# Patient Record
Sex: Female | Born: 1945 | Race: Black or African American | Hispanic: No | Marital: Married | Smoking: Never smoker
Health system: Southern US, Community
[De-identification: ages and names within clinical notes are randomized; demographics above are authoritative.]

## PROBLEM LIST (undated history)

## (undated) ENCOUNTER — Emergency Department (HOSPITAL_COMMUNITY): Discharge status: Absent without leave | Payer: Medicare Other

## (undated) DIAGNOSIS — R413 Other amnesia: Secondary | ICD-10-CM

## (undated) DIAGNOSIS — F0280 Dementia in other diseases classified elsewhere without behavioral disturbance: Secondary | ICD-10-CM

## (undated) DIAGNOSIS — N189 Chronic kidney disease, unspecified: Secondary | ICD-10-CM

## (undated) DIAGNOSIS — I82411 Acute embolism and thrombosis of right femoral vein: Secondary | ICD-10-CM

## (undated) DIAGNOSIS — R51 Headache: Secondary | ICD-10-CM

## (undated) DIAGNOSIS — F0390 Unspecified dementia without behavioral disturbance: Secondary | ICD-10-CM

## (undated) DIAGNOSIS — I2699 Other pulmonary embolism without acute cor pulmonale: Secondary | ICD-10-CM

## (undated) DIAGNOSIS — G309 Alzheimer's disease, unspecified: Secondary | ICD-10-CM

## (undated) HISTORY — DX: Other pulmonary embolism without acute cor pulmonale: I26.99

## (undated) HISTORY — DX: Alzheimer's disease, unspecified: G30.9

## (undated) HISTORY — DX: Headache: R51

## (undated) HISTORY — DX: Dementia in other diseases classified elsewhere without behavioral disturbance: F02.80

## (undated) HISTORY — DX: Unspecified dementia, unspecified severity, without behavioral disturbance, psychotic disturbance, mood disturbance, and anxiety: F03.90

## (undated) HISTORY — DX: Other amnesia: R41.3

## (undated) HISTORY — DX: Acute embolism and thrombosis of right femoral vein: I82.411

---

## 1997-05-19 ENCOUNTER — Other Ambulatory Visit: Admission: RE | Admit: 1997-05-19 | Discharge: 1997-05-19 | Payer: Self-pay | Admitting: *Deleted

## 1997-05-23 ENCOUNTER — Encounter: Admission: RE | Admit: 1997-05-23 | Discharge: 1997-08-21 | Payer: Self-pay | Admitting: *Deleted

## 1998-01-12 ENCOUNTER — Encounter: Payer: Self-pay | Admitting: Emergency Medicine

## 1998-01-12 ENCOUNTER — Emergency Department (HOSPITAL_COMMUNITY): Admission: EM | Admit: 1998-01-12 | Discharge: 1998-01-12 | Payer: Self-pay | Admitting: Emergency Medicine

## 1999-04-02 ENCOUNTER — Emergency Department (HOSPITAL_COMMUNITY): Admission: EM | Admit: 1999-04-02 | Discharge: 1999-04-02 | Payer: Self-pay

## 1999-07-18 ENCOUNTER — Other Ambulatory Visit: Admission: RE | Admit: 1999-07-18 | Discharge: 1999-07-18 | Payer: Self-pay | Admitting: *Deleted

## 2000-03-25 ENCOUNTER — Emergency Department (HOSPITAL_COMMUNITY): Admission: EM | Admit: 2000-03-25 | Discharge: 2000-03-25 | Payer: Self-pay | Admitting: Emergency Medicine

## 2000-03-25 ENCOUNTER — Encounter: Payer: Self-pay | Admitting: Emergency Medicine

## 2010-02-03 ENCOUNTER — Encounter: Payer: Self-pay | Admitting: Internal Medicine

## 2013-05-03 ENCOUNTER — Ambulatory Visit (INDEPENDENT_AMBULATORY_CARE_PROVIDER_SITE_OTHER): Payer: 59 | Admitting: Family Medicine

## 2013-05-03 VITALS — BP 124/82 | HR 66 | Temp 98.0°F | Resp 18 | Ht 68.0 in | Wt 189.0 lb

## 2013-05-03 DIAGNOSIS — H543 Unqualified visual loss, both eyes: Secondary | ICD-10-CM

## 2013-05-03 NOTE — Patient Instructions (Signed)
There are 2 optometry shops in [place]- there is also an Haematologist at the [facility] on [address].  I hope this helps.

## 2013-05-03 NOTE — Progress Notes (Signed)
[facility] [address] [place] [address] [phone]  Date:  05/03/2013   Name:  Tammy [name]   DOB:  08-22-1946   MRN:  [id number]  PCP:  No primary provider on file.    Chief Complaint: Advice Only   History of Present Illness:  Tammy [name] is a 68 y.o. very pleasant female patient who presents with the following:  Tammy [name] is here today just because she lost her glasses and does not know where to get new ones. She has no other complaint today and does not have any pain.  Her husband drove her here today  There are no active problems to display for this patient.   History reviewed. No pertinent past medical history.  History reviewed. No pertinent past surgical history.  History  Substance Use Topics  . Smoking status: Never Smoker   . Smokeless tobacco: Not on file  . Alcohol Use: No    History reviewed. No pertinent family history.  No Known Allergies  Medication list has been reviewed and updated.  No current outpatient prescriptions on file prior to visit.   No current facility-administered medications on file prior to visit.    Review of Systems:  As per HPI- otherwise negative.   Physical Examination: Filed Vitals:   05/03/13 1559  BP: 124/82  Pulse: 66  Temp: 98 F (36.7 C)  Resp: 18   Filed Vitals:   05/03/13 1559  Height: 5\' 8"  (1.727 m)  Weight: 189 lb (85.73 kg)   Body mass index is 28.74 kg/(m^2). Ideal Body Weight: Weight in (lb) to have BMI = 25: 164.1   GEN: WDWN, NAD, Non-toxic, Alert & Oriented x 3 HEENT: Atraumatic, Normocephalic.  Ears and Nose: No external deformity. EXTR: No clubbing/cyanosis/edema NEURO: Normal gait.  PSYCH: Normally interactive. Conversant. Not depressed or anxious appearing.  Calm demeanor.    Assessment and Plan: Low vision, both eyes  Gave some information about optic shops close to her home.  No charge for this visit today  Signed Lamar Blinks, MD

## 2013-05-08 ENCOUNTER — Ambulatory Visit (INDEPENDENT_AMBULATORY_CARE_PROVIDER_SITE_OTHER): Payer: 59 | Admitting: Physician Assistant

## 2013-05-08 VITALS — BP 138/88 | HR 66 | Temp 98.0°F | Resp 16 | Ht 66.0 in | Wt 186.0 lb

## 2013-05-08 DIAGNOSIS — R42 Dizziness and giddiness: Secondary | ICD-10-CM

## 2013-05-08 NOTE — Progress Notes (Signed)
   Subjective:    Patient ID: Tammy [name], female    DOB: 08/14/1946, 68 y.o.   MRN: [id number]  HPI   68y.o female with no medical or surgical hx presents feeling off balance for 1 week.  Pt describes walking yesterday and she veered to the right without intention.  This has been happening on and off for past week.  She denies feeling dizzy or lightheaded when this happens.  Denies blurry or double vision, changes in acuity, tinnitus, changes in hearing, vertigo, congestion, sinus pressure.  Has had headaches on and off but this has improved since getting new glasses.  Denies feeling weakness, numbness, tingling in any extremities this past week.  This has never happened to her before.  She works on the [employer] and has been stressed by staff working under her.  Denies fever, N/V/D, blood in stool or urine, dysuria, unexpected weight change.  Denies hx of arrythmia or any cardiac hx  Review of Systems  Constitutional: Negative for fever, chills, fatigue and unexpected weight change.  HENT: Negative for congestion, ear pain, hearing loss, postnasal drip, rhinorrhea, sinus pressure and tinnitus.   Eyes: Negative for visual disturbance.  Respiratory: Negative for cough, shortness of breath and wheezing.   Cardiovascular: Negative for chest pain.  Gastrointestinal: Negative for nausea, vomiting, abdominal pain, diarrhea and blood in stool.  Endocrine: Negative.   Genitourinary: Negative.   Musculoskeletal: Positive for gait problem.  Skin: Negative for rash.  Allergic/Immunologic: Negative.   Neurological: Negative for dizziness, speech difficulty, weakness, light-headedness and numbness.  Hematological: Negative.        Objective:   Physical Exam  Constitutional: She is oriented to person, place, and time. She appears well-developed and well-nourished. No distress.  BP 138/88  Pulse 66  Temp(Src) 98 F (36.7 C) (Oral)  Resp 16  Ht 5\' 6"  (1.676 m)  Wt 186 lb  (84.369 kg)  BMI 30.04 kg/m2  SpO2 98%   HENT:  Head: Normocephalic.  Right Ear: External ear normal.  Left Ear: External ear normal.  Eyes: Conjunctivae and EOM are normal. Pupils are equal, round, and reactive to light.  Neck: Normal range of motion. No JVD present.  Cardiovascular: Normal rate, regular rhythm, normal heart sounds and intact distal pulses.  Exam reveals no gallop and no friction rub.   No murmur heard. Pulmonary/Chest: Effort normal and breath sounds normal. No respiratory distress. She has no wheezes.  Lymphadenopathy:    She has no cervical adenopathy.  Neurological: She is alert and oriented to person, place, and time. She has normal strength and normal reflexes. She displays no tremor and normal reflexes. No cranial nerve deficit or sensory deficit. She displays a negative Romberg sign. Coordination and gait normal.  Skin: Skin is warm and dry. No rash noted.  Psychiatric: She has a normal mood and affect. Her behavior is normal.          Assessment & Plan:   1. Disequilibrium Pt will call or seek emergent medical attention if symptoms worsen, or new neurological symptoms develop prior to follow up with Neurologist. - Ambulatory referral to Neurology

## 2013-05-08 NOTE — Patient Instructions (Signed)
We placed a referral for you to see a neurologist.  If you do not hear from [facility] within the next 3 days, give [place] a call.  If symptoms get worse or if you develop new symptoms like dizziness or changes in vision, call us immediately or go to the Emergency room.

## 2013-05-08 NOTE — Progress Notes (Signed)
I have examined this patient along with the student and agree. Discussed case with Dr. Tamala Julian.

## 2013-05-16 ENCOUNTER — Telehealth: Payer: Self-pay | Admitting: *Deleted

## 2013-05-16 NOTE — Telephone Encounter (Signed)
Left message on patient's vm to r/s appointment with Dr. Janann Colonel.

## 2013-05-17 ENCOUNTER — Ambulatory Visit: Payer: 59 | Admitting: Neurology

## 2013-05-23 ENCOUNTER — Ambulatory Visit: Payer: 59 | Admitting: Neurology

## 2013-06-01 ENCOUNTER — Encounter: Payer: Self-pay | Admitting: Neurology

## 2013-06-01 ENCOUNTER — Ambulatory Visit (INDEPENDENT_AMBULATORY_CARE_PROVIDER_SITE_OTHER): Payer: Commercial Managed Care - PPO | Admitting: Neurology

## 2013-06-01 VITALS — BP 121/76 | HR 74 | Ht 66.0 in | Wt 188.0 lb

## 2013-06-01 DIAGNOSIS — R269 Unspecified abnormalities of gait and mobility: Secondary | ICD-10-CM

## 2013-06-01 DIAGNOSIS — R2681 Unsteadiness on feet: Secondary | ICD-10-CM

## 2013-06-01 DIAGNOSIS — G609 Hereditary and idiopathic neuropathy, unspecified: Secondary | ICD-10-CM

## 2013-06-01 NOTE — Progress Notes (Signed)
[facility]    Provider:  Dr Janann Colonel Referring Provider: No ref. provider found Primary Care Physician:  No primary provider on file.  CC:  Difficulty walking  HPI:  Tammy [name] is a 68 y.o. female here as a referral from Dr Jacqulynn Cadet for difficulty walking. Notes symptoms started around 4-6 months ago. Symptoms came on suddenly, notes that when she walks she will veer to the right. She notes feeling off balance and unsteady. Husband notes she has had some falls. She notes episodes of vertigo described as the wall moving. Notes the episodes come and go. Is not triggered by head movements a certain way. No nausea or emesis. No focal motor or sensory changes. Notes some difficulty with word finding. Denies any dysarthria. No difficulty with eating or swallowing food.   Denies any HTN, DM, HLD. No prior TIA or stroke. Notes a family history of strokes.   Review of Systems: Out of a complete 14 system review, the patient complains of only the following symptoms, and all other reviewed systems are negative. Denies any + ROS  History   Social History  . Marital Status: Married    Spouse Name: N/A    Number of Children: N/A  . Years of Education: N/A   Occupational History  . Not on file.   Social History Main Topics  . Smoking status: Never Smoker   . Smokeless tobacco: Never Used  . Alcohol Use: No  . Drug Use: No  . Sexual Activity: Not on file   Other Topics Concern  . Not on file   Social History Narrative   Married with 3 children   Right handed   8th grade   1 cup daily    No family history on file.  No past medical history on file.  No past surgical history on file.  No current outpatient prescriptions on file.   No current facility-administered medications for this visit.    Allergies as of 06/01/2013 - Review Complete 06/01/2013  Allergen Reaction Noted  . Sulfa antibiotics Palpitations 05/08/2013    Vitals: BP 121/76  Pulse 74   Ht 5\' 6"  (1.676 m)  Wt 188 lb (85.276 kg)  BMI 30.36 kg/m2 Last Weight:  Wt Readings from Last 1 Encounters:  06/01/13 188 lb (85.276 kg)   Last Height:   Ht Readings from Last 1 Encounters:  06/01/13 5\' 6"  (1.676 m)     Physical exam: Exam: Gen: NAD, conversant Eyes: anicteric sclerae, moist conjunctivae HENT: Atraumatic, oropharynx clear Neck: Trachea midline; supple,  Lungs: CTA, no wheezing, rales, rhonic                          CV: RRR, no MRG Abdomen: Soft, non-tender;  Extremities: No peripheral edema  Skin: Normal temperature, no rash,  Psych: Appropriate affect, pleasant  Neuro: MS: AA&Ox3, appropriately interactive, normal affect   Speech: fluent w/o paraphasic error  Memory: good recent and remote recall  CN: PERRL, EOMI no nystagmus, no ptosis, sensation intact to LT V1-V3 bilat, face symmetric, no weakness, hearing grossly intact, palate elevates symmetrically, shoulder shrug 5/5 bilat,  tongue protrudes midline, no fasiculations noted.  Motor: normal bulk and tone Strength: 5/5  In all extremities  Coord: rapid alternating and point-to-point (FNF, HTS) movements intact.  Reflexes: symmetrical, bilat downgoing toes  Sens: LT intact in all extremities, decreased vibration bilateral LE  Gait: slightly wide based, unsteady, unable to tandem, wobbles but does  not fall with eyes closed   Assessment:  After physical and neurologic examination, review of laboratory studies, imaging, neurophysiology testing and pre-existing records, assessment will be reviewed on the problem list.  Plan:  Treatment plan and additional workup will be reviewed under Problem List.  1)Gait instability 2)peripheral neuropathy  68y/o woman presenting for initial evaluation of 3-4 months of gait instability. Unclear etiology, physical exam does show some signs of a peripheral neuropathy. With acute onset would have concern this may represent a CVA. Will check brain MRI/A, lab  workup for signs of peripheral neuropathy. Follow up once workup completed.   Jim Like, DO  [facility] [address] [place], [place] [address]  Phone [phone] Fax [phone]

## 2013-06-01 NOTE — Patient Instructions (Signed)
Overall you are doing fairly well but I do want to suggest a few things today:   Remember to drink plenty of fluid, eat healthy meals and do not skip any meals. Try to eat protein with a every meal and eat a healthy snack such as fruit or nuts in between meals. Try to keep a regular sleep-wake schedule and try to exercise daily, particularly in the form of walking, 20-30 minutes a day, if you can.   As far as diagnostic testing:  1)Please have some blood work done after checking out 2)I would like you to have a MRI of the brain, you will be called to schedule this  Follow up once the MRI is completed. Please call us with any interim questions, concerns, problems, updates or refill requests.   My clinical assistant and will answer any of your questions and relay your messages to me and also relay most of my messages to you.   Our phone number is [phone]. We also have an after hours call service for urgent matters and there is a physician on-call for urgent questions. For any emergencies you know to call 911 or go to the nearest emergency room

## 2013-06-02 ENCOUNTER — Other Ambulatory Visit (INDEPENDENT_AMBULATORY_CARE_PROVIDER_SITE_OTHER): Payer: Self-pay

## 2013-06-02 ENCOUNTER — Ambulatory Visit (INDEPENDENT_AMBULATORY_CARE_PROVIDER_SITE_OTHER): Payer: Commercial Managed Care - PPO

## 2013-06-02 DIAGNOSIS — Z0289 Encounter for other administrative examinations: Secondary | ICD-10-CM

## 2013-06-02 DIAGNOSIS — R269 Unspecified abnormalities of gait and mobility: Secondary | ICD-10-CM

## 2013-06-02 DIAGNOSIS — R2681 Unsteadiness on feet: Secondary | ICD-10-CM

## 2013-06-02 DIAGNOSIS — G609 Hereditary and idiopathic neuropathy, unspecified: Secondary | ICD-10-CM

## 2013-06-03 ENCOUNTER — Other Ambulatory Visit: Payer: Self-pay | Admitting: Neurology

## 2013-06-03 MED ORDER — ASPIRIN EC 81 MG PO TBEC: 81.0000 mg | DELAYED_RELEASE_TABLET | Freq: Every day | ORAL | Status: DC

## 2013-06-03 MED ORDER — ROSUVASTATIN CALCIUM 20 MG PO TABS: 20.0000 mg | ORAL_TABLET | Freq: Every day | ORAL | Status: DC

## 2013-06-03 NOTE — Progress Notes (Signed)
Quick Note:  Spoke to husband and relayed cholesterol elevated and Dr. Janann Colonel wants her to take Crestor 20mg  nightly and also take a daily aspirin 81mg , per Dr. Janann Colonel. ______

## 2013-06-05 LAB — LIPID PANEL WITH LDL/HDL RATIO
Cholesterol, Total: 186 mg/dL (ref 100–199)
HDL: 69 mg/dL (ref >39)
LDL Calculated: 108 mg/dL — ABNORMAL HIGH (ref 0–99)
LDl/HDL Ratio: 1.6 ratio units (ref 0.0–3.2)
Triglycerides: 43 mg/dL (ref 0–149)
VLDL Cholesterol Cal: 9 mg/dL (ref 5–40)

## 2013-06-05 LAB — VITAMIN B12: Vitamin B-12: 371 pg/mL (ref 211–946)

## 2013-06-05 LAB — HGB A1C W/O EAG: Hgb A1c MFr Bld: 6 % — ABNORMAL HIGH (ref 4.8–5.6)

## 2013-06-05 LAB — METHYLMALONIC ACID, SERUM: Methylmalonic Acid: 129 nmol/L (ref 0–378)

## 2013-06-07 ENCOUNTER — Other Ambulatory Visit: Payer: Self-pay | Admitting: Neurology

## 2013-06-07 DIAGNOSIS — R42 Dizziness and giddiness: Secondary | ICD-10-CM

## 2013-06-08 NOTE — Progress Notes (Signed)
Quick Note:  Spoke with patient informed her of normal MRI, and to follow up with vestibular rehab to work on her balance, patient expressed understanding and states she will call them to schedule an appointment. ______

## 2013-06-09 ENCOUNTER — Ambulatory Visit: Payer: Commercial Managed Care - PPO | Attending: Neurology | Admitting: Physical Therapy

## 2013-06-09 ENCOUNTER — Ambulatory Visit: Payer: Commercial Managed Care - PPO | Admitting: Physical Therapy

## 2013-06-09 DIAGNOSIS — IMO0001 Reserved for inherently not codable concepts without codable children: Secondary | ICD-10-CM | POA: Insufficient documentation

## 2013-06-09 DIAGNOSIS — M6281 Muscle weakness (generalized): Secondary | ICD-10-CM | POA: Diagnosis not present

## 2013-06-09 DIAGNOSIS — R42 Dizziness and giddiness: Secondary | ICD-10-CM | POA: Diagnosis not present

## 2013-06-09 DIAGNOSIS — R269 Unspecified abnormalities of gait and mobility: Secondary | ICD-10-CM | POA: Insufficient documentation

## 2014-12-19 ENCOUNTER — Ambulatory Visit (INDEPENDENT_AMBULATORY_CARE_PROVIDER_SITE_OTHER): Payer: Medicare Other | Admitting: Family Medicine

## 2014-12-19 VITALS — BP 112/82 | HR 72 | Temp 98.1°F | Resp 16 | Ht 66.0 in | Wt 178.2 lb

## 2014-12-19 DIAGNOSIS — E538 Deficiency of other specified B group vitamins: Secondary | ICD-10-CM

## 2014-12-19 DIAGNOSIS — G44219 Episodic tension-type headache, not intractable: Secondary | ICD-10-CM

## 2014-12-19 DIAGNOSIS — Z23 Encounter for immunization: Secondary | ICD-10-CM

## 2014-12-19 DIAGNOSIS — R7303 Prediabetes: Secondary | ICD-10-CM | POA: Diagnosis not present

## 2014-12-19 DIAGNOSIS — R413 Other amnesia: Secondary | ICD-10-CM

## 2014-12-19 DIAGNOSIS — E559 Vitamin D deficiency, unspecified: Secondary | ICD-10-CM

## 2014-12-19 LAB — COMPREHENSIVE METABOLIC PANEL
ALT: 14 U/L (ref 6–29)
AST: 16 U/L (ref 10–35)
Albumin: 4.1 g/dL (ref 3.6–5.1)
Alkaline Phosphatase: 54 U/L (ref 33–130)
BUN: 18 mg/dL (ref 7–25)
CO2: 27 mmol/L (ref 20–31)
Calcium: 9.6 mg/dL (ref 8.6–10.4)
Chloride: 108 mmol/L (ref 98–110)
Creat: 0.63 mg/dL (ref 0.50–0.99)
GLUCOSE: 79 mg/dL (ref 65–99)
Potassium: 3.8 mmol/L (ref 3.5–5.3)
Sodium: 145 mmol/L (ref 135–146)
Total Bilirubin: 0.4 mg/dL (ref 0.2–1.2)
Total Protein: 7.3 g/dL (ref 6.1–8.1)

## 2014-12-19 LAB — POCT GLYCOSYLATED HEMOGLOBIN (HGB A1C): Hemoglobin A1C: 5.7

## 2014-12-19 LAB — HEMOGLOBIN A1C: Hgb A1c MFr Bld: 5.7 % (ref 4.0–6.0)

## 2014-12-19 LAB — CBC
HCT: 39 % (ref 36.0–46.0)
Hemoglobin: 12.7 g/dL (ref 12.0–15.0)
MCH: 28.2 pg (ref 26.0–34.0)
MCHC: 32.6 g/dL (ref 30.0–36.0)
MCV: 86.7 fL (ref 78.0–100.0)
MPV: 9.6 fL (ref 8.6–12.4)
PLATELETS: 312 10*3/uL (ref 150–400)
RBC: 4.5 MIL/uL (ref 3.87–5.11)
RDW: 14.1 % (ref 11.5–15.5)
WBC: 6.3 10*3/uL (ref 4.0–10.5)

## 2014-12-19 MED ORDER — CYANOCOBALAMIN 1000 MCG/ML IJ SOLN
1000.0000 ug | Freq: Once | INTRAMUSCULAR | Status: AC
  Administered 2014-12-19: 1000 ug via INTRAMUSCULAR

## 2014-12-19 NOTE — Patient Instructions (Signed)
Management of Memory Problems  There are some general things you can do to help manage your memory problems.  Your memory may not in fact recover, but by using techniques and strategies you will be able to manage your memory difficulties better.  1)  Establish a routine.  Try to establish and then stick to a regular routine.  By doing this, you will get used to what to expect and you will reduce the need to rely on your memory.  Also, try to do things at the same time of day, such as taking your medication or checking your calendar first thing in the morning.  Think about think that you can do as a part of a regular routine and make a list.  Then enter them into a daily planner to remind you.  This will help you establish a routine.  2)  Organize your environment.  Organize your environment so that it is uncluttered.  Decrease visual stimulation.  Place everyday items such as keys or cell phone in the same place every day (ie.  Basket next to front door)  Use post it notes with a brief message to yourself (ie. Turn off light, lock the door)  Use labels to indicate where things go (ie. Which cupboards are for food, dishes, etc.)  Keep a notepad and pen by the telephone to take messages  3)  Memory Aids  A diary or journal/notebook/daily planner  Making a list (shopping list, chore list, to do list that needs to be done)  Using an alarm as a reminder (kitchen timer or cell phone alarm)  Using cell phone to store information (Notes, Calendar, Reminders)  Calendar/White board placed in a prominent position  Post-it notes  In order for memory aids to be useful, you need to have good habits.  It's no good remembering to make a note in your journal if you don't remember to look in it.  Try setting aside a certain time of day to look in journal.  4)  Improving mood and managing fatigue.  There may be other factors that contribute to memory difficulties.  Factors, such as anxiety,  depression and tiredness can affect memory.  Regular gentle exercise can help improve your mood and give you more energy.  Simple relaxation techniques may help relieve symptoms of anxiety  Try to get back to completing activities or hobbies you enjoyed doing in the past.  Learn to pace yourself through activities to decrease fatigue.  Find out about some local support groups where you can share experiences with others.  Try and achieve 7-8 hours of sleep at night.  Memory Compensation Strategies  2. Use "WARM" strategy.  W= write it down  A= associate it  R= repeat it  M= make a mental note  2.   You can keep a Memory Notebook.  Use a 3-ring notebook with sections for the following: calendar, important names and phone numbers,  medications, doctors' names/phone numbers, lists/reminders, and a section to journal what you did  each day.   3.    Use a calendar to write appointments down.  4.    Write yourself a schedule for the day.  This can be placed on the calendar or in a separate section of the Memory Notebook.  Keeping a  regular schedule can help memory.  5.    Use medication organizer with sections for each day or morning/evening pills.  You may need help loading it  6.      Keep a basket, or pegboard by the door.  Place items that you need to take out with you in the basket or on the pegboard.  You may also want to  include a message board for reminders.  7.    Use sticky notes.  Place sticky notes with reminders in a place where the task is performed.  For example: " turn off the  stove" placed by the stove, "lock the door" placed on the door at eye level, " take your medications" on  the bathroom mirror or by the place where you normally take your medications.  8.    Use alarms/timers.  Use while cooking to remind yourself to check on food or as a reminder to take your medicine, or as a  reminder to make a call, or as a reminder to perform another task, etc.  

## 2014-12-19 NOTE — Progress Notes (Signed)
Subjective:    Patient ID: Tammy [name], female    DOB: 08/14/1946, 69 y.o.   MRN: [id number] By signing my name below, I, Tammy [name], attest that this documentation has been prepared under the direction and in the presence of Delman Cheadle, MD.  Electronically Signed: Zola [name], Medical Scribe. 12/19/2014. 12:20 PM.  Chief Complaint  Patient presents with  . Headache  . Flu Vaccine    HPI HPI Comments: Linnell AAFIYA [name] is a 69 y.o. female who [facility] complaining of intermittent headaches to her bilateral temples. Patient attributes the headaches to working too much and states the headaches tend to occur towards the end of the day, after work. (However, she no longer works as she retired in August). She has been taking ibuprofen or Tylenol about 2-3 times a week which I I does provide relief. She reports having associated lightheadedness.   Spouse notes that she has had noticeable memory changes, often losing or misplacing things. He sometimes finds things in odd places. When trying to obtain a hx about HA from pt - pt keeps answering as if she were working still which she ha not for 4 mos - when asked what she does when she has a HA she states that she will tell her boss and take a rest from her job.  When asked how she currently spends her time, pt reports how hard her bosses make her work every day - that she has no breaks/downtime.  She states that her HAs usually ocur at the end of a work day (but no work x 4 mos though pt still having HAs currently.) Pt really unable to provide any current details about her life now. Is tangential and many answers she gives aren't related to the question I asked.  Patient does have an eye doctor at [facility]. She wears glasses and has not had any problems with her vision. She is supposesd to wear them everyday but she forgot them at home today.  Patient denies balance problems, bowel problems, nocturia, other urinary problems,  and seasonal allergies. She denies getting lost, recent falls and mood problems. She still drives a car. Patient states she drinks plenty of water, and she eats and sleeps relatively well. She had an MRI of her brain last year due to balance issues. She did not need to follow-up with neurology at that time. Her husband would like her to see a neurologist. She is amenable to seeing Dr. Janann Colonel, who she saw last year. Patient has allergies to sulfa.  Patient recently retired in August.  History reviewed. No pertinent past medical history. History reviewed. No pertinent past surgical history. Current Outpatient Prescriptions on File Prior to Visit  Medication Sig Dispense Refill  . aspirin EC 81 MG tablet Take 1 tablet (81 mg total) by mouth daily.     No current facility-administered medications on file prior to visit.   Allergies  Allergen Reactions  . Sulfa Antibiotics Palpitations   Family History  Problem Relation Age of Onset  . Heart disease Brother    Social History   Social History  . Marital Status: Married    Spouse Name: N/A  . Number of Children: N/A  . Years of Education: N/A   Social History Main Topics  . Smoking status: Never Smoker   . Smokeless tobacco: Never Used  . Alcohol Use: No  . Drug Use: No  . Sexual Activity: Not Asked  Other Topics Concern  . None   Social History Narrative   Married with 3 children   Right handed   8th grade   1 cup daily     Review of Systems  Eyes: Negative for visual disturbance.  Gastrointestinal: Negative.   Genitourinary: Negative.  Negative for urgency, frequency and enuresis.  Musculoskeletal: Negative for gait problem.  Allergic/Immunologic: Negative for environmental allergies.  Neurological: Positive for light-headedness and headaches.  Psychiatric/Behavioral: Negative for sleep disturbance and dysphoric mood. The patient is not nervous/anxious.        Objective:  BP 112/82 mmHg  Pulse 72  Temp(Src)  98.1 F (36.7 C) (Oral)  Resp 16  Ht 5\' 6"  (1.676 m)  Wt 178 lb 3.2 oz (80.831 kg)  BMI 28.78 kg/m2  SpO2 98%  Physical Exam  Constitutional: She appears well-developed and well-nourished. No distress.  HENT:  Head: Normocephalic and atraumatic.  Mouth/Throat: Oropharynx is clear and moist. No oropharyngeal exudate.  Eyes: Pupils are equal, round, and reactive to light.  Neck: Neck supple. No thyromegaly present.  Thyroid normal.  Cardiovascular: Normal rate, regular rhythm, S1 normal, S2 normal and normal heart sounds.   No murmur heard. Pulmonary/Chest: Effort normal and breath sounds normal. No respiratory distress. She has no wheezes. She has no rales.  Clear to auscultation bilaterally.   Musculoskeletal: She exhibits no edema.  Neurological: She is alert. She is not disoriented. She displays no tremor. No cranial nerve deficit. Gait normal.  Skin: Skin is warm and dry. No rash noted.  Psychiatric: She has a normal mood and affect. Her speech is normal and behavior is normal. Judgment and thought content normal. Cognition and memory are impaired. She exhibits abnormal recent memory. She exhibits normal remote memory.  Nursing note and vitals reviewed.     Results for orders placed or performed in visit on 12/19/14  CBC  Result Value Ref Range   WBC 6.3 4.0 - 10.5 K/uL   RBC 4.50 3.87 - 5.11 MIL/uL   Hemoglobin 12.7 12.0 - 15.0 g/dL   HCT 39.0 36.0 - 46.0 %   MCV 86.7 78.0 - 100.0 fL   MCH 28.2 26.0 - 34.0 pg   MCHC 32.6 30.0 - 36.0 g/dL   RDW 14.1 11.5 - 15.5 %   Platelets 312 150 - 400 K/uL   MPV 9.6 8.6 - 12.4 fL  TSH  Result Value Ref Range   TSH 0.746 0.350 - 4.500 uIU/mL  Vitamin B12  Result Value Ref Range   Vitamin B-12 356 211 - 911 pg/mL  Comprehensive metabolic panel  Result Value Ref Range   Sodium 145 135 - 146 mmol/L   Potassium 3.8 3.5 - 5.3 mmol/L   Chloride 108 98 - 110 mmol/L   CO2 27 20 - 31 mmol/L   Glucose, Bld 79 65 - 99 mg/dL   BUN 18 7 -  25 mg/dL   Creat 0.63 0.50 - 0.99 mg/dL   Total Bilirubin 0.4 0.2 - 1.2 mg/dL   Alkaline Phosphatase 54 33 - 130 U/L   AST 16 10 - 35 U/L   ALT 14 6 - 29 U/L   Total Protein 7.3 6.1 - 8.1 g/dL   Albumin 4.1 3.6 - 5.1 g/dL   Calcium 9.6 8.6 - 10.4 mg/dL  VITAMIN D 25 Hydroxy (Vit-D Deficiency, Fractures)  Result Value Ref Range   Vit D, 25-Hydroxy  30 - 100 ng/mL  Ferritin  Result Value Ref Range   Ferritin 128  10 - 291 ng/mL  RPR  Result Value Ref Range   RPR Ser Ql  NON REAC  POCT glycosylated hemoglobin (Hb A1C)  Result Value Ref Range   Hemoglobin A1C 5.7     Assessment & Plan:   1. Memory difficulties - definitely notable on attempting to obtain hx from pt even far before husband reported he was concerned about her memory and that req for neurology referral was actual reason for visit - I agree that pt's sxs seems to be fairly rapidly progressive at a relatively young age so would benefit from neuro eval and cons brain MRI.  2. Vitamin B12 deficiency  - borderline low prior at 300s with nml mma - therapeutic trial of IM inj x 1 today - if gets sig improvement, would rec starting sublingual supp in 1 mos.  3. Prediabetes - a1c 6.0 -> 5.7 today  4. Episodic tension-type headache, not intractable  - sounds MSK - suspect pt is not wearing her glasses enough (poss due to memory problems)  5. Need for prophylactic vaccination and inoculation against influenza     Orders Placed This Encounter  Procedures  . Flu Vaccine QUAD 36+ mos IM  . CBC  . TSH  . Vitamin B12  . Comprehensive metabolic panel  . VITAMIN D 25 Hydroxy (Vit-D Deficiency, Fractures)  . Ferritin  . RPR  . Ambulatory referral to Neurology    Referral Priority:  Routine    Referral Type:  Consultation    Referral Reason:  Specialty Services Required    Requested Specialty:  Neurology    Number of Visits Requested:  1  . POCT glycosylated hemoglobin (Hb A1C)    Meds ordered this encounter  Medications  .  cyanocobalamin ((VITAMIN B-12)) injection 1,000 mcg    Sig:     I personally performed the services described in this documentation, which was scribed in my presence. The recorded information has been reviewed and considered, and addended by me as needed.  Delman Cheadle, MD MPH

## 2014-12-20 LAB — VITAMIN B12: VITAMIN B 12: 356 pg/mL (ref 211–911)

## 2014-12-20 LAB — VITAMIN D 25 HYDROXY (VIT D DEFICIENCY, FRACTURES): Vit D, 25-Hydroxy: 10 ng/mL — ABNORMAL LOW (ref 30–100)

## 2014-12-20 LAB — FERRITIN: FERRITIN: 128 ng/mL (ref 10–291)

## 2014-12-20 LAB — TSH: TSH: 0.746 u[IU]/mL (ref 0.350–4.500)

## 2014-12-20 LAB — RPR

## 2014-12-28 ENCOUNTER — Ambulatory Visit (INDEPENDENT_AMBULATORY_CARE_PROVIDER_SITE_OTHER): Payer: Medicare Other | Admitting: Neurology

## 2014-12-28 ENCOUNTER — Encounter: Payer: Self-pay | Admitting: Family Medicine

## 2014-12-28 ENCOUNTER — Encounter: Payer: Self-pay | Admitting: Neurology

## 2014-12-28 VITALS — BP 149/93 | HR 69 | Ht 67.5 in | Wt 181.5 lb

## 2014-12-28 DIAGNOSIS — G309 Alzheimer's disease, unspecified: Secondary | ICD-10-CM

## 2014-12-28 DIAGNOSIS — R51 Headache: Secondary | ICD-10-CM | POA: Diagnosis not present

## 2014-12-28 DIAGNOSIS — R413 Other amnesia: Secondary | ICD-10-CM | POA: Insufficient documentation

## 2014-12-28 DIAGNOSIS — R519 Headache, unspecified: Secondary | ICD-10-CM

## 2014-12-28 DIAGNOSIS — G300 Alzheimer's disease with early onset: Secondary | ICD-10-CM | POA: Diagnosis not present

## 2014-12-28 DIAGNOSIS — F0280 Dementia in other diseases classified elsewhere without behavioral disturbance: Secondary | ICD-10-CM | POA: Insufficient documentation

## 2014-12-28 HISTORY — DX: Other amnesia: R41.3

## 2014-12-28 HISTORY — DX: Dementia in other diseases classified elsewhere, unspecified severity, without behavioral disturbance, psychotic disturbance, mood disturbance, and anxiety: F02.80

## 2014-12-28 HISTORY — DX: Headache, unspecified: R51.9

## 2014-12-28 NOTE — Progress Notes (Signed)
Reason for visit: Memory disturbance  Referring physician: Dr. Dominga Ferry Tammy [name] is a 69 y.o. female  History of present illness:  Tammy [name] is a 69 year old right-handed black female with a history of a progressive memory disturbance that her husband indicates began about 2 years ago, and has been gradually progressive. The patient worked until about August 2016. He indicates that she did not stop work because of the memory issues. She was having problems misplacing things about the house, but she also has some short-term memory problems, and difficulty with names. She has been having issues with driving, she will get turned around with directions. She is still cooking some, and doing well with this. The husband had to take over doing the finances two years ago as she was making errors in this regard. The patient was seen through this office about 18 months ago for problems with balance, but the patient indicates that she does not have issues with balance at this time. She denies any visual field disturbances, difficulty with control of the bowels or the bladder. She denies any fatigue, or any issues with depression. She is sleeping well at night. She has been in good health throughout her life. About one year ago, she began having headaches that are bitemporal in nature, these are occurring about 3 times a week. The patient will take some aspirin for the headache with good improvement. She has some blurring of vision with the headache, she denies any photophobia or phonophobia, nausea or vomiting with the headache. The patient denies any problems with confusion during the headache. She indicates that growing up she had severe headaches. The patient has had blood work done recently that included and RPR and B12 level that were unremarkable. The patient indicates that her mother also had memory issues before she died. The patient comes to this office for an evaluation.  Past Medical History    Diagnosis Date  . Memory disturbance 12/28/2014  . Headache disorder 12/28/2014  . Alzheimer's disease 12/28/2014    History reviewed. No pertinent past surgical history.  Family History  Problem Relation Age of Onset  . Heart disease Brother   . Heart attack Mother   . Dementia Mother     Social history:  reports that she has never smoked. She has never used smokeless tobacco. She reports that she does not drink alcohol or use illicit drugs.  Medications:  Prior to Admission medications   Not on File      Allergies  Allergen Reactions  . Sulfa Antibiotics Palpitations    ROS:  Out of a complete 14 system review of symptoms, the patient complains only of the following symptoms, and all other reviewed systems are negative.  Memory disturbance Headache  Blood pressure 149/93, pulse 69, height 5' 7.5" (1.715 m), weight 181 lb 8 oz (82.328 kg).  Physical Exam  General: The patient is alert and cooperative at the time of the examination.  Eyes: Pupils are equal, round, and reactive to light. Discs are flat bilaterally.  Neck: The neck is supple, no carotid bruits are noted.  Respiratory: The respiratory examination is clear.  Cardiovascular: The cardiovascular examination reveals a regular rate and rhythm, no obvious murmurs or rubs are noted.  Neuromuscular: Range of movement of the cervical spine was full.  Skin: Extremities are without significant edema.  Neurologic Exam  Mental status: The patient is alert and oriented x 2 at the time of the examination (not oriented to date).  The Mini-Mental Status Examination done today shows a total score of 14/30. The patient is able to name 5 animals in 30 seconds.  Cranial nerves: Facial symmetry is present. There is good sensation of the face to pinprick and soft touch bilaterally. The strength of the facial muscles and the muscles to head turning and shoulder shrug are normal bilaterally. Speech is well enunciated, no  aphasia or dysarthria is noted. Extraocular movements are full. Visual fields are full. The tongue is midline, and the patient has symmetric elevation of the soft palate. No obvious hearing deficits are noted.  Motor: The motor testing reveals 5 over 5 strength of all 4 extremities. Good symmetric motor tone is noted throughout.  Sensory: Sensory testing is intact to pinprick, soft touch, vibration sensation, and position sense on all 4 extremities. No evidence of extinction is noted.  Coordination: Cerebellar testing reveals good finger-nose-finger and heel-to-shin bilaterally.  Gait and station: Gait is normal. Tandem gait is normal. Romberg is negative. No drift is seen.  Reflexes: Deep tendon reflexes are symmetric and normal bilaterally. Toes are downgoing bilaterally.   Assessment/Plan:  1. Progressive memory disturbance  2. Headache  The patient likely had migraine headache when she was growing up, the headaches have come back on within the last one year. The patient will be sent for a sedimentation rate and a C-reactive protein. The patient has developed a moderate level of dementia, it appears that the memory issues have been going on at least 2 years, the patient likely has Alzheimer's disease. We will check MRI of the brain, if the workup is unremarkable we will consider adding medication such as Aricept. The patient will follow-up in 3 months.  Jill Alexanders MD 12/28/2014 7:57 PM  [facility] [address] [place], [place] [address]  Phone [phone] Fax [phone]

## 2014-12-28 NOTE — Patient Instructions (Addendum)
   We will check MRI of the brain and get some blood work today.  General Headache Without Cause A headache is pain or discomfort felt around the head or neck area. The specific cause of a headache may not be found. There are many causes and types of headaches. A few common ones are:  Tension headaches.  Migraine headaches.  Cluster headaches.  Chronic daily headaches. HOME CARE INSTRUCTIONS  Watch your condition for any changes. Take these steps to help with your condition: Managing Pain  Take over-the-counter and prescription medicines only as told by your health care provider.  Lie down in a dark, quiet room when you have a headache.  If directed, apply ice to the head and neck area:  Put ice in a plastic bag.  Place a towel between your skin and the bag.  Leave the ice on for 20 minutes, 2-3 times per day.  Use a heating pad or hot shower to apply heat to the head and neck area as told by your health care provider.  Keep lights dim if bright lights bother you or make your headaches worse. Eating and Drinking  Eat meals on a regular schedule.  Limit alcohol use.  Decrease the amount of caffeine you drink, or stop drinking caffeine. General Instructions  Keep all follow-up visits as told by your health care provider. This is important.  Keep a headache journal to help find out what may trigger your headaches. For example, write down:  What you eat and drink.  How much sleep you get.  Any change to your diet or medicines.  Try massage or other relaxation techniques.  Limit stress.  Sit up straight, and do not tense your muscles.  Do not use tobacco products, including cigarettes, chewing tobacco, or e-cigarettes. If you need help quitting, ask your health care provider.  Exercise regularly as told by your health care provider.  Sleep on a regular schedule. Get 7-9 hours of sleep, or the amount recommended by your health care provider. SEEK MEDICAL CARE  IF:   Your symptoms are not helped by medicine.  You have a headache that is different from the usual headache.  You have nausea or you vomit.  You have a fever. SEEK IMMEDIATE MEDICAL CARE IF:   Your headache becomes severe.  You have repeated vomiting.  You have a stiff neck.  You have a loss of vision.  You have problems with speech.  You have pain in the eye or ear.  You have muscular weakness or loss of muscle control.  You lose your balance or have trouble walking.  You feel faint or pass out.  You have confusion.   This information is not intended to replace advice given to you by your health care provider. Make sure you discuss any questions you have with your health care provider.   Document Released: 12/30/2004 Document Revised: 09/20/2014 Document Reviewed: 04/24/2014 Elsevier Interactive Patient Education [employer].

## 2014-12-29 LAB — SEDIMENTATION RATE: SED RATE: 3 mm/h (ref 0–40)

## 2014-12-29 LAB — C-REACTIVE PROTEIN: CRP: 1.2 mg/L (ref 0.0–4.9)

## 2015-01-01 ENCOUNTER — Telehealth: Payer: Self-pay

## 2015-01-01 NOTE — Telephone Encounter (Signed)
-----   Message from Kathrynn Ducking, MD sent at 12/29/2014  7:29 AM EST -----  The blood work results are unremarkable. Please call the patient.   ----- Message -----    From: [facility] Lab Results In Interface    Sent: 12/29/2014   5:41 AM      To: Kathrynn Ducking, MD

## 2015-01-01 NOTE — Telephone Encounter (Signed)
Called patient. Gave lab results. Patient verbalized understanding.  

## 2015-01-02 ENCOUNTER — Encounter: Payer: Self-pay | Admitting: Family Medicine

## 2015-01-02 DIAGNOSIS — E559 Vitamin D deficiency, unspecified: Secondary | ICD-10-CM | POA: Insufficient documentation

## 2015-01-02 DIAGNOSIS — R7303 Prediabetes: Secondary | ICD-10-CM | POA: Insufficient documentation

## 2015-01-02 MED ORDER — ERGOCALCIFEROL 1.25 MG (50000 UT) PO CAPS: 50000.0000 [IU] | ORAL_CAPSULE | ORAL | Status: DC

## 2015-01-02 NOTE — Addendum Note (Signed)
Addended by: Delman Cheadle on: 01/02/2015 12:45 PM   Modules accepted: Orders

## 2015-03-22 ENCOUNTER — Encounter: Payer: Self-pay | Admitting: Adult Health

## 2015-03-22 ENCOUNTER — Ambulatory Visit (INDEPENDENT_AMBULATORY_CARE_PROVIDER_SITE_OTHER): Payer: Medicare Other | Admitting: Adult Health

## 2015-03-22 VITALS — BP 135/88 | HR 69 | Ht 67.5 in | Wt 180.8 lb

## 2015-03-22 DIAGNOSIS — F0280 Dementia in other diseases classified elsewhere without behavioral disturbance: Secondary | ICD-10-CM | POA: Diagnosis not present

## 2015-03-22 DIAGNOSIS — G300 Alzheimer's disease with early onset: Secondary | ICD-10-CM

## 2015-03-22 MED ORDER — DONEPEZIL HCL 5 MG PO TABS: 5.0000 mg | ORAL_TABLET | Freq: Every day | ORAL | Status: DC

## 2015-03-22 NOTE — Patient Instructions (Signed)
Begin Aricept 5 mg at bedtime. If tolerating we can increase to 10 mg (2 tablets) in 1 month  We will continue to monitor memory If your symptoms worsen or you develop new symptoms please let us know.    Donepezil tablets What is this medicine? DONEPEZIL (doe NEP e zil) is used to treat mild to moderate dementia caused by Alzheimer's disease. This medicine may be used for other purposes; ask your health care provider or pharmacist if you have questions. What should I tell my health care provider before I take this medicine? They need to know if you have any of these conditions: -asthma or other lung disease -difficulty passing urine -head injury -heart disease -history of irregular heartbeat -liver disease -seizures (convulsions) -stomach or intestinal disease, ulcers or stomach bleeding -an unusual or allergic reaction to donepezil, other medicines, foods, dyes, or preservatives -pregnant or trying to get pregnant -breast-feeding How should I use this medicine? Take this medicine by mouth with a glass of water. Follow the directions on the prescription label. You may take this medicine with or without food. Take this medicine at regular intervals. This medicine is usually taken before bedtime. Do not take it more often than directed. Continue to take your medicine even if you feel better. Do not stop taking except on your doctor's advice. If you are taking the 23 mg donepezil tablet, swallow it whole; do not cut, crush, or chew it. Talk to your pediatrician regarding the use of this medicine in children. Special care may be needed. Overdosage: If you think you have taken too much of this medicine contact a poison control center or emergency room at once. NOTE: This medicine is only for you. Do not share this medicine with others. What if I miss a dose? If you miss a dose, take it as soon as you can. If it is almost time for your next dose, take only that dose, do not take double or  extra doses. What may interact with this medicine? Do not take this medicine with any of the following medications: -certain medicines for fungal infections like itraconazole, fluconazole, posaconazole, and voriconazole -cisapride -dextromethorphan; quinidine -dofetilide -dronedarone -pimozide -quinidine -thioridazine -ziprasidone This medicine may also interact with the following medications: -antihistamines for allergy, cough and cold -atropine -bethanechol -carbamazepine -certain medicines for bladder problems like oxybutynin, tolterodine -certain medicines for Parkinson's disease like benztropine, trihexyphenidyl -certain medicines for stomach problems like dicyclomine, hyoscyamine -certain medicines for travel sickness like scopolamine -dexamethasone -ipratropium -NSAIDs, medicines for pain and inflammation, like ibuprofen or naproxen -other medicines for Alzheimer's disease -other medicines that prolong the QT interval (cause an abnormal heart rhythm) -phenobarbital -phenytoin -rifampin, rifabutin or rifapentine This list may not describe all possible interactions. Give your health care provider a list of all the medicines, herbs, non-prescription drugs, or dietary supplements you use. Also tell them if you smoke, drink alcohol, or use illegal drugs. Some items may interact with your medicine. What should I watch for while using this medicine? Visit your doctor or health care professional for regular checks on your progress. Check with your doctor or health care professional if your symptoms do not get better or if they get worse. You may get drowsy or dizzy. Do not drive, use machinery, or do anything that needs mental alertness until you know how this drug affects you. What side effects may I notice from receiving this medicine? Side effects that you should report to your doctor or health care professional as soon as  possible: -allergic reactions like skin rash, itching or  hives, swelling of the face, lips, or tongue -changes in vision -feeling faint or lightheaded, falls -problems with balance -redness, blistering, peeling or loosening of the skin, including inside the mouth -slow heartbeat, or palpitations -stomach pain -unusual bleeding or bruising, red or purple spots on the skin -vomiting -weight loss Side effects that usually do not require medical attention (report to your doctor or health care professional if they continue or are bothersome): -diarrhea, especially when starting treatment -headache -indigestion or heartburn -loss of appetite -muscle cramps -nausea This list may not describe all possible side effects. Call your doctor for medical advice about side effects. You may report side effects to FDA at 1-800-FDA-1088. Where should I keep my medicine? Keep out of reach of children. Store at room temperature between 15 and 30 degrees C (59 and 86 degrees F). Throw away any unused medicine after the expiration date. NOTE: This sheet is a summary. It may not cover all possible information. If you have questions about this medicine, talk to your doctor, pharmacist, or health care provider.    2016, Elsevier/Gold Standard. (2013-08-11 07:51:52)

## 2015-03-22 NOTE — Progress Notes (Signed)
I have read the note, and I agree with the clinical assessment and plan.  Jaileigh Weimer KEITH   
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
## 2015-06-25 ENCOUNTER — Ambulatory Visit (INDEPENDENT_AMBULATORY_CARE_PROVIDER_SITE_OTHER): Payer: Medicare Other | Admitting: Adult Health

## 2015-06-25 ENCOUNTER — Encounter: Payer: Self-pay | Admitting: Adult Health

## 2015-06-25 VITALS — BP 112/69 | HR 70 | Ht 67.5 in | Wt 178.5 lb

## 2015-06-25 DIAGNOSIS — F0280 Dementia in other diseases classified elsewhere without behavioral disturbance: Secondary | ICD-10-CM

## 2015-06-25 DIAGNOSIS — G308 Other Alzheimer's disease: Secondary | ICD-10-CM

## 2015-06-25 MED ORDER — DONEPEZIL HCL 10 MG PO TABS: 10.0000 mg | ORAL_TABLET | Freq: Every day | ORAL | Status: DC

## 2015-06-25 NOTE — Patient Instructions (Signed)
Memory is stable Increase Aricept to 10 mg daily If your symptoms worsen or you develop new symptoms please let us know.
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
## 2015-12-19 ENCOUNTER — Telehealth: Payer: Self-pay | Admitting: Adult Health

## 2015-12-19 NOTE — Telephone Encounter (Signed)
I called the patient, talk with husband. The patient has had some episodes of wandering off, some agitation that is worse in the evenings. The patient will be seen next week.    the patient could be placed on Remeron in the evening hours to help her rest and reduce agitation. The husband will need to put dead bolts on the doors, consider getting a GPS locator  To place on the patient with a bracelet or a necklace.    we will discuss  Medical therapy next week.

## 2015-12-19 NOTE — Telephone Encounter (Signed)
The patient's husband called and states that she wondered off this morning. States that last night she left the house around 11 and he thought this was unusual so he went out and got her and brought her back in. He states this morning he got up to go the bathroom around 6 and she was in the bed. When they got up later she went out to get the paper which she normally does. However she did not come back. He states that recently she has been doing "strange things. He has issued a silver alert but was calling to make our office aware in case she shows up here.

## 2015-12-26 ENCOUNTER — Ambulatory Visit (INDEPENDENT_AMBULATORY_CARE_PROVIDER_SITE_OTHER): Payer: Medicare Other | Admitting: Neurology

## 2015-12-26 ENCOUNTER — Encounter: Payer: Self-pay | Admitting: Neurology

## 2015-12-26 VITALS — BP 157/95 | HR 62 | Ht 67.5 in | Wt 178.0 lb

## 2015-12-26 DIAGNOSIS — F0280 Dementia in other diseases classified elsewhere without behavioral disturbance: Secondary | ICD-10-CM | POA: Diagnosis not present

## 2015-12-26 DIAGNOSIS — G300 Alzheimer's disease with early onset: Secondary | ICD-10-CM

## 2015-12-26 NOTE — Progress Notes (Signed)
    Reason for visit: Alzheimer's disease  Tammy [name] is an 70 y.o. female  History of present illness:  Tammy [name] is a 70 year old right-handed black female with a history of a progressive memory disorder consistent with Alzheimer's disease. The patient last week had episodes where she would wander off, at one point she had gone for 2 hours and the husband did not know where she was. The patient at times has difficulty taking her pills at night, she will oftentimes refuse them. The patient is not having agitation or hallucinations. The patient is tolerating the Aricept, she is not having diarrhea or weight loss. The patient is brought in for an urgent evaluation.  Past Medical History:  Diagnosis Date  . Alzheimer's disease 12/28/2014  . Headache disorder 12/28/2014  . Memory disturbance 12/28/2014    History reviewed. No pertinent surgical history.  Family History  Problem Relation Age of Onset  . Heart disease Brother   . Heart attack Mother   . Dementia Mother     Social history:  reports that she has never smoked. She has never used smokeless tobacco. She reports that she does not drink alcohol or use drugs.    Allergies  Allergen Reactions  . Sulfa Antibiotics Palpitations    Medications:  Prior to Admission medications   Medication Sig Start Date End Date Taking? Authorizing Provider  donepezil (ARICEPT) 10 MG tablet Take 1 tablet (10 mg total) by mouth at bedtime. 06/25/15   Ward Givens, NP  naproxen (NAPROSYN) 500 MG tablet Take by mouth. As needed 12/24/12   Historical Provider, MD    ROS:  Out of a complete 14 system review of symptoms, the patient complains only of the following symptoms, and all other reviewed systems are negative.  Memory disturbance  Blood pressure (!) 157/95, pulse 62, height 5' 7.5" (1.715 m), weight 178 lb (80.7 kg).  Physical Exam  General: The patient is alert and cooperative at the time of the examination.  Skin: No  significant peripheral edema is noted.   Neurologic Exam  Mental status: The patient is alert and oriented x 1 at the time of the examination (not oriented to place or date). The Mini-Mental Status Examination done today shows a total score of 14/30.   Cranial nerves: Facial symmetry is present. Speech is normal, no aphasia or dysarthria is noted. Extraocular movements are full. Visual fields are full.  Motor: The patient has good strength in all 4 extremities.  Sensory examination: Soft touch sensation is symmetric on the face, arms, and legs.  Coordination: The patient has good finger-nose-finger and heel-to-shin bilaterally.  Gait and station: The patient has a normal gait. Tandem gait is normal. Romberg is negative. No drift is seen.  Reflexes: Deep tendon reflexes are symmetric.   Assessment/Plan:  1. Alzheimer's disease  The patient is having some wandering behavior. The husband is to get deadbolt locks on the doors, he may also have a GPS locator placed on the patient. The patient will continue the Aricept, there is no agitation at this time, and no indication for other medications. The husband will contact me if this issue changes. The patient will follow-up in about 6 months.  Jill Alexanders MD 12/26/2015 10:02 AM  [facility] [address] [place], [place] [address]  Phone [phone] Fax [phone]

## 2016-02-22 ENCOUNTER — Ambulatory Visit (INDEPENDENT_AMBULATORY_CARE_PROVIDER_SITE_OTHER): Payer: Medicare Other | Admitting: Family Medicine

## 2016-02-22 ENCOUNTER — Telehealth: Payer: Self-pay | Admitting: *Deleted

## 2016-02-22 VITALS — BP 120/72 | HR 100 | Temp 97.6°F | Ht 67.5 in | Wt 159.2 lb

## 2016-02-22 DIAGNOSIS — R634 Abnormal weight loss: Secondary | ICD-10-CM | POA: Diagnosis not present

## 2016-02-22 DIAGNOSIS — R630 Anorexia: Secondary | ICD-10-CM | POA: Diagnosis not present

## 2016-02-22 DIAGNOSIS — R627 Adult failure to thrive: Secondary | ICD-10-CM

## 2016-02-22 DIAGNOSIS — R4189 Other symptoms and signs involving cognitive functions and awareness: Secondary | ICD-10-CM

## 2016-02-22 DIAGNOSIS — G300 Alzheimer's disease with early onset: Secondary | ICD-10-CM

## 2016-02-22 DIAGNOSIS — F0280 Dementia in other diseases classified elsewhere without behavioral disturbance: Secondary | ICD-10-CM | POA: Diagnosis not present

## 2016-02-22 DIAGNOSIS — Z23 Encounter for immunization: Secondary | ICD-10-CM

## 2016-02-22 LAB — POCT CBC
Granulocyte percent: 68.3 %G (ref 37–80)
HCT, POC: 41.1 % (ref 37.7–47.9)
HEMOGLOBIN: 14.4 g/dL (ref 12.2–16.2)
LYMPH, POC: 1.8 (ref 0.6–3.4)
MCH, POC: 29.2 pg (ref 27–31.2)
MCHC: 35.1 g/dL (ref 31.8–35.4)
MCV: 83.3 fL (ref 80–97)
MID (cbc): 0.5 (ref 0–0.9)
MPV: 9.1 fL (ref 0–99.8)
POC Granulocyte: 4.8 (ref 2–6.9)
POC LYMPH %: 25 % (ref 10–50)
POC MID %: 6.7 % (ref 0–12)
Platelet Count, POC: 133 10*3/uL — AB (ref 142–424)
RBC: 4.94 M/uL (ref 4.04–5.48)
RDW, POC: 13.4 %
WBC: 7 10*3/uL (ref 4.6–10.2)

## 2016-02-22 LAB — POCT URINALYSIS DIP (MANUAL ENTRY)
Glucose, UA: NEGATIVE
NITRITE UA: NEGATIVE
PH UA: 5.5
Protein Ur, POC: 100 — AB
RBC UA: NEGATIVE
Spec Grav, UA: >=1.03
UROBILINOGEN UA: 1

## 2016-02-22 LAB — GLUCOSE, POCT (MANUAL RESULT ENTRY): POC GLUCOSE: 111 mg/dL — AB (ref 70–99)

## 2016-02-22 NOTE — Telephone Encounter (Signed)
Spoke to Richmond Campbell to get clarification on message. Husband thought patient had appointment today. Her symptoms have gotten worse. They do not have a follow-up until June. She had f/u 12/2015.

## 2016-02-22 NOTE — Progress Notes (Signed)
Chief Complaint  Patient presents with  . Blood Work    Pt's husband states-  pt's health is declining    HPI   Within the last couple of weeks she has been less argumentative and has stopped eating She will eat 2 or 3 spoonfuls Wt Readings from Last 3 Encounters:  02/22/16 159 lb 3.2 oz (72.2 kg)  12/26/15 178 lb (80.7 kg)  06/25/15 178 lb 8 oz (81 kg)   She has lost a lot of weight  She sleeps off and on but she is up at night  She does not take her aricept because she would wonder away from the house. She was diagnosed with Alzheimers a year ago She does not display behaviors concerning for harm to herself and others She drinks some of the ensure and only a little water After going to Neurology she stopped at the [employer] and only a little orange juice  Past Medical History:  Diagnosis Date  . Alzheimer's disease 12/28/2014  . Headache disorder 12/28/2014  . Memory disturbance 12/28/2014    Current Outpatient Prescriptions  Medication Sig Dispense Refill  . donepezil (ARICEPT) 10 MG tablet Take 1 tablet (10 mg total) by mouth at bedtime. (Patient not taking: Reported on 02/22/2016) 30 tablet 11  . naproxen (NAPROSYN) 500 MG tablet Take by mouth. As needed     No current facility-administered medications for this visit.     Allergies:  Allergies  Allergen Reactions  . Sulfa Antibiotics Palpitations    History reviewed. No pertinent surgical history.  Social History   Social History  . Marital status: Married    Spouse name: [name]  . Number of children: 3  . Years of education: 10   Occupational History  . retired    Social History Main Topics  . Smoking status: Never Smoker  . Smokeless tobacco: Never Used  . Alcohol use No  . Drug use: No  . Sexual activity: Not Asked   Other Topics Concern  . None   Social History Narrative   Married with 3 children   Right handed   8th grade   1 cup coffee daily    ROS  Objective: Vitals:   02/22/16 1120  BP: 120/72  Pulse: 100  Temp: 97.6 F (36.4 C)  TempSrc: Oral  SpO2: 97%  Weight: 159 lb 3.2 oz (72.2 kg)  Height: 5' 7.5" (1.715 m)    Physical Exam  Constitutional: She appears well-developed and well-nourished.  HENT:  Head: Normocephalic and atraumatic.  Right Ear: External ear normal.  Left Ear: External ear normal.  Nose: Nose normal.  Mouth/Throat: Oropharynx is clear and moist. No oropharyngeal exudate.  Eyes: Conjunctivae and EOM are normal.  Neck: Normal range of motion. No thyromegaly present.  Cardiovascular: Normal rate, regular rhythm, normal heart sounds and intact distal pulses.   No murmur heard. Pulmonary/Chest: Effort normal and breath sounds normal. No respiratory distress. She has no wheezes. She has no rales.  Abdominal: Soft. Bowel sounds are normal. She exhibits no distension. There is no tenderness.  Neurological: She displays normal reflexes. She exhibits normal muscle tone.  Pt knows her commonly known name of Clarise Cruz but does not know her given name. Does not know where she is. Is not aware of the date. Does not follow instructions.   Skin: Capillary refill takes less than 2 seconds. No rash noted. No erythema.    Assessment and Plan Masiyah was seen today for blood work.  Diagnoses and  all orders for this visit:  Need for prophylactic vaccination and inoculation against influenza -     Flu Vaccine QUAD 36+ mos IM  Cognitive decline- likely advancing dementia Discussed the need for follow up with Neurology but will check for underlying causes Currently stable and taking PO therefore will not send to the ER since there are no overt signs of dehydration -     POCT CBC -     POCT glucose (manual entry) -     POCT urinalysis dipstick -     Comprehensive metabolic panel -     Vitamin B12 -     Urine culture -     TSH -     EKG 12-Lead  Loss of weight- will also check other causes like thyroid and nutrition status -     POCT CBC -      POCT glucose (manual entry) -     POCT urinalysis dipstick -     Comprehensive metabolic panel -     Vitamin B12 -     Urine culture -     TSH  Anorexia- advised pt to send in calorie rich foods to imporve her interest in eating Pleasure feeds (give her what she likes) and to add ensure and things like condense -     POCT CBC -     POCT glucose (manual entry) -     POCT urinalysis dipstick -     Comprehensive metabolic panel -     Vitamin B12 -     Urine culture -     TSH  Early onset Alzheimer's dementia without behavioral disturbance- discussed that this functional decline could be due to underlying derangement or advancement of her disease Checked labs and at this point she is off aricep  Failure to thrive in adult- EKG without ischemic changes -     EKG 12-Lead   Pt evaluated today for an acute causes of rapid decline as well as a repeat check of her thyroid, b12 and her liver enzymes as well as electrolytes Discussed with her husband to give her calorie rich foods   A total of 45 minutes were spent face-to-face with the patient during this encounter and over half of that time was spent on counseling and coordination of care.  [employer]

## 2016-02-22 NOTE — Telephone Encounter (Signed)
Called husband back. He stated she quit responding, quit eating. If she goes out to eat, she will eat two or three spoonfuls. She used to be a "hell raiser" per husband. She is withdrawn now. This started about a week or so ago.   He stated they went to [facility] for sister birthday. When they got there, she started complaining that Friday night with chest pain, side hurting. They went home because of this. By the time they got back to [facility], she was feeling better.  He contacted PCP and made f/u with them on. That appt made for Monday, 02/25/16 at 11:15am. She has not complained of these symptoms since then.   However, she is mumbling her words. He feels she is more confused.  It takes her awhile to answer questions. She drinks water each day but not very much. Asked husband about symptoms of UTI, but he is not sure if she is displaying these symptoms. Advised I will speak to [facility] since Dr Jannifer Franklin out of office until Monday. They may need to bring her to urgent care/ED prior to PCP appt on Monday. Advised I will call back to advise. He verbalized understanding.

## 2016-02-22 NOTE — Telephone Encounter (Signed)
Called husband back. Advised that they should bring her to urgent care/ED to be evaluated asap for UTI, have labs done, etc asap given recent changes. He tried calling PCP to see if they could fit her in today. He was unable to get through. He will try one more time, if not he will bring her to urgent care to be evaluated.

## 2016-02-22 NOTE — Telephone Encounter (Signed)
Agree with plan. --VRP 

## 2016-02-22 NOTE — Patient Instructions (Addendum)
IF you received an x-ray today, you will receive an invoice from [facility]. Please contact [facility] at [phone] with questions or concerns regarding your invoice.   IF you received labwork today, you will receive an invoice from [facility]. Please contact [facility] at [phone] with questions or concerns regarding your invoice.   Our billing staff will not be able to assist you with questions regarding bills from these companies.  You will be contacted with the lab results as soon as they are available. The fastest way to get your results is to activate your My Chart account. Instructions are located on the last page of this paperwork. If you have not heard from [place] regarding the results in 2 weeks, please contact this office.     Failure to Thrive, Adult Introduction Failure to thrive is a group of symptoms that affect elderly adults. These symptoms include loss of appetite and weight loss. People who have this condition may do fewer and fewer activities over time. They may lose interest in being with friends or they may not want to eat or drink. This condition is not a normal part of aging. What are the causes? This condition may be caused by:  A disease, such dementia, diabetes, cancer, or lung disease.  A health problem, such as a vitamin deficiency or a heart problem.  A disorder, such as depression.  A disability.  Medicines.  Mistreatment or neglect. In some cases, the cause may not be known. What are the signs or symptoms? Symptoms of this condition include:  Loss of more than 5% of your body weight.  Being more tired than normal after an activity.  Having trouble getting up after sitting.  Loss of appetite.  Not getting out of bed.  Not wanting to do usual activities.  Depression.  Getting infections often.  Bedsores.  Taking a long time to recover after an injury or a surgery.  Weakness. How is this diagnosed? This  condition may be diagnosed with a physical exam. Your health care provider will ask questions about your health, behavior, and mood, such as:  Has your activity changed?  Do you seem sad?  Are your eating habits different? Tests may also be done. They may include:  Blood tests.  Urine tests.  Imaging tests, such as X-rays, a CT scan, or MRI.  Hearing tests.  Vision tests.  Tests to check thinking ability (cognitive tests).  Activity tests to see if you can do tasks such as bathing and dressing and to see if you can move around safely. You may be referred to a specialist. How is this treated? Treatment for this condition depends on the cause. It may involve:  Treating the cause.  Talk therapy or medicine to treat depression.  Improving diet, such as by eating more often or taking nutritional supplements.  Changing or stopping a medicine.  Physical therapy. It often takes a team of health care providers to find the right treatment. Follow these instructions at home:  Take over-the-counter and prescription medicines only as told by your health care provider.  Eat a healthy, well-balanced diet. Make sure to get enough calories in each meal.  Be physically active. Include strength training as part of your exercise routine. A physical therapist can help to set up an exercise program that fits you.  Make sure that you are safe at home.  Make sure that you have a plan for what to do if you become unable to make  decisions for yourself. Contact a health care provider if:  You are not able to eat well.  You are not able to move around.  You feel very sad or hopeless. Get help right away if:  You have thoughts of ending your life.  You cannot eat or drink.  You do not get out of bed.  Staying at home is no longer safe.  You have a fever. This information is not intended to replace advice given to you by your health care provider. Make sure you discuss any  questions you have with your health care provider. Document Released: 03/24/2011 Document Revised: 06/07/2015 Document Reviewed: 03/27/2014  2017 Elsevier

## 2016-02-23 ENCOUNTER — Encounter (HOSPITAL_COMMUNITY): Payer: Self-pay | Admitting: Emergency Medicine

## 2016-02-23 ENCOUNTER — Emergency Department (HOSPITAL_COMMUNITY): Payer: Medicare Other

## 2016-02-23 ENCOUNTER — Inpatient Hospital Stay (HOSPITAL_COMMUNITY)
Admission: EM | Admit: 2016-02-23 | Discharge: 2016-02-25 | DRG: 176 | Disposition: A | Discharge status: Home / Self Care | Payer: Medicare Other | Attending: Family Medicine | Admitting: Family Medicine

## 2016-02-23 DIAGNOSIS — R079 Chest pain, unspecified: Secondary | ICD-10-CM | POA: Diagnosis not present

## 2016-02-23 DIAGNOSIS — I2699 Other pulmonary embolism without acute cor pulmonale: Secondary | ICD-10-CM

## 2016-02-23 DIAGNOSIS — N390 Urinary tract infection, site not specified: Secondary | ICD-10-CM | POA: Diagnosis not present

## 2016-02-23 DIAGNOSIS — F0280 Dementia in other diseases classified elsewhere without behavioral disturbance: Secondary | ICD-10-CM | POA: Diagnosis present

## 2016-02-23 DIAGNOSIS — G300 Alzheimer's disease with early onset: Secondary | ICD-10-CM | POA: Diagnosis not present

## 2016-02-23 DIAGNOSIS — N838 Other noninflammatory disorders of ovary, fallopian tube and broad ligament: Secondary | ICD-10-CM | POA: Diagnosis present

## 2016-02-23 DIAGNOSIS — G309 Alzheimer's disease, unspecified: Secondary | ICD-10-CM | POA: Diagnosis present

## 2016-02-23 DIAGNOSIS — N3000 Acute cystitis without hematuria: Secondary | ICD-10-CM | POA: Diagnosis present

## 2016-02-23 DIAGNOSIS — Z79899 Other long term (current) drug therapy: Secondary | ICD-10-CM

## 2016-02-23 DIAGNOSIS — I82411 Acute embolism and thrombosis of right femoral vein: Secondary | ICD-10-CM | POA: Diagnosis present

## 2016-02-23 DIAGNOSIS — R109 Unspecified abdominal pain: Secondary | ICD-10-CM

## 2016-02-23 DIAGNOSIS — N839 Noninflammatory disorder of ovary, fallopian tube and broad ligament, unspecified: Secondary | ICD-10-CM | POA: Diagnosis present

## 2016-02-23 DIAGNOSIS — R634 Abnormal weight loss: Secondary | ICD-10-CM | POA: Diagnosis present

## 2016-02-23 DIAGNOSIS — I82431 Acute embolism and thrombosis of right popliteal vein: Secondary | ICD-10-CM | POA: Diagnosis present

## 2016-02-23 DIAGNOSIS — E876 Hypokalemia: Secondary | ICD-10-CM | POA: Diagnosis present

## 2016-02-23 DIAGNOSIS — I2782 Chronic pulmonary embolism: Secondary | ICD-10-CM | POA: Diagnosis not present

## 2016-02-23 HISTORY — DX: Other pulmonary embolism without acute cor pulmonale: I26.99

## 2016-02-23 LAB — CBC
HCT: 41 % (ref 36.0–46.0)
HEMOGLOBIN: 13.9 g/dL (ref 12.0–15.0)
MCH: 28.7 pg (ref 26.0–34.0)
MCHC: 33.9 g/dL (ref 30.0–36.0)
MCV: 84.7 fL (ref 78.0–100.0)
PLATELETS: 130 10*3/uL — AB (ref 150–400)
RBC: 4.84 MIL/uL (ref 3.87–5.11)
RDW: 13.6 % (ref 11.5–15.5)
WBC: 7.3 10*3/uL (ref 4.0–10.5)

## 2016-02-23 LAB — COMPREHENSIVE METABOLIC PANEL
ALK PHOS: 56 U/L (ref 38–126)
ALT: 6 IU/L (ref 0–32)
ALT: 11 U/L — AB (ref 14–54)
AST: 14 IU/L (ref 0–40)
AST: 18 U/L (ref 15–41)
Albumin/Globulin Ratio: 1.3 (ref 1.2–2.2)
Albumin: 4.4 g/dL (ref 3.6–4.8)
Albumin: 4.2 g/dL (ref 3.5–5.0)
Alkaline Phosphatase: 63 IU/L (ref 39–117)
Anion gap: 11 (ref 5–15)
BUN/Creatinine Ratio: 25 (ref 12–28)
BUN: 20 mg/dL (ref 8–27)
BUN: 20 mg/dL (ref 6–20)
Bilirubin Total: 0.5 mg/dL (ref 0.0–1.2)
CALCIUM: 9.9 mg/dL (ref 8.7–10.3)
CALCIUM: 9.5 mg/dL (ref 8.9–10.3)
CHLORIDE: 98 mmol/L (ref 96–106)
CO2: 25 mmol/L (ref 18–29)
CO2: 28 mmol/L (ref 22–32)
CREATININE: 0.86 mg/dL (ref 0.44–1.00)
Chloride: 103 mmol/L (ref 101–111)
Creatinine, Ser: 0.79 mg/dL (ref 0.57–1.00)
GFR, EST AFRICAN AMERICAN: 89 mL/min/{1.73_m2} (ref >59)
GFR, EST NON AFRICAN AMERICAN: 77 mL/min/{1.73_m2} (ref >59)
GLUCOSE: 107 mg/dL — AB (ref 65–99)
Globulin, Total: 3.5 g/dL (ref 1.5–4.5)
Glucose, Bld: 131 mg/dL — ABNORMAL HIGH (ref 65–99)
POTASSIUM: 3.4 mmol/L — AB (ref 3.5–5.2)
Potassium: 2.9 mmol/L — ABNORMAL LOW (ref 3.5–5.1)
SODIUM: 142 mmol/L (ref 135–145)
Sodium: 143 mmol/L (ref 134–144)
TOTAL PROTEIN: 7.9 g/dL (ref 6.0–8.5)
Total Bilirubin: 0.6 mg/dL (ref 0.3–1.2)
Total Protein: 8.1 g/dL (ref 6.5–8.1)

## 2016-02-23 LAB — URINALYSIS, ROUTINE W REFLEX MICROSCOPIC
Bilirubin Urine: NEGATIVE
GLUCOSE, UA: NEGATIVE mg/dL
Ketones, ur: 20 mg/dL — AB
NITRITE: NEGATIVE
PH: 5 (ref 5.0–8.0)
Protein, ur: 30 mg/dL — AB
SPECIFIC GRAVITY, URINE: 1.03 (ref 1.005–1.030)

## 2016-02-23 LAB — I-STAT CG4 LACTIC ACID, ED: Lactic Acid, Venous: 1.65 mmol/L (ref 0.5–1.9)

## 2016-02-23 LAB — URINE CULTURE

## 2016-02-23 LAB — I-STAT TROPONIN, ED
TROPONIN I, POC: 0 ng/mL (ref 0.00–0.08)
TROPONIN I, POC: 0 ng/mL (ref 0.00–0.08)
TROPONIN I, POC: 0.01 ng/mL (ref 0.00–0.08)

## 2016-02-23 LAB — HEPARIN LEVEL (UNFRACTIONATED): Heparin Unfractionated: 0.99 IU/mL — ABNORMAL HIGH (ref 0.30–0.70)

## 2016-02-23 LAB — D-DIMER, QUANTITATIVE: D-Dimer, Quant: >20 ug/mL-FEU — ABNORMAL HIGH (ref 0.00–0.50)

## 2016-02-23 LAB — LIPASE, BLOOD: Lipase: 41 U/L (ref 11–51)

## 2016-02-23 LAB — VITAMIN B12: Vitamin B-12: 626 pg/mL (ref 232–1245)

## 2016-02-23 LAB — TSH: TSH: 1.18 u[IU]/mL (ref 0.450–4.500)

## 2016-02-23 MED ORDER — ACETAMINOPHEN 650 MG RE SUPP: 650.0000 mg | Freq: Four times a day (QID) | RECTAL | Status: DC | PRN

## 2016-02-23 MED ORDER — HEPARIN BOLUS VIA INFUSION
5000.0000 [IU] | Freq: Once | INTRAVENOUS | Status: AC
Start: 2016-02-23 — End: 2016-02-23
  Administered 2016-02-23: 5000 [IU] via INTRAVENOUS
  Filled 2016-02-23: qty 5000

## 2016-02-23 MED ORDER — SODIUM CHLORIDE 0.9 % IV BOLUS (SEPSIS)
1000.0000 mL | Freq: Once | INTRAVENOUS | Status: AC
  Administered 2016-02-23: 1000 mL via INTRAVENOUS

## 2016-02-23 MED ORDER — CEPHALEXIN 500 MG PO CAPS
500.0000 mg | ORAL_CAPSULE | Freq: Once | ORAL | Status: AC
  Administered 2016-02-23: 500 mg via ORAL
  Filled 2016-02-23: qty 1

## 2016-02-23 MED ORDER — IOPAMIDOL (ISOVUE-370) INJECTION 76%
100.0000 mL | Freq: Once | INTRAVENOUS | Status: AC | PRN
  Administered 2016-02-23: 80 mL via INTRAVENOUS

## 2016-02-23 MED ORDER — IOPAMIDOL (ISOVUE-370) INJECTION 76%
INTRAVENOUS | Status: AC
  Filled 2016-02-23: qty 100

## 2016-02-23 MED ORDER — IOPAMIDOL (ISOVUE-300) INJECTION 61%
INTRAVENOUS | Status: AC
  Filled 2016-02-23: qty 100

## 2016-02-23 MED ORDER — HEPARIN (PORCINE) IN NACL 100-0.45 UNIT/ML-% IJ SOLN
1050.0000 [IU]/h | INTRAMUSCULAR | Status: DC
  Administered 2016-02-23: 1200 [IU]/h via INTRAVENOUS
  Administered 2016-02-24: 1050 [IU]/h via INTRAVENOUS
  Filled 2016-02-23 (×2): qty 250

## 2016-02-23 MED ORDER — POTASSIUM CHLORIDE CRYS ER 20 MEQ PO TBCR
40.0000 meq | EXTENDED_RELEASE_TABLET | Freq: Once | ORAL | Status: AC
  Administered 2016-02-23: 40 meq via ORAL
  Filled 2016-02-23: qty 2

## 2016-02-23 MED ORDER — IOPAMIDOL (ISOVUE-300) INJECTION 61%
100.0000 mL | Freq: Once | INTRAVENOUS | Status: AC | PRN
  Administered 2016-02-23: 100 mL via INTRAVENOUS

## 2016-02-23 MED ORDER — SODIUM CHLORIDE 0.9 % IV SOLN
Freq: Once | INTRAVENOUS | Status: AC
  Administered 2016-02-23: 20:00:00 via INTRAVENOUS
  Filled 2016-02-23: qty 1000

## 2016-02-23 MED ORDER — ACETAMINOPHEN 325 MG PO TABS: 650.0000 mg | ORAL_TABLET | Freq: Four times a day (QID) | ORAL | Status: DC | PRN

## 2016-02-23 MED ORDER — CEPHALEXIN 500 MG PO CAPS
500.0000 mg | ORAL_CAPSULE | Freq: Two times a day (BID) | ORAL | Status: DC
  Administered 2016-02-23: 500 mg via ORAL
  Filled 2016-02-23: qty 1

## 2016-02-23 NOTE — ED Notes (Signed)
Pt being assisted with Danielle NT to restroom via wheelchair and stand by assist. Pt verbalizes dizziness with standing. Provider aware.

## 2016-02-23 NOTE — ED Triage Notes (Signed)
Pt BIB husband for possible abdominal pain; pt has Alzheimer's and has been rubbing and pointing to her abdomen; when asked how she feels, pt holds abdomen and states "I feel funny"; husband denies N/V/D; husband states pt had abdominal pain 1 week ago that resolved; pt seen at Urgent Care yesterday

## 2016-02-23 NOTE — ED Notes (Signed)
Thornton at bedside attempting US guided IV.

## 2016-02-23 NOTE — ED Provider Notes (Signed)
[facility] DEPT Provider Note   CSN: [id number] Arrival date & time: 02/23/16  [id number]     History   Chief Complaint Chief Complaint  Patient presents with  . Abdominal Pain    HPI Tammy [name] is a 71 y.o. female with a past medical history significant for Alzheimer's dementia who presents with abdominal pain, decreased oral intake, and fatigue. Patient is accompanied by husband who reports that for the last month, the patient has been more forgetful and has had decrease in mental status. He says this is a gradual change. He says that she has also lost approximately 18 pounds since December. He brings her in today because over the last 2 days, she has had intermittent severe abdominal pain. She is unable to describe the discomfort but he says she grabs her right upper quadrant and is in significant pain intermittently. This happened at 4 AM this morning prompting him to bring her in for evaluation. He took her to urgent care yesterday where she reportedly had a workup that was negative.   Patient denies fevers, chills, conservation, diarrhea, change in urination, cough, congestion, rhinorrhea, or chest pain. She has not had any nausea or vomiting.     HPI  Past Medical History:  Diagnosis Date  . Alzheimer's disease 12/28/2014  . Headache disorder 12/28/2014  . Memory disturbance 12/28/2014    Patient Active Problem List   Diagnosis Date Noted  . Vitamin D deficiency 01/02/2015  . Prediabetes 01/02/2015  . Memory disturbance 12/28/2014  . Headache disorder 12/28/2014  . Alzheimer's disease 12/28/2014    History reviewed. No pertinent surgical history.  OB History    No data available       Home Medications    Prior to Admission medications   Medication Sig Start Date End Date Taking? Authorizing Provider  donepezil (ARICEPT) 10 MG tablet Take 1 tablet (10 mg total) by mouth at bedtime. Patient not taking: Reported on 02/22/2016 06/25/15   Ward Givens, NP    naproxen (NAPROSYN) 500 MG tablet Take by mouth. As needed 12/24/12   Historical Provider, MD    Family History Family History  Problem Relation Age of Onset  . Heart disease Brother   . Heart attack Mother   . Dementia Mother     Social History Social History  Substance Use Topics  . Smoking status: Never Smoker  . Smokeless tobacco: Never Used  . Alcohol use No     Allergies   Sulfa antibiotics   Review of Systems Review of Systems  Constitutional: Positive for appetite change, fatigue and unexpected weight change. Negative for chills, diaphoresis and fever.  HENT: Negative for congestion and rhinorrhea.   Respiratory: Negative for cough, chest tightness, shortness of breath, wheezing and stridor.   Cardiovascular: Negative for chest pain, palpitations and leg swelling.  Gastrointestinal: Positive for abdominal pain. Negative for constipation, diarrhea, nausea and vomiting.  Genitourinary: Negative for dysuria and flank pain.  Musculoskeletal: Negative for neck pain and neck stiffness.  Neurological: Negative for syncope and headaches.  Psychiatric/Behavioral: Negative for agitation and confusion.  All other systems reviewed and are negative.    Physical Exam Updated Vital Signs BP 118/91   Pulse 102   Temp 98.3 F (36.8 C) (Oral)   Resp 18   SpO2 96%   Physical Exam  Constitutional: She appears well-developed and well-nourished. No distress.  HENT:  Head: Normocephalic and atraumatic.  Mouth/Throat: Oropharynx is clear and moist. No oropharyngeal exudate.  Eyes: Conjunctivae and  EOM are normal. Pupils are equal, round, and reactive to light.  Neck: Normal range of motion. Neck supple.  Cardiovascular: Normal rate, regular rhythm and intact distal pulses.   No murmur heard. Pulmonary/Chest: Effort normal and breath sounds normal. No stridor. No respiratory distress. She has no wheezes. She exhibits no tenderness.  Abdominal: Soft. There is no tenderness.   Musculoskeletal: She exhibits no edema.  Neurological: She is alert. No cranial nerve deficit or sensory deficit. She exhibits normal muscle tone.  Skin: Skin is warm and dry. Capillary refill takes less than 2 seconds.  Psychiatric: She has a normal mood and affect.  Nursing note and vitals reviewed.    ED Treatments / Results  Labs (all labs ordered are listed, but only abnormal results are displayed) Labs Reviewed  COMPREHENSIVE METABOLIC PANEL - Abnormal; Notable for the following:       Result Value   Potassium 2.9 (*)    Glucose, Bld 131 (*)    ALT 11 (*)    All other components within normal limits  CBC - Abnormal; Notable for the following:    Platelets 130 (*)    All other components within normal limits  URINALYSIS, ROUTINE W REFLEX MICROSCOPIC - Abnormal; Notable for the following:    Color, Urine AMBER (*)    APPearance CLOUDY (*)    Hgb urine dipstick SMALL (*)    Ketones, ur 20 (*)    Protein, ur 30 (*)    Leukocytes, UA LARGE (*)    Bacteria, UA RARE (*)    Squamous Epithelial / LPF 6-30 (*)    All other components within normal limits  D-DIMER, QUANTITATIVE (NOT AT [facility]) - Abnormal; Notable for the following:    D-Dimer, Quant >20.00 (*)    All other components within normal limits  LIPASE, BLOOD  HEPARIN LEVEL (UNFRACTIONATED)  I-STAT TROPOININ, ED  I-STAT CG4 LACTIC ACID, ED  I-STAT TROPOININ, ED  I-STAT TROPOININ, ED  Randolm Idol, ED    EKG  EKG Interpretation  Date/Time:  Saturday February 23 2016 09:05:48 EST Ventricular Rate:  83 PR Interval:    QRS Duration: 92 QT Interval:  381 QTC Calculation: 448 R Axis:   57 Text Interpretation:  Sinus rhythm Low voltage, precordial leads No prior ECG for comparison No STEMI Confirmed by Sherry Ruffing MD, CHRISTOPHER [phone]) on 02/23/2016 10:55:15 AM       Radiology Dg Chest 2 View  Result Date: 02/23/2016 CLINICAL DATA:  Right upper quadrant abdominal pain EXAM: CHEST  2 VIEW COMPARISON:  None.  FINDINGS: Heart and mediastinal contours are within normal limits. Nodular density projects laterally over the right mid lung. Lungs otherwise clear. No effusions or acute bony abnormality. IMPRESSION: Questionable right peripheral mid lung nodule. This could be further evaluated with noncontrast chest CT. Electronically Signed   By: Rolm Baptise M.D.   On: 02/23/2016 08:30   Ct Angio Chest Pe W And/or Wo Contrast  Result Date: 02/23/2016 CLINICAL DATA:  Positive D-dimer.  Pain under ribs. EXAM: CT ANGIOGRAPHY CHEST WITH CONTRAST TECHNIQUE: Multidetector CT imaging of the chest was performed using the standard protocol during bolus administration of intravenous contrast. Multiplanar CT image reconstructions and MIPs were obtained to evaluate the vascular anatomy. CONTRAST:  80 mL of Isovue 370 COMPARISON:  Chest x-ray from earlier today FINDINGS: Cardiovascular: Multiple bilateral pulmonary emboli are identified involving segmental branches in all lobes. The maximum cross-sectional dimension of the left ventricle is 4.3 cm. This compares to 3.7 cm  in the right ventricle. No evidence of heart strain identified. The heart is normal in size. The thoracic aorta is normal in caliber with no dissection. Mediastinum/Nodes: No enlarged mediastinal, hilar, or axillary lymph nodes. Thyroid gland, trachea, and esophagus demonstrate no significant findings. Lungs/Pleura: The central airways are within normal limits. No pneumothorax. The possible nodule in the right lung on the recent chest x-ray correlates with a sclerotic lesion in a right posterior rib on series 8, image 52. No suspicious pulmonary nodules, masses, or infiltrates. Upper Abdomen: Multiple liver cysts. No other abnormalities in the upper abdomen. Musculoskeletal: No acute bony abnormalities. Review of the MIP images confirms the above findings. IMPRESSION: 1. Multiple segmental branch pulmonary emboli involving all lobes. No evidence of heart strain. 2. The  possible lung nodule seen on the recent chest x-ray correlates with a sclerotic lesion in a right posterior rib, indeterminate but possibly a bone island. Findings discussed with Dr. Sherry Ruffing Electronically Signed   By: Dorise Bullion III M.D   On: 02/23/2016 13:48   Ct Abdomen Pelvis W Contrast  Result Date: 02/23/2016 CLINICAL DATA:  Patient with alzheimer's disease. Husband reports possible abdominal pain today. EXAM: CT ABDOMEN AND PELVIS WITH CONTRAST TECHNIQUE: Multidetector CT imaging of the abdomen and pelvis was performed using the standard protocol following bolus administration of intravenous contrast. CONTRAST:  168mL ISOVUE-300 IOPAMIDOL (ISOVUE-300) INJECTION 61% COMPARISON:  None. FINDINGS: Lower chest: No acute abnormality. Hepatobiliary: Multiple hypodense, fluid attenuating right hepatic masses with the largest measuring 3.1 x 2.8 cm consistent with cysts. No other focal liver abnormality is seen. No gallstones, gallbladder wall thickening, or biliary dilatation. Pancreas: Unremarkable. No pancreatic ductal dilatation or surrounding inflammatory changes. Spleen: Normal in size without focal abnormality. Adrenals/Urinary Tract: Adrenal glands are unremarkable. Kidneys are normal, without renal calculi, focal lesion, or hydronephrosis. Bladder is unremarkable. Stomach/Bowel: Stomach is within normal limits. Appendix appears normal. No evidence of bowel wall thickening, distention, or inflammatory changes. Diverticulosis without evidence of diverticulitis. Vascular/Lymphatic: No significant vascular findings are present. No enlarged abdominal or pelvic lymph nodes. Reproductive: Normal uterus. Bilobed fat attenuating right lower pelvic mass measuring approximately 4.8 x 4.8 cm with the smaller fat nodule demonstrating peripheral calcification. Larger fatty mass has a 18 mm soft tissue mural nodule or ovarian tissue. This may reflect a teratoma or dermoid. Other: No abdominal wall hernia or  abnormality. No abdominopelvic ascites. Musculoskeletal: No acute osseous abnormality. No lytic or sclerotic osseous lesion. IMPRESSION: 1. No acute abdominal or pelvic pathology. 2. Fat attenuating right lower pelvic mass likely arising from the ovary. 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 Electronically Signed   By: Kathreen Devoid   On: 02/23/2016 08:45    Procedures Procedures (including critical care time)  CRITICAL CARE Performed by: Gwenyth Allegra Tegeler Total critical care time: 35 minutes Critical care time was exclusive of separately billable procedures and treating other patients. Critical care was necessary to treat or prevent imminent or life-threatening deterioration. Critical care was time spent personally by me on the following activities: development of treatment plan with patient and/or surrogate as well as nursing, discussions with consultants, evaluation of patient's response to treatment, examination of patient, obtaining history from patient or surrogate, ordering and performing treatments and interventions, ordering and review of laboratory studies, ordering and review of radiographic studies, pulse oximetry and re-evaluation of patient's condition.   Medications Ordered in ED Medications  iopamidol (ISOVUE-300) 61 % injection (not administered)  iopamidol (ISOVUE-370) 76 % injection (not administered)  heparin ADULT infusion 100 units/mL (25000 units/240mL sodium chloride 0.45%) (1,200 Units/hr Intravenous New Bag/Given 02/23/16 1509)  sodium chloride 0.9 % bolus 1,000 mL (0 mLs Intravenous Stopped 02/23/16 1110)  potassium chloride SA (K-DUR,KLOR-CON) CR tablet 40 mEq (40 mEq Oral Given 02/23/16 0853)  iopamidol (ISOVUE-300) 61 % injection 100 mL (100 mLs Intravenous Contrast Given 02/23/16 0827)  sodium chloride 0.9 % bolus 1,000 mL (0 mLs Intravenous Stopped 02/23/16 1211)  iopamidol (ISOVUE-370) 76 % injection 100 mL (80 mLs Intravenous Contrast Given 02/23/16  1326)  cephALEXin (KEFLEX) capsule 500 mg (500 mg Oral Given 02/23/16 1510)  heparin bolus via infusion 5,000 Units (5,000 Units Intravenous Bolus from Bag 02/23/16 1509)     Initial Impression / Assessment and Plan / ED Course  I have reviewed the triage vital signs and the nursing notes.  Pertinent labs & imaging results that were available during my care of the patient were reviewed by me and considered in my medical decision making (see chart for details).     Tammy [name] is a 71 y.o. female with a past medical history significant for Alzheimer's dementia who presents with abdominal pain, decreased oral intake, and fatigue.  History and exam are seen above.  On exam, patient's abdomen is nontender to palpation. Patient's lungs are clear. Patient's back is nontender and no CVA tenderness. Patient is oriented to person but not to place or time. No focal neurologic deficits were seen.  Based on the report of intermittent severe abdominal pain, patient will have workup to look for occult infection. With the decrease in intake, patient we given fluids during workup. Anticipate reassessment following testing.   Patient's poor testing showed a hypokalemia of 2.9. Oral potassium supplementation was ordered. Lactic acid was initially negative at 1.65. Troponin negative. Lipase negative. Analysis showed evidence of UTI with leukocytes and bacteria in the setting of abdominal discomfort. Patient treated with Keflex. No evidence of leukocytosis or anemia.  CT scan of the abdomen and pelvis shows concern for a small pelvic mass likely from the ovary. Radiology was called and they recommended GYN follow-up and no acute abnormality present. Patient was reassessed and she continued to have some tachycardia. Husband reports that he thinks the pain may be in the right lower chest under the ribs. With the tachycardia and possible chest discomfort, patient will have a d-dimer.  D-dimer returned at greater  than 20.   CT PE study ordered showing evidence of segmental pulmonary emboli in all segments of the lungs. She will be started on heparin and admitted to the hospitalist stepdown service for further management.   Final Clinical Impressions(s) / ED Diagnoses   Final diagnoses:  Abdominal pain, unspecified abdominal location  Other acute pulmonary embolism without acute cor pulmonale (HCC)  Acute cystitis without hematuria     Clinical Impression: 1. Abdominal pain, unspecified abdominal location   2. Other acute pulmonary embolism without acute cor pulmonale ([place])   3. Acute cystitis without hematuria     Disposition: Admit to hospitalist stepdown service    Courtney Paris, MD 02/24/16 0104

## 2016-02-23 NOTE — H&P (Signed)
History and Physical    Tammy [name] [id number] DOB: 08/14/1946  DOA: 02/23/2016 PCP: Delman Cheadle, MD  Patient coming from: Home   Chief Complaint: Lower r chest pain   HPI: Tammy [name] is a 71 y.o. female with medical history significant of Alzheimer's disease, sent to the ED complaining of lower right chest pain below the right breast for the past 2 days. Patient was seen 3 days ago in urgent care for the same labs were normal and was sent home with pain medication. This morning she started to have an intense persistent pain that did not resolve with ibuprofen. She denies shortness of breast, palpitations, diaphoresis and dizziness. Per husband who provides the majority of the history patient hasn't had any recent travel, no recent hospitalizations or surgery, not really sedentary, active at home. Nonsmoker  ED Course: D-dimer of 20, CTA chest found to have multiple segmental branch pulmonary emboli involving all lobes, saturations has remained stable. Started on heparin drip.  Review of Systems:   General: no changes in body weight, no fever chills or decrease in energy.  HEENT: no blurry vision, hearing changes or sore throat Respiratory: no dyspnea, coughing, wheezing CV: See history of present illness  GI: no nausea, vomiting, abdominal pain, diarrhea, constipation GU: no dysuria, burning on urination, increased urinary frequency, hematuria  Ext:. No deformities,  Neuro: no unilateral weakness, numbness, or tingling, no vision change or hearing loss Skin: No rashes, lesions or wounds. MSK: No muscle spasm, no deformity, no limitation of range of movement in spin Heme: No easy bruising.  Travel history: No recent long distant travel.   Past Medical History:  Diagnosis Date  . Alzheimer's disease 12/28/2014  . Headache disorder 12/28/2014  . Memory disturbance 12/28/2014    History reviewed. No pertinent surgical history.   reports that she has never smoked. She has  never used smokeless tobacco. She reports that she does not drink alcohol or use drugs.  Allergies  Allergen Reactions  . Sulfa Antibiotics Palpitations    Family History  Problem Relation Age of Onset  . Heart disease Brother   . Heart attack Mother   . Dementia Mother    Family history reviewed and non-pertinent  Prior to Admission medications   Medication Sig Start Date End Date Taking? Authorizing Provider  donepezil (ARICEPT) 10 MG tablet Take 1 tablet (10 mg total) by mouth at bedtime. Patient not taking: Reported on 02/22/2016 06/25/15   Ward Givens, NP  naproxen (NAPROSYN) 500 MG tablet Take by mouth. As needed 12/24/12   Historical Provider, MD    Physical Exam: Vitals:   02/23/16 1157 02/23/16 1353 02/23/16 1408 02/23/16 1503  BP: 111/85 157/71 (!) 129/101 122/85  Pulse: 95 95 88 77  Resp: 18 16 16 15   Temp:      TempSrc:      SpO2: 100% 100% 98% 100%     Constitutional: NAD, calm, comfortable Eyes: PERRL, lids and conjunctivae normal ENMT: Mucous membranes are moist. Posterior pharynx clear.  Neck: normal, supple, no masses, no thyromegaly Respiratory: clear to auscultation bilaterally, no wheezing, no crackles. Normal respiratory effort. No accessory muscle use.  Cardiovascular: Regular rate and rhythm, no murmurs / rubs / gallops. No extremity edema. 2+ pedal pulses.  Abdomen: no tenderness, no masses palpated. No hepatosplenomegaly. Bowel sounds positive.  Musculoskeletal: no clubbing / cyanosis. No joint deformity upper and lower extremities. Good ROM,  Skin: no rashes, lesions, ulcers. No induration Neurologic: Slow response to questions due  to dementia, although the Alert and oriented x 3 CN 2-12 grossly intact. Sensation intact, DTR normal. Strength 5/5 in all 4.  Psychiatric: Normal judgment and insight. Normal mood.    Labs on Admission: I have personally reviewed following labs and imaging studies  CBC:  Recent Labs Lab 02/22/16 1223  02/23/16 0645  WBC 7.0 7.3  HGB 14.4 13.9  HCT 41.1 41.0  MCV 83.3 84.7  PLT  --  [id number]   Basic Metabolic Panel:  Recent Labs Lab 02/22/16 1209 02/23/16 0645  NA 143 142  K 3.4* 2.9*  CL 98 103  CO2 25 28  GLUCOSE 107* 131*  BUN 20 20  CREATININE 0.79 0.86  CALCIUM 9.9 9.5   GFR: Estimated Creatinine Clearance: 62.1 mL/min (by C-G formula based on SCr of 0.86 mg/dL). Liver Function Tests:  Recent Labs Lab 02/22/16 1209 02/23/16 0645  AST 14 18  ALT 6 11*  ALKPHOS 63 56  BILITOT 0.5 0.6  PROT 7.9 8.1  ALBUMIN 4.4 4.2    Recent Labs Lab 02/23/16 0645  LIPASE 41   No results for input(s): AMMONIA in the last 168 hours. Coagulation Profile: No results for input(s): INR, PROTIME in the last 168 hours. Cardiac Enzymes: No results for input(s): CKTOTAL, CKMB, CKMBINDEX, TROPONINI in the last 168 hours. BNP (last 3 results) No results for input(s): PROBNP in the last 8760 hours. HbA1C: No results for input(s): HGBA1C in the last 72 hours. CBG: No results for input(s): GLUCAP in the last 168 hours. Lipid Profile: No results for input(s): CHOL, HDL, LDLCALC, TRIG, CHOLHDL, LDLDIRECT in the last 72 hours. Thyroid Function Tests:  Recent Labs  02/22/16 1209  TSH 1.180   Anemia Panel:  Recent Labs  02/22/16 1209  VITAMINB12 626   Urine analysis:    Component Value Date/Time   COLORURINE AMBER (A) 02/23/2016 0631   APPEARANCEUR CLOUDY (A) 02/23/2016 0631   LABSPEC 1.030 02/23/2016 0631   PHURINE 5.0 02/23/2016 0631   GLUCOSEU NEGATIVE 02/23/2016 0631   HGBUR SMALL (A) 02/23/2016 0631   BILIRUBINUR NEGATIVE 02/23/2016 0631   BILIRUBINUR moderate (A) 02/22/2016 1241   KETONESUR 20 (A) 02/23/2016 0631   PROTEINUR 30 (A) 02/23/2016 0631   UROBILINOGEN 1.0 02/22/2016 1241   NITRITE NEGATIVE 02/23/2016 0631   LEUKOCYTESUR LARGE (A) 02/23/2016 0631   Radiological Exams on Admission: Dg Chest 2 View  Result Date: 02/23/2016 CLINICAL DATA:  Right  upper quadrant abdominal pain EXAM: CHEST  2 VIEW COMPARISON:  None. FINDINGS: Heart and mediastinal contours are within normal limits. Nodular density projects laterally over the right mid lung. Lungs otherwise clear. No effusions or acute bony abnormality. IMPRESSION: Questionable right peripheral mid lung nodule. This could be further evaluated with noncontrast chest CT. Electronically Signed   By: Rolm Baptise M.D.   On: 02/23/2016 08:30   Ct Angio Chest Pe W And/or Wo Contrast  Result Date: 02/23/2016 CLINICAL DATA:  Positive D-dimer.  Pain under ribs. EXAM: CT ANGIOGRAPHY CHEST WITH CONTRAST TECHNIQUE: Multidetector CT imaging of the chest was performed using the standard protocol during bolus administration of intravenous contrast. Multiplanar CT image reconstructions and MIPs were obtained to evaluate the vascular anatomy. CONTRAST:  80 mL of Isovue 370 COMPARISON:  Chest x-ray from earlier today FINDINGS: Cardiovascular: Multiple bilateral pulmonary emboli are identified involving segmental branches in all lobes. The maximum cross-sectional dimension of the left ventricle is 4.3 cm. This compares to 3.7 cm in the right ventricle. No evidence of heart  strain identified. The heart is normal in size. The thoracic aorta is normal in caliber with no dissection. Mediastinum/Nodes: No enlarged mediastinal, hilar, or axillary lymph nodes. Thyroid gland, trachea, and esophagus demonstrate no significant findings. Lungs/Pleura: The central airways are within normal limits. No pneumothorax. The possible nodule in the right lung on the recent chest x-ray correlates with a sclerotic lesion in a right posterior rib on series 8, image 52. No suspicious pulmonary nodules, masses, or infiltrates. Upper Abdomen: Multiple liver cysts. No other abnormalities in the upper abdomen. Musculoskeletal: No acute bony abnormalities. Review of the MIP images confirms the above findings. IMPRESSION: 1. Multiple segmental branch  pulmonary emboli involving all lobes. No evidence of heart strain. 2. The possible lung nodule seen on the recent chest x-ray correlates with a sclerotic lesion in a right posterior rib, indeterminate but possibly a bone island. Findings discussed with Dr. Sherry Ruffing Electronically Signed   By: Dorise Bullion III M.D   On: 02/23/2016 13:48   Ct Abdomen Pelvis W Contrast  Result Date: 02/23/2016 CLINICAL DATA:  Patient with alzheimer's disease. Husband reports possible abdominal pain today. EXAM: CT ABDOMEN AND PELVIS WITH CONTRAST TECHNIQUE: Multidetector CT imaging of the abdomen and pelvis was performed using the standard protocol following bolus administration of intravenous contrast. CONTRAST:  119mL ISOVUE-300 IOPAMIDOL (ISOVUE-300) INJECTION 61% COMPARISON:  None. FINDINGS: Lower chest: No acute abnormality. Hepatobiliary: Multiple hypodense, fluid attenuating right hepatic masses with the largest measuring 3.1 x 2.8 cm consistent with cysts. No other focal liver abnormality is seen. No gallstones, gallbladder wall thickening, or biliary dilatation. Pancreas: Unremarkable. No pancreatic ductal dilatation or surrounding inflammatory changes. Spleen: Normal in size without focal abnormality. Adrenals/Urinary Tract: Adrenal glands are unremarkable. Kidneys are normal, without renal calculi, focal lesion, or hydronephrosis. Bladder is unremarkable. Stomach/Bowel: Stomach is within normal limits. Appendix appears normal. No evidence of bowel wall thickening, distention, or inflammatory changes. Diverticulosis without evidence of diverticulitis. Vascular/Lymphatic: No significant vascular findings are present. No enlarged abdominal or pelvic lymph nodes. Reproductive: Normal uterus. Bilobed fat attenuating right lower pelvic mass measuring approximately 4.8 x 4.8 cm with the smaller fat nodule demonstrating peripheral calcification. Larger fatty mass has a 18 mm soft tissue mural nodule or ovarian tissue. This may  reflect a teratoma or dermoid. Other: No abdominal wall hernia or abnormality. No abdominopelvic ascites. Musculoskeletal: No acute osseous abnormality. No lytic or sclerotic osseous lesion. IMPRESSION: 1. No acute abdominal or pelvic pathology. 2. Fat attenuating right lower pelvic mass likely arising from the ovary. 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 Electronically Signed   By: Kathreen Devoid   On: 02/23/2016 08:45    EKG: Independently reviewed. Sinus rhythm and no ST segment changes  Assessment/Plan Pulmonary emboli - unprovoked vs questionable mass in the ovary. CT abdomen and pelvis ? teratoma Admit to telemetry Heparin drip may transition to oral a/c in 24 hrs Doppler LE ECHO  Oxygen supplementation as needed   UTI - UA grossly abnormal Follow-up urine culture Agree with Keflex continue 500 twice a day  Hypokalemia K repleted Check BMP in a.m.  Alzhiemer's diseases - stable  Not on medications - husband stopped them due to side effects   Ovarian Mass can be follow-up as an outpatient  DVT prophylaxis: Heparin drip Code Status: Full Family Communication: Husband at bedside Disposition Plan: Anticipate discharge to previous home environment.  Consults called: None Admission status: Telemetry/inpatient   Chipper Oman MD [facility] Pager: Text Page via [email or web address]  [phone]  If 7PM-7AM, please contact night-coverage [email or web address] Password [email or web address]  02/23/2016, 4:20 PM

## 2016-02-23 NOTE — Progress Notes (Addendum)
ANTICOAGULATION CONSULT NOTE - Initial Consult  Pharmacy Consult for Heparin Indication: pulmonary embolus  Allergies  Allergen Reactions  . Sulfa Antibiotics Palpitations    Patient Measurements:   Heparin Dosing Weight: 72.2kg  Vital Signs: Temp: 98.3 F (36.8 C) (02/10 0549) Temp Source: Oral (02/10 0549) BP: 157/71 (02/10 1353) Pulse Rate: 95 (02/10 1353)  Labs:  Recent Labs  02/22/16 1209 02/22/16 1223 02/23/16 0645  HGB  --  14.4 13.9  HCT  --  41.1 41.0  PLT  --   --  130*  CREATININE 0.79  --  0.86    Estimated Creatinine Clearance: 62.1 mL/min (by C-G formula based on SCr of 0.86 mg/dL).   Medical History: Past Medical History:  Diagnosis Date  . Alzheimer's disease 12/28/2014  . Headache disorder 12/28/2014  . Memory disturbance 12/28/2014    Medications:  Infusions:    Assessment: 71 yo F with hx Alzheimer's Disease presented with abdominal pain.   Elevated D-dimer.  Chest CT + PE.  No evidence of heart strain.  No hx VTE or anticoagulants PTA per chart review.   CBC reviewed- Hg wnl, Pltc slightly low at 130.  No recent labs for comparison (last pltc=312 in Dec 2016).  No bleeding noted.   Goal of Therapy:  Heparin level 0.3-0.7 units/ml Monitor platelets by anticoagulation protocol: Yes   Plan:  Give 5000 units bolus x 1 Start heparin infusion at 1200 units/hr Check anti-Xa level in 6 hours and daily while on heparin Continue to monitor H&H and platelets  F/U long-term anticoagulation plan  Biagio Borg 02/23/2016,2:06 PM  Initial heparin level elevated at 0.99, however it was drawn 1.5hrs early so would be partially due to bolus effect.   RN reports no bleeding or infusion line issues.   Will decrease heparin infusion rate to 10.71ml/hr (2 unit/kg/hr decrease) and re-check 6hr heparin level.   Plan discussed with RN.    Netta Cedars, PharmD, BCPS Pager: [phone] 02/23/2016@8 :58 PM

## 2016-02-24 ENCOUNTER — Inpatient Hospital Stay (HOSPITAL_COMMUNITY): Payer: Medicare Other

## 2016-02-24 DIAGNOSIS — I2782 Chronic pulmonary embolism: Secondary | ICD-10-CM

## 2016-02-24 DIAGNOSIS — I2699 Other pulmonary embolism without acute cor pulmonale: Principal | ICD-10-CM

## 2016-02-24 DIAGNOSIS — N838 Other noninflammatory disorders of ovary, fallopian tube and broad ligament: Secondary | ICD-10-CM | POA: Diagnosis present

## 2016-02-24 DIAGNOSIS — F0280 Dementia in other diseases classified elsewhere without behavioral disturbance: Secondary | ICD-10-CM

## 2016-02-24 DIAGNOSIS — G300 Alzheimer's disease with early onset: Secondary | ICD-10-CM

## 2016-02-24 DIAGNOSIS — N839 Noninflammatory disorder of ovary, fallopian tube and broad ligament, unspecified: Secondary | ICD-10-CM

## 2016-02-24 DIAGNOSIS — I82411 Acute embolism and thrombosis of right femoral vein: Secondary | ICD-10-CM | POA: Diagnosis present

## 2016-02-24 HISTORY — DX: Acute embolism and thrombosis of right femoral vein: I82.411

## 2016-02-24 LAB — COMPREHENSIVE METABOLIC PANEL
ALK PHOS: 47 U/L (ref 38–126)
ALT: 9 U/L — AB (ref 14–54)
AST: 15 U/L (ref 15–41)
Albumin: 3.5 g/dL (ref 3.5–5.0)
Anion gap: 6 (ref 5–15)
BILIRUBIN TOTAL: 0.6 mg/dL (ref 0.3–1.2)
BUN: 13 mg/dL (ref 6–20)
CALCIUM: 8.6 mg/dL — AB (ref 8.9–10.3)
CO2: 26 mmol/L (ref 22–32)
CREATININE: 0.68 mg/dL (ref 0.44–1.00)
Chloride: 112 mmol/L — ABNORMAL HIGH (ref 101–111)
Glucose, Bld: 99 mg/dL (ref 65–99)
Potassium: 4.3 mmol/L (ref 3.5–5.1)
Sodium: 144 mmol/L (ref 135–145)
Total Protein: 6.7 g/dL (ref 6.5–8.1)

## 2016-02-24 LAB — CBC
HEMATOCRIT: 36.2 % (ref 36.0–46.0)
HEMOGLOBIN: 11.7 g/dL — AB (ref 12.0–15.0)
MCH: 27.7 pg (ref 26.0–34.0)
MCHC: 32.3 g/dL (ref 30.0–36.0)
MCV: 85.6 fL (ref 78.0–100.0)
Platelets: 126 10*3/uL — ABNORMAL LOW (ref 150–400)
RBC: 4.23 MIL/uL (ref 3.87–5.11)
RDW: 14 % (ref 11.5–15.5)
WBC: 5.2 10*3/uL (ref 4.0–10.5)

## 2016-02-24 LAB — ECHOCARDIOGRAM COMPLETE
CHL CUP MV DEC (S): 292
E/e' ratio: 6.09
EWDT: 292 ms
Height: 68 in
LA vol A4C: 30.5 ml
LA vol index: 20.7 mL/m2
LAVOL: 38.7 mL
LDCA: 3.8 cm2
LV E/e' medial: 6.09
LV E/e'average: 6.09
LV TDI E'LATERAL: 6.85
LV TDI E'MEDIAL: 5.44
LVELAT: 6.85 cm/s
LVOT VTI: 17.8 cm
LVOT diameter: 22 mm
LVOTPV: 90.2 cm/s
LVOTSV: 68 mL
MV pk A vel: 66.9 m/s
MV pk E vel: 41.7 m/s
Reg peak vel: 228 cm/s
TRMAXVEL: 228 cm/s
Weight: 2589.08 oz

## 2016-02-24 LAB — HEPARIN LEVEL (UNFRACTIONATED)
HEPARIN UNFRACTIONATED: 1.18 [IU]/mL — AB (ref 0.30–0.70)
Heparin Unfractionated: <0.1 IU/mL — ABNORMAL LOW (ref 0.30–0.70)

## 2016-02-24 MED ORDER — RIVAROXABAN 15 MG PO TABS
15.0000 mg | ORAL_TABLET | Freq: Two times a day (BID) | ORAL | Status: DC
  Administered 2016-02-24 – 2016-02-25 (×2): 15 mg via ORAL
  Filled 2016-02-24 (×2): qty 1

## 2016-02-24 MED ORDER — HEPARIN BOLUS VIA INFUSION
3000.0000 [IU] | Freq: Once | INTRAVENOUS | Status: AC
  Administered 2016-02-24: 3000 [IU] via INTRAVENOUS
  Filled 2016-02-24: qty 3000

## 2016-02-24 MED ORDER — RIVAROXABAN 20 MG PO TABS: 20.0000 mg | ORAL_TABLET | Freq: Every day | ORAL | Status: DC

## 2016-02-24 NOTE — Progress Notes (Signed)
  Echocardiogram 2D Echocardiogram has been performed.  Tresa Res 02/24/2016, 11:41 AM

## 2016-02-24 NOTE — Progress Notes (Addendum)
*  Preliminary Results* Bilateral lower extremity venous duplex completed. The right lower extremity is positive for acute deep vein thrombosis involving the distal segment of the right femoral vein and the right peroneal veins. The left lower extremity is negative for deep vein thrombosis. There is no evidence of Baker's cyst bilaterally.  Preliminary results discussed with Dr. Bonner Puna.  02/24/2016 9:21 AM Maudry Mayhew, BS, RVT, RDCS, RDMS

## 2016-02-24 NOTE — Progress Notes (Signed)
PROGRESS NOTE  Tammy [name]  [id number] DOB: 08/14/1946 DOA: 02/23/2016 PCP: Tammy Cheadle, MD   Brief Narrative: Tammy [name] is a 71 y.o. female with a history of Alzheimer's disease sent to the ED complaining of lower right chest pain below the right breast for the past 4 days that began after a road trip to [place]. Patient was seen 3 days ago in urgent care for the same labs were normal and was sent home with pain medication. This morning she started to have an intense persistent pain that did not resolve with ibuprofen. She denies shortness of breast, palpitations, diaphoresis and dizziness. Per husband who provides the majority of the history patient hasn't had any recent travel, no recent hospitalizations or surgery, not really sedentary, active at home. Nonsmoker. On arrival, D-dimer was found to be > 20, CTA chest showed multiple segmental branch pulmonary emboli involving all lobes and subsequent LE doppler found right femoral and peroneal DVTs. Echocardiogram and CTA showed no signs of heart strain. She remains in no respiratory distress without hypoxemia. A heparin infusion was started after CT head negative for hemorrhage, and this was transitioned to xarelto. Physical therapy evaluation is pending. Urinalysis was also grossly abnormal and keflex was empirically started.   Assessment & Plan: Principal Problem:   Pulmonary emboli (HCC) Active Problems:   Alzheimer's disease   Right femoral vein DVT (HCC)   Ovarian mass   Pulmonary emboli and right femoral and peroneal DVTs: Possibly provoked by travel to [facility] by car. Echocardiogram and CTA chest showed no evidence of heart strain. PA pressure normal, RV systolic function low-normal. No hypoxemia or respiratory distress, though patient has not attempted exertion. No h/o GI bleed.  - Transition heparin gtt to xarelto.  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 With  19lbs reported weight loss since December. Discussed with Dr. Alen Blew, oncology, who doesn't believe this would be provocative factor for thrombosis.  - Will follow up with gyn as outpatient - Nutrition consult for the weight loss (uncertain if due to catabolic mass or poor po due to dementia progression).   Abnormal urinalysis: UA grossly abnormal with non-clean catch specimen. No mental status changes, fever, abd pain, dysuria, or change in urination.  - Will stop abx pending culture data.   Hypokalemia: Mild, resolved with repletion, presumably due to poor po.   Alzhiemer's disease: Side effects limited medications. Cared for by her husband at home.   DVT prophylaxis: Therapeutic heparin Code Status: Full Family Communication: Husband at bedside Disposition Plan: Anticipate DC to home  Consultants:   None  Procedures:   Echocardiogram 02/24/2016:  Study Conclusions  - Left ventricle: The cavity size was normal. Wall thickness was   normal. Systolic function was normal. The estimated ejection   fraction was in the range of 60% to 65%. Wall motion was normal;   there were no regional wall motion abnormalities. Doppler   parameters are consistent with abnormal left ventricular   relaxation (grade 1 diastolic dysfunction). - Right ventricle: Systolic function was low normal. - Right atrium: The atrium was mildly dilated. - Tricuspid valve: There was trivial regurgitation. - Pericardium, extracardiac: A small pericardial effusion was   identified along the right ventricular free wall. There was no   evidence of hemodynamic compromise. - PA pressure normal  Antimicrobials:  Keflex 2/10   Subjective: Pt confused at baseline per husband. Denies dyspnea. Reports chest pain is stable.  Objective: Vitals:   02/23/16 1740 02/23/16 2008 02/24/16 0617 02/24/16 1428  BP: 133/86 93/64 119/75 (!) 143/79  Pulse: 70 80 84 88  Resp: 13 17 18 18   Temp: 97.9 F (36.6 C) 98.7 F  (37.1 C) 98.2 F (36.8 C) 98.2 F (36.8 C)  TempSrc: Oral Oral Oral Oral  SpO2: 99% 100% 100% 100%  Weight: 73.4 kg (161 lb 13.1 oz)     Height: 5\' 8"  (1.727 m)       Intake/Output Summary (Last 24 hours) at 02/24/16 1453 Last data filed at 02/24/16 1357  Gross per 24 hour  Intake           248.53 ml  Output              900 ml  Net          -651.47 ml   Filed Weights   02/23/16 1740  Weight: 73.4 kg (161 lb 13.1 oz)    Examination: General exam: 71 y.o. female in no distress Respiratory system: Non-labored breathing on room air. Clear to auscultation bilaterally.  Cardiovascular system: Regular rate and rhythm. No murmur, rub, or gallop. No JVD, and no pedal edema. Gastrointestinal system: Abdomen soft, non-tender, non-distended, with normoactive bowel sounds. No organomegaly or masses felt. Central nervous system: Alert, not oriented. No focal neurological deficits. Extremities: Warm, no deformities. +trace RLE edema without tenderness. Skin: No rashes, lesions no ulcers Psychiatry: Judgement and insight appear normal. Mood & affect appropriate.   Data Reviewed: I have personally reviewed following labs and imaging studies  CBC:  Recent Labs Lab 02/22/16 1223 02/23/16 0645 02/24/16 0449  WBC 7.0 7.3 5.2  HGB 14.4 13.9 11.7*  HCT 41.1 41.0 36.2  MCV 83.3 84.7 85.6  PLT  --  130* [id number]*   Basic Metabolic Panel:  Recent Labs Lab 02/22/16 1209 02/23/16 0645 02/24/16 0449  NA 143 142 144  K 3.4* 2.9* 4.3  CL 98 103 112*  CO2 25 28 26   GLUCOSE 107* 131* 99  BUN 20 20 13   CREATININE 0.79 0.86 0.68  CALCIUM 9.9 9.5 8.6*   GFR: Estimated Creatinine Clearance: 67.9 mL/min (by C-G formula based on SCr of 0.68 mg/dL). Liver Function Tests:  Recent Labs Lab 02/22/16 1209 02/23/16 0645 02/24/16 0449  AST 14 18 15   ALT 6 11* 9*  ALKPHOS 63 56 47  BILITOT 0.5 0.6 0.6  PROT 7.9 8.1 6.7  ALBUMIN 4.4 4.2 3.5    Recent Labs Lab 02/23/16 0645  LIPASE 41     No results for input(s): AMMONIA in the last 168 hours. Coagulation Profile: No results for input(s): INR, PROTIME in the last 168 hours. Cardiac Enzymes: No results for input(s): CKTOTAL, CKMB, CKMBINDEX, TROPONINI in the last 168 hours. BNP (last 3 results) No results for input(s): PROBNP in the last 8760 hours. HbA1C: No results for input(s): HGBA1C in the last 72 hours. CBG: No results for input(s): GLUCAP in the last 168 hours. Lipid Profile: No results for input(s): CHOL, HDL, LDLCALC, TRIG, CHOLHDL, LDLDIRECT in the last 72 hours. Thyroid Function Tests:  Recent Labs  02/22/16 1209  TSH 1.180   Anemia Panel:  Recent Labs  02/22/16 1209  VITAMINB12 626   Urine analysis:    Component Value Date/Time   COLORURINE AMBER (A) 02/23/2016 0631   APPEARANCEUR CLOUDY (A) 02/23/2016 0631   LABSPEC 1.030 02/23/2016 0631   PHURINE 5.0 02/23/2016 0631   GLUCOSEU NEGATIVE 02/23/2016 0631   HGBUR SMALL (A)  02/23/2016 0631   BILIRUBINUR NEGATIVE 02/23/2016 0631   BILIRUBINUR moderate (A) 02/22/2016 1241   KETONESUR 20 (A) 02/23/2016 0631   PROTEINUR 30 (A) 02/23/2016 0631   UROBILINOGEN 1.0 02/22/2016 1241   NITRITE NEGATIVE 02/23/2016 0631   LEUKOCYTESUR LARGE (A) 02/23/2016 0631   Recent Results (from the past 240 hour(s))  Urine culture     Status: None   Collection Time: 02/22/16 12:09 PM  Result Value Ref Range Status   Urine Culture, Routine Final report  Final   Urine Culture result 1 Comment  Final    Comment: Culture shows less than 10,000 colony forming units of bacteria per milliliter of urine. This colony count is not generally considered to be clinically significant.       Radiology Studies: Dg Chest 2 View  Result Date: 02/23/2016 CLINICAL DATA:  Right upper quadrant abdominal pain EXAM: CHEST  2 VIEW COMPARISON:  None. FINDINGS: Heart and mediastinal contours are within normal limits. Nodular density projects laterally over the right mid lung. Lungs  otherwise clear. No effusions or acute bony abnormality. IMPRESSION: Questionable right peripheral mid lung nodule. This could be further evaluated with noncontrast chest CT. Electronically Signed   By: Rolm Baptise M.D.   On: 02/23/2016 08:30   Ct Angio Chest Pe W And/or Wo Contrast  Result Date: 02/23/2016 CLINICAL DATA:  Positive D-dimer.  Pain under ribs. EXAM: CT ANGIOGRAPHY CHEST WITH CONTRAST TECHNIQUE: Multidetector CT imaging of the chest was performed using the standard protocol during bolus administration of intravenous contrast. Multiplanar CT image reconstructions and MIPs were obtained to evaluate the vascular anatomy. CONTRAST:  80 mL of Isovue 370 COMPARISON:  Chest x-ray from earlier today FINDINGS: Cardiovascular: Multiple bilateral pulmonary emboli are identified involving segmental branches in all lobes. The maximum cross-sectional dimension of the left ventricle is 4.3 cm. This compares to 3.7 cm in the right ventricle. No evidence of heart strain identified. The heart is normal in size. The thoracic aorta is normal in caliber with no dissection. Mediastinum/Nodes: No enlarged mediastinal, hilar, or axillary lymph nodes. Thyroid gland, trachea, and esophagus demonstrate no significant findings. Lungs/Pleura: The central airways are within normal limits. No pneumothorax. The possible nodule in the right lung on the recent chest x-ray correlates with a sclerotic lesion in a right posterior rib on series 8, image 52. No suspicious pulmonary nodules, masses, or infiltrates. Upper Abdomen: Multiple liver cysts. No other abnormalities in the upper abdomen. Musculoskeletal: No acute bony abnormalities. Review of the MIP images confirms the above findings. IMPRESSION: 1. Multiple segmental branch pulmonary emboli involving all lobes. No evidence of heart strain. 2. The possible lung nodule seen on the recent chest x-ray correlates with a sclerotic lesion in a right posterior rib, indeterminate but  possibly a bone island. Findings discussed with Dr. Sherry Ruffing Electronically Signed   By: Dorise Bullion III M.D   On: 02/23/2016 13:48   Ct Abdomen Pelvis W Contrast  Result Date: 02/23/2016 CLINICAL DATA:  Patient with alzheimer's disease. Husband reports possible abdominal pain today. EXAM: CT ABDOMEN AND PELVIS WITH CONTRAST TECHNIQUE: Multidetector CT imaging of the abdomen and pelvis was performed using the standard protocol following bolus administration of intravenous contrast. CONTRAST:  174mL ISOVUE-300 IOPAMIDOL (ISOVUE-300) INJECTION 61% COMPARISON:  None. FINDINGS: Lower chest: No acute abnormality. Hepatobiliary: Multiple hypodense, fluid attenuating right hepatic masses with the largest measuring 3.1 x 2.8 cm consistent with cysts. No other focal liver abnormality is seen. No gallstones, gallbladder wall thickening, or biliary  dilatation. Pancreas: Unremarkable. No pancreatic ductal dilatation or surrounding inflammatory changes. Spleen: Normal in size without focal abnormality. Adrenals/Urinary Tract: Adrenal glands are unremarkable. Kidneys are normal, without renal calculi, focal lesion, or hydronephrosis. Bladder is unremarkable. Stomach/Bowel: Stomach is within normal limits. Appendix appears normal. No evidence of bowel wall thickening, distention, or inflammatory changes. Diverticulosis without evidence of diverticulitis. Vascular/Lymphatic: No significant vascular findings are present. No enlarged abdominal or pelvic lymph nodes. Reproductive: Normal uterus. Bilobed fat attenuating right lower pelvic mass measuring approximately 4.8 x 4.8 cm with the smaller fat nodule demonstrating peripheral calcification. Larger fatty mass has a 18 mm soft tissue mural nodule or ovarian tissue. This may reflect a teratoma or dermoid. Other: No abdominal wall hernia or abnormality. No abdominopelvic ascites. Musculoskeletal: No acute osseous abnormality. No lytic or sclerotic osseous lesion. IMPRESSION:  1. No acute abdominal or pelvic pathology. 2. Fat attenuating right lower pelvic mass likely arising from the ovary. 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 Electronically Signed   By: Kathreen Devoid   On: 02/23/2016 08:45    Scheduled Meds: Continuous Infusions: . heparin 1,050 Units/hr (02/24/16 0609)     LOS: 1 day   Time spent: 25 minutes.  Vance Gather, MD [facility] Pager [phone]  If 7PM-7AM, please contact night-coverage [email or web address] Password [facility] 02/24/2016, 2:53 PM

## 2016-02-24 NOTE — Progress Notes (Signed)
ANTICOAGULATION CONSULT NOTE - Follow Up Consult  Pharmacy Consult for Xarelto Indication: pulmonary embolus  Allergies  Allergen Reactions  . Sulfa Antibiotics Palpitations    Patient Measurements: Height: 5\' 8"  (172.7 cm) Weight: 161 lb 13.1 oz (73.4 kg) IBW/kg (Calculated) : 63.9 Heparin Dosing Weight: 73.4 kg  Vital Signs: Temp: 98.2 F (36.8 C) (02/11 0617) Temp Source: Oral (02/11 0617) BP: 119/75 (02/11 0617) Pulse Rate: 84 (02/11 0617)  Labs:  Recent Labs  02/22/16 1209 02/22/16 1223 02/23/16 0645 02/23/16 1927 02/24/16 0449  HGB  --  14.4 13.9  --  11.7*  HCT  --  41.1 41.0  --  36.2  PLT  --   --  130*  --  126*  HEPARINUNFRC  --   --   --  0.99* <0.10*  CREATININE 0.79  --  0.86  --  0.68    Estimated Creatinine Clearance: 67.9 mL/min (by C-G formula based on SCr of 0.68 mg/dL).   Medications:  Infusions:  . heparin 1,050 Units/hr (02/24/16 [id number])    Assessment: 5 yoF presented to ED on 2/10 with abdominal pain, poor PO intake, and fatigue.  D-dimer elevated and CTa positive for PE.  No hx VTE or anticoagulants PTA per chart review.  Pharmacy was initially consulted to dose Heparin IV, now consulted to dose Xarelto.  Significant events: 2/10 First HL (drawn early) elevated at 0.99, may reflect bolus dosing.  Rate reduced. 2/11 Second HL low, but RN reports IV infiltrated, unknown duration.  Re-bolus and continue current rate.  Today, 02/24/2016:  Heparin level 1.18, supratherapeutic on heparin at 1050 units/hr  CBC:  Hgb decreased to 11.7, Plt low/stable at 126k  No bleeding or complications reported.  SCr 0.68, CrCl ~ 68 ml/min   Goal of Therapy:  Heparin level 0.3-0.7 units/ml Monitor platelets by anticoagulation protocol: Yes   Plan:   Discontinue Heparin drip, wait ~1 hour before starting Xarelto due to supratherapeutic level.  Start Xarelto 15mg  PO BID with meals x21 days, then 20mg  PO once daily with supper.  Follow up  [place] to provide education prior to discharge.  For discharge, recommend ordering the "Xarelto Starter Pack"   Gretta Arab PharmD, BCPS Pager [phone] 02/24/2016 3:09 PM

## 2016-02-24 NOTE — Progress Notes (Signed)
ANTICOAGULATION CONSULT NOTE - f/u Consult  Pharmacy Consult for Heparin Indication: pulmonary embolus  Allergies  Allergen Reactions  . Sulfa Antibiotics Palpitations    Patient Measurements: Height: 5\' 8"  (172.7 cm) Weight: 161 lb 13.1 oz (73.4 kg) IBW/kg (Calculated) : 63.9 Heparin Dosing Weight: 72.2kg  Vital Signs: Temp: 98.2 F (36.8 C) (02/11 0617) Temp Source: Oral (02/11 0617) BP: 119/75 (02/11 0617) Pulse Rate: 84 (02/11 0617)  Labs:  Recent Labs  02/22/16 1209 02/22/16 1223 02/23/16 0645 02/23/16 1927 02/24/16 0449  HGB  --  14.4 13.9  --  11.7*  HCT  --  41.1 41.0  --  36.2  PLT  --   --  130*  --  126*  HEPARINUNFRC  --   --   --  0.99* <0.10*  CREATININE 0.79  --  0.86  --  0.68    Estimated Creatinine Clearance: 67.9 mL/min (by C-G formula based on SCr of 0.68 mg/dL).   Medical History: Past Medical History:  Diagnosis Date  . Alzheimer's disease 12/28/2014  . Headache disorder 12/28/2014  . Memory disturbance 12/28/2014    Medications:  Infusions:  . heparin 1,050 Units/hr (02/24/16 [id number])    Assessment: 71 yo F with hx Alzheimer's Disease presented with abdominal pain.   Elevated D-dimer.  Chest CT + PE.  No evidence of heart strain.  No hx VTE or anticoagulants PTA per chart review.   CBC reviewed- Hg wnl, Pltc slightly low at 130.  No recent labs for comparison (last pltc=312 in Dec 2016).  No bleeding noted.   Today, 2/11 -RN called ~0600 that she discovered IV had infiltrated (unsure how long) -0449 HL=<0.10- assume low b/c of infiltration  Goal of Therapy:  Heparin level 0.3-0.7 units/ml Monitor platelets by anticoagulation protocol: Yes   Plan:   Will rebolus with 3000 units x1 now Continue heparin drip at 1050 units/hr Recheck HL in 6 hours F/U long-term anticoagulation plan  Dorrene German 02/24/2016,6:33 AM  I

## 2016-02-25 LAB — CBC
HEMATOCRIT: 35.6 % — AB (ref 36.0–46.0)
HEMOGLOBIN: 11.7 g/dL — AB (ref 12.0–15.0)
MCH: 28.1 pg (ref 26.0–34.0)
MCHC: 32.9 g/dL (ref 30.0–36.0)
MCV: 85.6 fL (ref 78.0–100.0)
Platelets: 143 10*3/uL — ABNORMAL LOW (ref 150–400)
RBC: 4.16 MIL/uL (ref 3.87–5.11)
RDW: 13.9 % (ref 11.5–15.5)
WBC: 5.1 10*3/uL (ref 4.0–10.5)

## 2016-02-25 MED ORDER — RIVAROXABAN (XARELTO) VTE STARTER PACK (15 & 20 MG): ORAL_TABLET | ORAL | 0 refills | Status: DC

## 2016-02-25 NOTE — Evaluation (Signed)
Physical Therapy Evaluation-1x Patient Details Name: TALEESA FLUEGEL MRN: [id number] DOB: 08/14/1946 Today's Date: 02/25/2016   History of Present Illness  71 yo female admitted with PE, R LE DVTs. Hx of Alz.   Clinical Impression  On eval, pt was Ind with mobility. She walked ~500 feet around unit. HR up to 130s. No dyspnea noted (pt denied this as well). No LOB. Husband present during session. No PT needs. 1x eval. Will sign off.     Follow Up Recommendations No PT follow up;Supervision/Assistance - 24 hour (supervision due to Alz)    Equipment Recommendations  None recommended by PT    Recommendations for Other Services       Precautions / Restrictions Precautions Precautions: None Restrictions Weight Bearing Restrictions: No      Mobility  Bed Mobility Overal bed mobility: Independent                Transfers Overall transfer level: Independent                  Ambulation/Gait Ambulation/Gait assistance: Independent Ambulation Distance (Feet): 500 Feet Assistive device: None Gait Pattern/deviations: WFL(Within Functional Limits)     General Gait Details: HR up to 130s. No dyspnea. No LOB  Stairs            Wheelchair Mobility    Modified Rankin (Stroke Patients Only)       Balance                                             Pertinent Vitals/Pain Pain Assessment: No/denies pain    Home Living Family/patient expects to be discharged to:: Private residence Living Arrangements: Spouse/significant other Available Help at Discharge: Family Type of Home: House Home Access: Stairs to enter   Technical brewer of Steps: 1 Home Layout: Able to live on main level with bedroom/bathroom;Two level Home Equipment: None      Prior Function Level of Independence: Independent               Hand Dominance        Extremity/Trunk Assessment   Upper Extremity Assessment Upper Extremity Assessment: Overall WFL  for tasks assessed    Lower Extremity Assessment Lower Extremity Assessment: Overall WFL for tasks assessed    Cervical / Trunk Assessment Cervical / Trunk Assessment: Normal  Communication   Communication: No difficulties  Cognition Arousal/Alertness: Awake/alert Behavior During Therapy: WFL for tasks assessed/performed Overall Cognitive Status: History of cognitive impairments - at baseline                      General Comments      Exercises     Assessment/Plan    PT Assessment Patent does not need any further PT services  PT Problem List            PT Treatment Interventions      PT Goals (Current goals can be found in the Care Plan section)  Acute Rehab PT Goals Patient Stated Goal: home today per husband PT Goal Formulation: All assessment and education complete, DC therapy    Frequency     Barriers to discharge        Co-evaluation               End of Session Equipment Utilized During Treatment: Gait belt Activity Tolerance: Patient tolerated treatment  well Patient left: in chair;with call bell/phone within reach;with family/visitor present           Time: 0920-0929 PT Time Calculation (min) (ACUTE ONLY): 9 min   Charges:   PT Evaluation $PT Eval Low Complexity: 1 Procedure     PT G Codes:        Weston Anna, MPT Pager: [phone]

## 2016-02-25 NOTE — Telephone Encounter (Signed)
Dr. Willis- FYI 

## 2016-02-25 NOTE — Discharge Summary (Signed)
Physician Discharge Summary  [name] [id number] DOB: 1946/10/20 DOA: 02/23/2016  PCP: Delman Cheadle, MD  Admit date: 02/23/2016 Discharge date: 02/25/2016  Admitted From: Home Disposition: Home   Recommendations for Outpatient Follow-up:  1. Follow up with PCP in 1-2 weeks.  2. Discharged with xarelto starter pack for pulmonary emboli and right femoral and peroneal DVT. 3. Follow up with PCP and/or gynecology for evaluation of right ovarian mass seen on CT imaging as below.  Home Health: None recommended Equipment/Devices: None recommended Discharge Condition: Stable CODE STATUS: Full Diet recommendation: Regular  Brief/Interim Summary: Tammy M Izzardis a 71 y.o.femalewith a history of Alzheimer's disease sent to the ED complaining of lower right chest pain below the right breast for the past 4 days that began after a road trip to [place]. Patient was seen 3 days prior in urgent care for the same labs were normal and was sent home with pain medication. This morning she started to have an intensepersistent pain that did not resolve with ibuprofen. She denies shortness of breast, palpitations, diaphoresis and dizziness. On arrival, D-dimer was found to be > 20, CTA chest showed multiple segmental branch pulmonary emboli involving all lobes and subsequent LE doppler found right femoral and peroneal DVTs. Echocardiogram and CTA showed no signs of heart strain. She remains in no respiratory distress without hypoxemia. A heparin infusion was started after CT head negative for hemorrhage, and this was transitioned to xarelto, a starter pack is prescribed at discharge. Urinalysis was also grossly abnormal and keflex was empirically started, though it was not a clean catch and she's displayed no symptoms, so this was held and urine culture returned with insignificant growth. She is discharged back to home in stable condition.  Discharge Diagnoses:  Principal Problem:   Pulmonary emboli  (HCC) Active Problems:   Alzheimer's disease   Right femoral vein DVT (HCC)   Ovarian mass  Pulmonary emboli and right femoral and peroneal DVTs: Possibly provoked by travel to [facility] by car. Echocardiogram and CTA chest showed no evidence of heart strain. PA pressure normal, RV systolic function low-normal. No hypoxemia or respiratory distress. No history of bleeding. - Xarelto starter pack prescribed, will get this from outpatient pharmacy and will need to continue x3 - 6 months.  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 With 19lbs reported weight loss since December. Discussed with Dr. Alen Blew, oncology, who doesn't believe this would be provocative factor for thrombosis and that no urgent biopsy is warranted.  - Will follow up with gyn as outpatient - Nutrition consulted for weight loss  Abnormal urinalysis: UA grossly abnormal with non-clean catch specimen. No mental status changes, fever, abd pain, dysuria, or change in urination.  - Stopped abx pending culture data which has resulted negative.   Hypokalemia: Mild, resolved with repletion, presumably due to poor po.   Alzhiemer's disease: Side effects limited medications. Cared for by her husband at home.   Discharge Instructions Discharge Instructions    Discharge instructions    Complete by:  As directed    [name] was admitted with right lower chest pain due to blood clots in the lungs. These are fortunately not causing too much trouble breathing and the pain will slowly improve over the next several weeks. You may be discharged with the following recommendations:  - START taking xarelto as directed. You will take 15mg  tablets twice daily for 21 days, then take 20mg  daily after that.  - Follow up with Dr. Brigitte Pulse in the  next week or two for hospital follow up.  - If your symptoms return or worsen, seek medical attention right away.  - Physical therapy recommended 24 hour supervision but no need for ongoing  therapy.     Allergies as of 02/25/2016      Reactions   Sulfa Antibiotics Palpitations      Medication List    TAKE these medications   donepezil 10 MG tablet Commonly known as:  ARICEPT Take 1 tablet (10 mg total) by mouth at bedtime.   naproxen 500 MG tablet Commonly known as:  NAPROSYN Take by mouth. As needed   Rivaroxaban 15 & 20 MG Tbpk Take as directed on package: Start with one 15mg  tablet by mouth twice a day with food. On Day 22, switch to one 20mg  tablet once a day with food.      Follow-up Information    SHAW,EVA, MD. Schedule an appointment as soon as possible for a visit in 1 week(s).   Specialty:  Family Medicine Contact information: [address] [place] [id number] [phone]          Allergies  Allergen Reactions  . Sulfa Antibiotics Palpitations    Consultations:  None  Procedures/Studies: Dg Chest 2 View  Result Date: 02/23/2016 CLINICAL DATA:  Right upper quadrant abdominal pain EXAM: CHEST  2 VIEW COMPARISON:  None. FINDINGS: Heart and mediastinal contours are within normal limits. Nodular density projects laterally over the right mid lung. Lungs otherwise clear. No effusions or acute bony abnormality. IMPRESSION: Questionable right peripheral mid lung nodule. This could be further evaluated with noncontrast chest CT. Electronically Signed   By: Rolm Baptise M.D.   On: 02/23/2016 08:30   Ct Angio Chest Pe W And/or Wo Contrast  Result Date: 02/23/2016 CLINICAL DATA:  Positive D-dimer.  Pain under ribs. EXAM: CT ANGIOGRAPHY CHEST WITH CONTRAST TECHNIQUE: Multidetector CT imaging of the chest was performed using the standard protocol during bolus administration of intravenous contrast. Multiplanar CT image reconstructions and MIPs were obtained to evaluate the vascular anatomy. CONTRAST:  80 mL of Isovue 370 COMPARISON:  Chest x-ray from earlier today FINDINGS: Cardiovascular: Multiple bilateral pulmonary emboli are identified involving  segmental branches in all lobes. The maximum cross-sectional dimension of the left ventricle is 4.3 cm. This compares to 3.7 cm in the right ventricle. No evidence of heart strain identified. The heart is normal in size. The thoracic aorta is normal in caliber with no dissection. Mediastinum/Nodes: No enlarged mediastinal, hilar, or axillary lymph nodes. Thyroid gland, trachea, and esophagus demonstrate no significant findings. Lungs/Pleura: The central airways are within normal limits. No pneumothorax. The possible nodule in the right lung on the recent chest x-ray correlates with a sclerotic lesion in a right posterior rib on series 8, image 52. No suspicious pulmonary nodules, masses, or infiltrates. Upper Abdomen: Multiple liver cysts. No other abnormalities in the upper abdomen. Musculoskeletal: No acute bony abnormalities. Review of the MIP images confirms the above findings. IMPRESSION: 1. Multiple segmental branch pulmonary emboli involving all lobes. No evidence of heart strain. 2. The possible lung nodule seen on the recent chest x-ray correlates with a sclerotic lesion in a right posterior rib, indeterminate but possibly a bone island. Findings discussed with Dr. Sherry Ruffing Electronically Signed   By: Dorise Bullion III M.D   On: 02/23/2016 13:48   Ct Abdomen Pelvis W Contrast  Result Date: 02/23/2016 CLINICAL DATA:  Patient with alzheimer's disease. Husband reports possible abdominal pain today. EXAM: CT ABDOMEN AND  PELVIS WITH CONTRAST TECHNIQUE: Multidetector CT imaging of the abdomen and pelvis was performed using the standard protocol following bolus administration of intravenous contrast. CONTRAST:  183mL ISOVUE-300 IOPAMIDOL (ISOVUE-300) INJECTION 61% COMPARISON:  None. FINDINGS: Lower chest: No acute abnormality. Hepatobiliary: Multiple hypodense, fluid attenuating right hepatic masses with the largest measuring 3.1 x 2.8 cm consistent with cysts. No other focal liver abnormality is seen. No  gallstones, gallbladder wall thickening, or biliary dilatation. Pancreas: Unremarkable. No pancreatic ductal dilatation or surrounding inflammatory changes. Spleen: Normal in size without focal abnormality. Adrenals/Urinary Tract: Adrenal glands are unremarkable. Kidneys are normal, without renal calculi, focal lesion, or hydronephrosis. Bladder is unremarkable. Stomach/Bowel: Stomach is within normal limits. Appendix appears normal. No evidence of bowel wall thickening, distention, or inflammatory changes. Diverticulosis without evidence of diverticulitis. Vascular/Lymphatic: No significant vascular findings are present. No enlarged abdominal or pelvic lymph nodes. Reproductive: Normal uterus. Bilobed fat attenuating right lower pelvic mass measuring approximately 4.8 x 4.8 cm with the smaller fat nodule demonstrating peripheral calcification. Larger fatty mass has a 18 mm soft tissue mural nodule or ovarian tissue. This may reflect a teratoma or dermoid. Other: No abdominal wall hernia or abnormality. No abdominopelvic ascites. Musculoskeletal: No acute osseous abnormality. No lytic or sclerotic osseous lesion. IMPRESSION: 1. No acute abdominal or pelvic pathology. 2. Fat attenuating right lower pelvic mass likely arising from the ovary. 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 Electronically Signed   By: Kathreen Devoid   On: 02/23/2016 08:45      Subjective: Pt without complaints, at mental baseline per husband. Got up with PT without weakness or dyspnea.  Discharge Exam: Vitals:   02/24/16 2011 02/25/16 0419  BP: 105/72 132/89  Pulse: 83 (!) 101  Resp: 18 19  Temp: 98.4 F (36.9 C) 98 F (36.7 C)   General: Pt is alert, awake, not in acute distress Cardiovascular: RRR, S1/S2 +, no rubs, no gallops Respiratory: Nonlabored, moving air well, clear bilaterally Abdominal: Soft, NT, ND, bowel sounds + Extremities: Trace RLE edema, no cyanosis  The results of significant diagnostics from  this hospitalization (including imaging, microbiology, ancillary and laboratory) are listed below for reference.    Labs: Basic Metabolic Panel:  Recent Labs Lab 02/22/16 1209 02/23/16 0645 02/24/16 0449  NA 143 142 144  K 3.4* 2.9* 4.3  CL 98 103 112*  CO2 25 28 26   GLUCOSE 107* 131* 99  BUN 20 20 13   CREATININE 0.79 0.86 0.68  CALCIUM 9.9 9.5 8.6*   Liver Function Tests:  Recent Labs Lab 02/22/16 1209 02/23/16 0645 02/24/16 0449  AST 14 18 15   ALT 6 11* 9*  ALKPHOS 63 56 47  BILITOT 0.5 0.6 0.6  PROT 7.9 8.1 6.7  ALBUMIN 4.4 4.2 3.5    Recent Labs Lab 02/23/16 0645  LIPASE 41   CBC:  Recent Labs Lab 02/22/16 1223 02/23/16 0645 02/24/16 0449 02/25/16 0437  WBC 7.0 7.3 5.2 5.1  HGB 14.4 13.9 11.7* 11.7*  HCT 41.1 41.0 36.2 35.6*  MCV 83.3 84.7 85.6 85.6  PLT  --  130* 126* 143*   D-Dimer  Recent Labs  02/23/16 0903  DDIMER >20.00*   Thyroid function studies  Recent Labs  02/22/16 1209  TSH 1.180   Anemia work up  Recent Labs  02/22/16 1209  VITAMINB12 626   Urinalysis    Component Value Date/Time   COLORURINE AMBER (A) 02/23/2016 0631   APPEARANCEUR CLOUDY (A) 02/23/2016 0631   LABSPEC 1.030 02/23/2016  0631   PHURINE 5.0 02/23/2016 0631   GLUCOSEU NEGATIVE 02/23/2016 0631   HGBUR SMALL (A) 02/23/2016 0631   BILIRUBINUR NEGATIVE 02/23/2016 0631   BILIRUBINUR moderate (A) 02/22/2016 1241   KETONESUR 20 (A) 02/23/2016 0631   PROTEINUR 30 (A) 02/23/2016 0631   UROBILINOGEN 1.0 02/22/2016 1241   NITRITE NEGATIVE 02/23/2016 0631   LEUKOCYTESUR LARGE (A) 02/23/2016 0631    Microbiology Recent Results (from the past 240 hour(s))  Urine culture     Status: None   Collection Time: 02/22/16 12:09 PM  Result Value Ref Range Status   Urine Culture, Routine Final report  Final   Urine Culture result 1 Comment  Final    Comment: Culture shows less than 10,000 colony forming units of bacteria per milliliter of urine. This colony  count is not generally considered to be clinically significant.     Time coordinating discharge: Approximately 40 minutes  Vance Gather, MD  [facility] 02/25/2016, 11:21 AM Pager [phone]  If 7PM-7AM, please contact night-coverage [email or web address] Password [email or web address]

## 2016-02-25 NOTE — Telephone Encounter (Signed)
The patient has been admitted to the hospital, apparently she has a urinary tract infection, multiple pulmonary emboli, mass in the ovary.

## 2016-02-25 NOTE — Progress Notes (Signed)
Patient discharged home with husband, discharge instructions/prescrition given and explained to patient/husband and he verbalized understanding. No distress noted, skin intact, no wound noted. Accompanied home by husband, transported to the car by staff.

## 2016-02-26 LAB — URINE CULTURE

## 2016-02-27 NOTE — Progress Notes (Signed)
Subjective:    Patient ID: Tammy [name], female    DOB: 08/14/1946, 71 y.o.   MRN: [id number] Chief Complaint  Patient presents with  . Follow-up    hospital follow up/chest pain     HPI  [name] - "Tammy [name]" - is a 71 yo woman who is here in follow-up from hospitalization 2/10-2/12 for Multiple bilateral PE she suffered on a trip to [facility] on 2/5..  I met pt once over a yr prior.  Patient was hospitalized from February 10-12 and diagnosed on a right femoral and peroneal DVT and a pulmonary embolism which showed multiple segmental branch emboli in all lobes. No sign of heart strain on echo. Patient was prescribed Xarelto starter pack  which she will need to continue for 3-6 months.  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 Jannifer Franklin and she has been worsening - decreased po to just 2-3 spoonfuls and less argumentative. She has lost weight - will drink some ensure and a little water. Her sleep is disregulated - up overnight. SHe states she wakes up scared  She does not take her Aricept.  Her husband is her care giver.  Husband states she is still not eating. She is telling her husband that she is uncomfortable in her abdomen. Not having daily BM. Used a laxative generic tab and so had a BM today and yesterday.  Is really unaware of bowel regimen. She did have a CT scan of her abdomen and pelvis 5 days prior which showed multiple right hepatic cysts and noted that her bowels were normal with some diverticulosis. Imaging incidentally found an right lower pelvic 5 cm mass of which 56m appeared to possibly be ovarian tissue so most suggestive of teratoma versus dermoid. Unfortunately in light of patient's 70 pound weight loss in the past 70 months this is concerning. Oncology advised that this seemed unlikely to be a malignancy triggering the thrombosis and did not think that urgent intervention was necessary though did recommend patient see gynecology as an  outpatient.  [facility] 02/28/2016 02/28/2016 12/19/2014  Decreased Interest 0 0 0  Down, Depressed, Hopeless 0 0 0  PHQ - 2 Score 0 0 0   Past Medical History:  Diagnosis Date  . Alzheimer's disease 12/28/2014  . Headache disorder 12/28/2014  . Memory disturbance 12/28/2014   History reviewed. No pertinent surgical history. No current outpatient prescriptions on file prior to visit.   No current facility-administered medications on file prior to visit.    Allergies  Allergen Reactions  . Sulfa Antibiotics Palpitations   Family History  Problem Relation Age of Onset  . Heart disease Brother   . Heart attack Mother   . Dementia Mother    Social History   Social History  . Marital status: Married    Spouse name: WGwyndolyn [name] . Number of children: 3  . Years of education: 10   Occupational History  . retired    Social History Main Topics  . Smoking status: Never Smoker  . Smokeless tobacco: Never Used  . Alcohol use No  . Drug use: No  . Sexual activity: Yes   Other Topics Concern  . None   Social History Narrative   Married with 3 children   Right handed   8th grade   1 cup coffee daily    Review of Systems See hpi    Objective:   Physical Exam  Constitutional: She appears well-developed and well-nourished.  No distress.  HENT:  Head: Normocephalic and atraumatic.  Right Ear: External ear normal.  Left Ear: External ear normal.  Eyes: Conjunctivae are normal. No scleral icterus.  Neck: Normal range of motion. Neck supple. No thyromegaly present.  Cardiovascular: Normal rate, regular rhythm, normal heart sounds and intact distal pulses.   Pulmonary/Chest: Effort normal and breath sounds normal. No respiratory distress.  Musculoskeletal: She exhibits no edema.  Lymphadenopathy:    She has no cervical adenopathy.  Neurological: She is alert. She is disoriented.  Skin: Skin is warm and dry. She is not diaphoretic. No erythema.  Psychiatric:  She has a normal mood and affect. Her behavior is normal.   Loosely oriented to self only  BP 130/90   Pulse 98   Temp 98.1 F (36.7 C) (Oral)   Resp 16   Ht '5\' 8"'  (1.727 m)   Wt 159 lb (72.1 kg)   SpO2 100%   BMI 24.18 kg/m      Assessment & Plan:  Cannot do a "clean catch" urine due to dementia Will need nex rx for xarelto by March 22, 2016 - cont for several mos Mild thrombocytopenia - could be due to heparin pts 126-143 with prior of 312 and mild anemia with hgb 11.7 (prior 12-14).  Recheck in 1-2 mos. 1. Generalized abdominal pain - concern for ovarian mass as etiology of sxs. Refer to gyn. Ensure constipation not complicating by starting docusate daily. Use apap prn pain.  2. Pelvic mass   3. Loss of weight   4. Anorexia - start mirtazapine 20m qhs. Recheck in 2 mos  5. Early onset Alzheimer's disease with behavioral disturbance - moderate/severely progressed limiting history and compliance    Orders Placed This Encounter  Procedures  . Ambulatory referral to Gynecology    Referral Priority:   Routine    Referral Type:   Consultation    Referral Reason:   Specialty Services Required    Requested Specialty:   Gynecology    Number of Visits Requested:   1    Meds ordered this encounter  Medications  . docusate sodium (COLACE) 100 MG capsule    Sig: Take 1 capsule (100 mg total) by mouth daily.    Dispense:  30 capsule    Refill:  11  . mirtazapine (REMERON) 15 MG tablet    Sig: Take 1 tablet (15 mg total) by mouth at bedtime.    Dispense:  30 tablet    Refill:  2  . rivaroxaban (XARELTO) 20 MG TABS tablet    Sig: Take 1 tablet (20 mg total) by mouth daily with supper.    Dispense:  30 tablet    Refill:  1     EDelman Cheadle M.D.  [facility]  [phone] ([phone]  03/26/16 9:33 PM

## 2016-02-28 ENCOUNTER — Encounter: Payer: Self-pay | Admitting: Family Medicine

## 2016-02-28 ENCOUNTER — Ambulatory Visit (INDEPENDENT_AMBULATORY_CARE_PROVIDER_SITE_OTHER): Payer: Medicare Other | Admitting: Family Medicine

## 2016-02-28 VITALS — BP 130/90 | HR 98 | Temp 98.1°F | Resp 16 | Ht 68.0 in | Wt 159.0 lb

## 2016-02-28 DIAGNOSIS — G300 Alzheimer's disease with early onset: Secondary | ICD-10-CM | POA: Diagnosis not present

## 2016-02-28 DIAGNOSIS — F0281 Dementia in other diseases classified elsewhere with behavioral disturbance: Secondary | ICD-10-CM

## 2016-02-28 DIAGNOSIS — R1900 Intra-abdominal and pelvic swelling, mass and lump, unspecified site: Secondary | ICD-10-CM | POA: Diagnosis not present

## 2016-02-28 DIAGNOSIS — R634 Abnormal weight loss: Secondary | ICD-10-CM | POA: Diagnosis not present

## 2016-02-28 DIAGNOSIS — R630 Anorexia: Secondary | ICD-10-CM | POA: Diagnosis not present

## 2016-02-28 DIAGNOSIS — R1084 Generalized abdominal pain: Secondary | ICD-10-CM | POA: Diagnosis not present

## 2016-02-28 DIAGNOSIS — F02818 Dementia in other diseases classified elsewhere, unspecified severity, with other behavioral disturbance: Secondary | ICD-10-CM

## 2016-02-28 MED ORDER — DOCUSATE SODIUM 100 MG PO CAPS: 100.0000 mg | ORAL_CAPSULE | Freq: Every day | ORAL | 11 refills | Status: DC

## 2016-02-28 MED ORDER — MIRTAZAPINE 15 MG PO TABS: 15.0000 mg | ORAL_TABLET | Freq: Every day | ORAL | 2 refills | Status: DC

## 2016-02-28 MED ORDER — RIVAROXABAN 20 MG PO TABS: 20.0000 mg | ORAL_TABLET | Freq: Every day | ORAL | 1 refills | Status: DC

## 2016-02-28 NOTE — Patient Instructions (Addendum)
Start the docusate 100 mg and the mirtazapine 15 mg every night with dinner or before bed. Recheck with me in 2 months. You will be called from the gynecology office likely [facility] to have an appointment with the gynecologic surgeon and an ultrasound to better define this mass. The biggest concern is if it is causing discomfort and contributing to the decreased appetite and weight loss. We will want you to continue on the Xarelto for several months. If you are having any pain you may take Tylenol but do not use any other otc pain medication other than tylenol/acetaminophen - so no aleve, ibuprofen, motrin, advil, etc.   IF you received an x-ray today, you will receive an invoice from [facility]. Please contact [facility] at [phone] with questions or concerns regarding your invoice.   IF you received labwork today, you will receive an invoice from [place] [facility]. Please contact [facility] at [phone] with questions or concerns regarding your invoice.   Our billing staff will not be able to assist you with questions regarding bills from these companies.  You will be contacted with the lab results as soon as they are available. The fastest way to get your results is to activate your My Chart account. Instructions are located on the last page of this paperwork. If you have not heard from [place] regarding the results in 2 weeks, please contact this office.     High-Protein and High-Calorie Diet Introduction Eating high-protein and high-calorie foods can help you to gain weight, heal after an injury, and recover after an illness or surgery. What is my plan? The specific amount of daily protein and calories you need depends on:  Your body weight.  The reason this diet is recommended for you. Generally, a high-protein, high-calorie diet involves:  Eating 250-500 extra calories each day.  Making sure that 10-35% of your daily calories come from  protein. Talk to your health care provider about how much protein and how many calories you need each day. Follow the diet as directed by your health care provider. What do I need to know about this diet?  Ask your health care provider if you should take a nutritional supplement.  Try to eat six small meals each day instead of three large meals.  Eat a balanced diet, including one food that is high in protein at each meal.  Keep nutritious snacks handy, such as nuts, trail mixes, dried fruit, and yogurt.  If you have kidney disease or diabetes, eating too much protein may put extra stress on your kidneys. Talk to your health care provider if you have either of those conditions. What are some high-protein foods? Grains  Quinoa. Bulgur wheat. Vegetables  Soybeans. Peas. Meats and Other Protein Sources  Beef, pork, and poultry. Fish and seafood. Eggs. Tofu. Textured vegetable protein (TVP). Peanut butter. Nuts and seeds. Dried beans. Protein powders. Dairy  Whole milk. Whole-milk yogurt. Powdered milk. Cheese. Yahoo. Eggnog. Beverages  High-protein supplement drinks. Soy milk. Other  Protein bars. The items listed above may not be a complete list of recommended foods or beverages. Contact your dietitian for more options.  What are some high-calorie foods? Grains  Pasta. Quick breads. Muffins. Pancakes. Ready-to-eat cereal. Vegetables  Vegetables cooked in oil or butter. Fried potatoes. Fruits  Dried fruit. Fruit leather. Canned fruit in syrup. Fruit juice. Avocados. Meats and Other Protein Sources  Peanut butter. Nuts and seeds. Dairy  Heavy cream. Whipped cream. Cream cheese. Sour cream. Ice cream.  Custard. Pudding. Beverages  Meal-replacement beverages. Nutrition shakes. Fruit juice. Sugar-sweetened soft drinks. Condiments  Salad dressing. Mayonnaise. Alfredo sauce. Fruit preserves or jelly. Honey. Syrup. Sweets/Desserts  Cake. Cookies. Pie. Pastries. Candy bars.  Chocolate. Fats and Oils  Butter or margarine. Oil. Gravy. Other  Meal-replacement bars. The items listed above may not be a complete list of recommended foods or beverages. Contact your dietitian for more options.  What are some tips for including high-protein and high-calorie foods in my diet?  Add whole milk, half-and-half, or heavy cream to cereal, pudding, soup, or hot cocoa.  Add whole milk to instant breakfast drinks.  Add peanut butter to oatmeal or smoothies.  Add powdered milk to baked goods, smoothies, or milkshakes.  Add powdered milk, cream, or butter to mashed potatoes.  Add cheese to cooked vegetables.  Make whole-milk yogurt parfaits. Top them with granola, fruit, or nuts.  Add cottage cheese to your fruit.  Add avocados, cheese, or both to sandwiches or salads.  Add meat, poultry, or seafood to rice, pasta, casseroles, salads, and soups.  Use mayonnaise when making egg salad, chicken salad, or tuna salad.  Use peanut butter as a topping for pretzels, celery, or crackers.  Add beans to casseroles, dips, and spreads.  Add pureed beans to sauces and soups.  Replace calorie-free drinks with calorie-containing drinks, such as milk and fruit juice. This information is not intended to replace advice given to you by your health care provider. Make sure you discuss any questions you have with your health care provider. Document Released: 12/30/2004 Document Revised: 06/07/2015 Document Reviewed: 06/14/2013  2017 Elsevier

## 2016-03-11 ENCOUNTER — Other Ambulatory Visit: Payer: Self-pay | Admitting: Gynecology

## 2016-03-11 ENCOUNTER — Ambulatory Visit (INDEPENDENT_AMBULATORY_CARE_PROVIDER_SITE_OTHER): Payer: Medicare Other | Admitting: Gynecology

## 2016-03-11 ENCOUNTER — Ambulatory Visit (INDEPENDENT_AMBULATORY_CARE_PROVIDER_SITE_OTHER): Payer: Medicare Other

## 2016-03-11 ENCOUNTER — Encounter: Payer: Self-pay | Admitting: Gynecology

## 2016-03-11 VITALS — BP 118/80 | Ht 68.0 in | Wt 158.0 lb

## 2016-03-11 DIAGNOSIS — Z124 Encounter for screening for malignant neoplasm of cervix: Secondary | ICD-10-CM

## 2016-03-11 DIAGNOSIS — N838 Other noninflammatory disorders of ovary, fallopian tube and broad ligament: Secondary | ICD-10-CM

## 2016-03-11 DIAGNOSIS — N839 Noninflammatory disorder of ovary, fallopian tube and broad ligament, unspecified: Secondary | ICD-10-CM | POA: Diagnosis not present

## 2016-03-11 DIAGNOSIS — R1900 Intra-abdominal and pelvic swelling, mass and lump, unspecified site: Secondary | ICD-10-CM | POA: Diagnosis not present

## 2016-03-11 NOTE — Progress Notes (Signed)
   Patient is a 71 year old gravida 3 para 3 (disease normal spontaneous vaginal delivery) who was referred 56 practice as a courtesy of her PCP Dr. Delman Cheadle is a result of incidental finding on recent CT scan that had been ordered on this patient as a result of abdominal discomfort, constipation and poor eating habit. An incidental finding of a neck neck mass was reported.  Patient was hospitalized from February 10-12 and diagnosed on a right femoral and peroneal DVT and a pulmonary embolism which showed multiple segmental branch emboli in all lobes. No sign of heart strain on echo. Patient currently on as Xarelto. Patient has also history of early onset Alzheimer's disease followed by neurology Dr. Jannifer Franklin.   Her recent CT scan demonstrated the following: Reproductive: Normal uterus. Bilobed fat attenuating right lower pelvic mass measuring approximately 4.8 x 4.8 cm with the smaller fat nodule demonstrating peripheral calcification. Larger fatty mass has a 18 mm soft tissue mural nodule or ovarian tissue. This may reflect a teratoma or dermoid.  Other: No abdominal wall hernia or abnormality. No abdominopelvic ascites.  Musculoskeletal: No acute osseous abnormality. No lytic or sclerotic osseous lesion.  IMPRESSION: 1. No acute abdominal or pelvic pathology. 2. Fat attenuating right lower pelvic mass likely arising from the ovary. 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  Patient denies any vaginal bleeding and has never been on hormone replacement therapy. No prior abdominal pelvic surgery reported by her daughter who was present. Her last colonoscopy was 5 years ago as was her last Pap smear and mammogram.  Exam: Abdomen: Soft nontender no rebound or guarding Extremities: No inguinal lymphadenopathy Pelvic: Bartholin urethra Skene glands with atrophic changes Vagina: No gross lesions on inspection atrophic friable near the cervix Cervix: No gross lesion atrophic  changes Bimanual exam: Fullness right adnexa Uterus upper limits of normal anteverted Rectal exam not done  Ultrasound: Uterus measured 5.2 x 3.9 x 2.6 cm with endometrial stripe of 1.6 mm. A right ovarian mass measuring 7.0 x 5.9 x 4.0 cm solid cystic with diffuse homogeneous low level echoes with a lobulated echogenic area seen within the mass was noted. Arterial blood flow was seen to the wall of the mass. Left ovary was atrophic. No apparent left adnexal masses and no fluid in the cul-de-sac.  Assessment/plan: 71 year old with right suspicious adnexal mass on ultrasound today as described above as part of the evaluation from incidental finding on recent CT of the pelvis and abdomen as noted above. Pap smear was done today since it had been more than 5 years and she had a Pap smear. I'm going to refer her to the GYN oncologist Dr. Denman George for further evaluation and management of this highly suspicious mass for malignancy. A ROMA-1 ovarian cancer blood tests will be drawn today as well.

## 2016-03-11 NOTE — Patient Instructions (Signed)
Ovarian Cyst  An ovarian cyst is a fluid-filled sac that forms on an ovary. The ovaries are small organs that produce eggs in women. Various types of cysts can form on the ovaries. Some may cause symptoms and require treatment. Most ovarian cysts go away on their own, are not cancerous (are benign), and do not cause problems. Common types of ovarian cysts include:  Functional (follicle) cysts.  Occur during the menstrual cycle, and usually go away with the next menstrual cycle if you do not get pregnant.  Usually cause no symptoms.  Endometriomas.  Are cysts that form from the tissue that lines the uterus (endometrium).  Are sometimes called "chocolate cysts" because they become filled with blood that turns brown.  Can cause pain in the lower abdomen during intercourse and during your period.  Cystadenoma cysts.  Develop from cells on the outside surface of the ovary.  Can get very large and cause lower abdomen pain and pain with intercourse.  Can cause severe pain if they twist or break open (rupture).  Dermoid cysts.  Are sometimes found in both ovaries.  May contain different kinds of body tissue, such as skin, teeth, hair, or cartilage.  Usually do not cause symptoms unless they get very big.  Theca lutein cysts.  Occur when too much of a certain hormone (human chorionic gonadotropin) is produced and overstimulates the ovaries to produce an egg.  Are most common after having procedures used to assist with the conception of a baby (in vitro fertilization). What are the causes? Ovarian cysts may be caused by:  Ovarian hyperstimulation syndrome. This is a condition that can develop from taking fertility medicines. It causes multiple large ovarian cysts to form.  Polycystic ovarian syndrome (PCOS). This is a common hormonal disorder that can cause ovarian cysts, as well as problems with your period or fertility. What increases the risk? The following factors may make you  more likely to develop ovarian cysts:  Being overweight or obese.  Taking fertility medicines.  Taking certain forms of hormonal birth control.  Smoking. What are the signs or symptoms? Many ovarian cysts do not cause symptoms. If symptoms are present, they may include:  Pelvic pain or pressure.  Pain in the lower abdomen.  Pain during sex.  Abdominal swelling.  Abnormal menstrual periods.  Increasing pain with menstrual periods. How is this diagnosed? These cysts are commonly found during a routine pelvic exam. You may have tests to find out more about the cyst, such as:  Ultrasound.  X-ray of the pelvis.  CT scan.  MRI.  Blood tests. How is this treated? Many ovarian cysts go away on their own without treatment. Your health care provider may want to check your cyst regularly for 2-3 months to see if it changes. If you are in menopause, it is especially important to have your cyst monitored closely because menopausal women have a higher rate of ovarian cancer. When treatment is needed, it may include:  Medicines to help relieve pain.  A procedure to drain the cyst (aspiration).  Surgery to remove the whole cyst.  Hormone treatment or birth control pills. These methods are sometimes used to help dissolve a cyst. Follow these instructions at home:  Take over-the-counter and prescription medicines only as told by your health care provider.  Do not drive or use heavy machinery while taking prescription pain medicine.  Get regular pelvic exams and Pap tests as often as told by your health care provider.  Return to your   normal activities as told by your health care provider. Ask your health care provider what activities are safe for you.  Do not use any products that contain nicotine or tobacco, such as cigarettes and e-cigarettes. If you need help quitting, ask your health care provider.  Keep all follow-up visits as told by your health care provider. This is  important. Contact a health care provider if:  Your periods are late, irregular, or painful, or they stop.  You have pelvic pain that does not go away.  You have pressure on your bladder or trouble emptying your bladder completely.  You have pain during sex.  You have any of the following in your abdomen:  A feeling of fullness.  Pressure.  Discomfort.  Pain that does not go away.  Swelling.  You feel generally ill.  You become constipated.  You lose your appetite.  You develop severe acne.  You start to have more body hair and facial hair.  You are gaining weight or losing weight without changing your exercise and eating habits.  You think you may be pregnant. Get help right away if:  You have abdominal pain that is severe or gets worse.  You cannot eat or drink without vomiting.  You suddenly develop a fever.  Your menstrual period is much heavier than usual. This information is not intended to replace advice given to you by your health care provider. Make sure you discuss any questions you have with your health care provider. Document Released: 12/30/2004 Document Revised: 07/20/2015 Document Reviewed: 06/03/2015 Elsevier Interactive Patient Education  2017 [employer]  

## 2016-03-11 NOTE — Addendum Note (Signed)
Addended by: Burnett Kanaris on: 03/11/2016 02:15 PM   Modules accepted: Orders

## 2016-03-12 ENCOUNTER — Telehealth: Payer: Self-pay | Admitting: *Deleted

## 2016-03-12 LAB — PAP IG (IMAGE GUIDED)

## 2016-03-12 NOTE — Telephone Encounter (Signed)
-----   Message from Terrance Mass, MD sent at 03/11/2016  1:49 PM EST ----- Please make an appointment for this patient with Dr. Denman George patient ,with mass. CT and U/S in Epic. ROMA-1 blood test drawn today

## 2016-03-12 NOTE — Telephone Encounter (Signed)
Left message at Cancer center for them to schedule pt and call me back with time and date.

## 2016-03-13 NOTE — Telephone Encounter (Signed)
[facility] called back and said they had cancellation and pt can come on 03/17/16 @ 8:45am pt husband informed

## 2016-03-13 NOTE — Telephone Encounter (Signed)
Pt scheduled to see Dr.Rossi 04/02/16 @ 11:15am at [facility] cancer center, spoke with pt husband [name] with time and date.

## 2016-03-15 LAB — OVARIAN MALIGNANCY RISK-ROMA
CA125: 14 U/mL (ref <35)
HE4: 58 pM (ref <151)
ROMA POSTMENOPAUSAL: 1.26 (ref <2.77)
ROMA Premenopausal: 1.02 (ref <1.31)

## 2016-03-17 ENCOUNTER — Ambulatory Visit: Payer: Medicare Other | Attending: Gynecologic Oncology | Admitting: Gynecologic Oncology

## 2016-03-17 ENCOUNTER — Encounter: Payer: Self-pay | Admitting: Gynecologic Oncology

## 2016-03-17 VITALS — BP 165/93 | HR 71 | Temp 97.9°F | Resp 18 | Ht 68.0 in | Wt 160.7 lb

## 2016-03-17 DIAGNOSIS — G300 Alzheimer's disease with early onset: Secondary | ICD-10-CM | POA: Insufficient documentation

## 2016-03-17 DIAGNOSIS — Z888 Allergy status to other drugs, medicaments and biological substances status: Secondary | ICD-10-CM | POA: Insufficient documentation

## 2016-03-17 DIAGNOSIS — Z8249 Family history of ischemic heart disease and other diseases of the circulatory system: Secondary | ICD-10-CM | POA: Insufficient documentation

## 2016-03-17 DIAGNOSIS — Z820 Family history of epilepsy and other diseases of the nervous system: Secondary | ICD-10-CM | POA: Insufficient documentation

## 2016-03-17 DIAGNOSIS — D270 Benign neoplasm of right ovary: Secondary | ICD-10-CM | POA: Diagnosis not present

## 2016-03-17 DIAGNOSIS — Z79899 Other long term (current) drug therapy: Secondary | ICD-10-CM | POA: Diagnosis not present

## 2016-03-17 DIAGNOSIS — N83201 Unspecified ovarian cyst, right side: Secondary | ICD-10-CM | POA: Diagnosis present

## 2016-03-17 DIAGNOSIS — N838 Other noninflammatory disorders of ovary, fallopian tube and broad ligament: Secondary | ICD-10-CM

## 2016-03-17 DIAGNOSIS — Z7901 Long term (current) use of anticoagulants: Secondary | ICD-10-CM | POA: Diagnosis not present

## 2016-03-17 DIAGNOSIS — F0280 Dementia in other diseases classified elsewhere without behavioral disturbance: Secondary | ICD-10-CM | POA: Insufficient documentation

## 2016-03-17 NOTE — Patient Instructions (Signed)
No follow up needed with Dr. Denman George. Follow up with your Primary care Physician as scheduled.

## 2016-03-17 NOTE — Progress Notes (Signed)
Consult Note: Gyn-Onc  Consult was requested by Tammy. Tammy [name] for the evaluation of Tammy [name] 71 y.o. female  CC:  Chief Complaint  Patient presents with  . ovarian mass    Assessment/Plan:  Tammy [name]  is a 71 y.o.  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  I do not believe that this mass is malignant, and it is asymptomatic. Therefore, given her underlying acute prothrombotic state, need for anticoagulation, and underlying progressive dementia, there is no indication for surgery at this time. I do not feel that repeat imaging is necessary unless symptoms in the pelvic develop.  If the mass becomes symptomatic in the future (>56months post PE), it may be reasonable to stop anticoagulation to perform surgery. However, in the absence of symptoms, the risks of surgery are not justified by benefits.  She was instructed to follow-up with Tammy [name], her PCP for ongoing care.   HPI: Tammy [name] is a 71 year old G3P3 who is seen in consultation at the request of Tammy [name] for a right ovarian cyst.  The patient has progressive, early onset Alzheimer's but was otherwise physically healthy until she developed symptoms of chest pains in early February, 2018. These became worse around 02/23/16 and she was admitted to [facility] where a PE and right pelvic and peroneal DVT were diagnosed. She reported symptoms of poor appetite and poor oral intake with 20lb weight loss over several months. Upon admission a CT of the abdomen and pelvis was obtained on 02/23/16. It revealed a normal uterus but a bilobed right lower pelvic mass measuring approximately 4.8cm There was a larger fatty mass with an 36mm soft tissue mural nodule. The mass appeared consistent with a dermoid.   A transvaginal US was performed and this revealed a uterus measuring 5.2x3.9x2.6cm with a 1.59mm endometrial stripe and a 7x5.9x4cm right ovarian mass (cystic and solid).  The  patient was started on Xarelto anticoagulation.   She was evaluated by Tammy [name] on 03/11/16 who performed a ROMA tumor marker assessment. The CA 125 tumor marker was normal at 14 and the ROMA score was also normal for post-menopausal range (1.26).  The patient denies pelvic pain or symptoms. Since discharge from hospital she is eating better and is gaining weight.  Current Meds:  Outpatient Encounter Prescriptions as of 03/17/2016  Medication Sig  . docusate sodium (COLACE) 100 MG capsule Take 1 capsule (100 mg total) by mouth daily.  . mirtazapine (REMERON) 15 MG tablet Take 1 tablet (15 mg total) by mouth at bedtime.  . rivaroxaban (XARELTO) 20 MG TABS tablet Take 1 tablet (20 mg total) by mouth daily with supper.  . [DISCONTINUED] donepezil (ARICEPT) 10 MG tablet Take 1 tablet (10 mg total) by mouth at bedtime. (Patient not taking: Reported on 03/11/2016)   No facility-administered encounter medications on file as of 03/17/2016.     Allergy:  Allergies  Allergen Reactions  . Sulfa Antibiotics Palpitations    Social Hx:   Social History   Social History  . Marital status: Married    Spouse name: Tammy [name]  . Number of children: 3  . Years of education: 10   Occupational History  . retired    Social History Main Topics  . Smoking status: Never Smoker  . Smokeless tobacco: Never Used  . Alcohol use No  . Drug use: No  . Sexual activity: Yes   Other Topics Concern  .  Not on file   Social History Narrative   Married with 3 children   Right handed   8th grade   1 cup coffee daily    Past Surgical Hx: History reviewed. No pertinent surgical history.  Past Medical Hx:  Past Medical History:  Diagnosis Date  . Alzheimer's disease 12/28/2014  . Headache disorder 12/28/2014  . Memory disturbance 12/28/2014    Past Gynecological History:  SVD x 3 No LMP recorded. Patient is postmenopausal.  Family Hx:  Family History  Problem Relation Age of Onset  . Heart  disease Brother   . Heart attack Mother   . Dementia Mother     Review of Systems:  Constitutional  Feels well,    ENT Normal appearing ears and nares bilaterally Skin/Breast  No rash, sores, jaundice, itching, dryness Cardiovascular  No chest pain, shortness of breath, or edema  Pulmonary  No cough or wheeze.  Gastro Intestinal  No nausea, vomitting, or diarrhoea. No bright red blood per rectum, no abdominal pain, change in bowel movement, or constipation.  Genito Urinary  No frequency, urgency, dysuria, no bleeding Musculo Skeletal  No myalgia, arthralgia, joint swelling or pain  Neurologic  No weakness, numbness, change in gait,  Psychology  + dementia  Vitals:  Blood pressure (!) 165/93, [name] 71, temperature 97.9 F (36.6 C), temperature source Oral, resp. rate 18, height 5\' 8"  (1.727 m), weight 160 lb 11.2 oz (72.9 kg), SpO2 100 %.  Physical Exam: WD in NAD Neck  Supple NROM, without any enlargements.  Lymph Node Survey No cervical supraclavicular or inguinal adenopathy Cardiovascular  [name] normal rate, regularity and rhythm. S1 and S2 normal.  Lungs  Clear to auscultation bilateraly, without wheezes/crackles/rhonchi. Good air movement.  Skin  No rash/lesions/breakdown  Psychiatry  Alert and oriented to person, place, and time  Abdomen  Normoactive bowel sounds, abdomen soft, non-tender and nonobese without evidence of hernia.  Back No CVA tenderness Genito Urinary  Vulva/vagina: Normal external female genitalia.   No lesions. No discharge or bleeding.  Bladder/urethra:  No lesions or masses, well supported bladder  Vagina: normal  Cervix: Normal appearing, no lesions.  Uterus:  Small, mobile, no parametrial involvement or nodularity.  Adnexa: unable to appreciate masses. Rectal  Good tone, no masses no cul de sac nodularity. Large burden of stool in rectum Extremities  No bilateral cyanosis, clubbing or edema.   Donaciano Eva, MD   03/17/2016, 9:21 AM

## 2016-03-26 NOTE — Progress Notes (Signed)
Subjective:    Patient ID: Tammy [name], female    DOB: 01-Jun-1946, 71 y.o.   MRN: [id number] Chief Complaint  Patient presents with  . Follow-up    6 wks    HPI  "Tammy [name]" is a 71 yo woman who is here in a 70 mo follow-up visit.  She is here with her husband who is her caregiver.  Pt was hospitalized 2/10-2/12 for multiple bilateral PEs in all lobes that she suffered on a trip to [facility] on 2/5 from a right femoral and peroneal DVT. No right heart strain on Echo. Started on xarelto.  Rt ovarian mass seen on CT - seen by gyn Dr. Toney Rakes and gyn onc Dr. Denman George and suspected to be a benign dermoid cyst (teratoma). No suspicion for malignancy and pt is asymptomatic so rec no further eval unless pelvic sxs develop > 6 mos s/p PE.  Progressive, early onset Alzheimer's followed by neurology Dr. Jannifer Franklin. Worsening sig. Getting up overnight. She refused to take her Aricept. Loosely oriented to self only. Can be argumentative. Nml vit B12, tsh, UClx 02/2016.  Anorexia with unintentional weight loss (20 lbs over several mos). Was willing to eat/drink very little at a time - just 2-3 spoonfuls or some sips of ensure. Periodically c/o abd discomfort. BMs were irregular and sporadic. We started pt on xarelto 15mg  qhs 1 mo prior.  Stopped the mirtazipine - caused her to leave th hours. Hgba1c 5.7 > 1 yr prior  Past Medical History:  Diagnosis Date  . Alzheimer's disease 12/28/2014  . Headache disorder 12/28/2014  . Memory disturbance 12/28/2014   No past surgical history on file. Current Outpatient Prescriptions on File Prior to Visit  Medication Sig Dispense Refill  . docusate sodium (COLACE) 100 MG capsule Take 1 capsule (100 mg total) by mouth daily. (Patient not taking: Reported on 03/27/2016) 30 capsule 11   No current facility-administered medications on file prior to visit.    Allergies  Allergen Reactions  . Sulfa Antibiotics Palpitations   Family History  Problem Relation Age of Onset  .  Heart disease Brother   . Heart attack Mother   . Dementia Mother    Social History   Social History  . Marital status: Married    Spouse name: [name]  . Number of children: 3  . Years of education: 10   Occupational History  . retired    Social History Main Topics  . Smoking status: Never Smoker  . Smokeless tobacco: Never Used  . Alcohol use No  . Drug use: No  . Sexual activity: Yes   Other Topics Concern  . None   Social History Narrative   Married with 3 children   Right handed   8th grade   1 cup coffee daily   Depression screen [facility] 2/9 03/27/2016 02/28/2016 02/28/2016 12/19/2014  Decreased Interest 0 0 0 0  Down, Depressed, Hopeless 0 0 0 0  PHQ - 2 Score 0 0 0 0    Review of Systems See hpi    Objective:   Physical Exam  Constitutional: She appears well-developed and well-nourished. No distress.  HENT:  Head: Normocephalic and atraumatic.  Right Ear: External ear normal.  Left Ear: External ear normal.  Eyes: Conjunctivae are normal. No scleral icterus.  Neck: Normal range of motion. Neck supple. No thyromegaly present.  Cardiovascular: Normal rate, regular rhythm, normal heart sounds and intact distal pulses.   Pulmonary/Chest: Effort normal and breath sounds normal. No respiratory  distress.  Abdominal: Normal appearance and bowel sounds are normal. She exhibits no distension and no mass. There is no hepatosplenomegaly. There is tenderness in the left upper quadrant. There is guarding. There is no rigidity, no rebound and no CVA tenderness.  Musculoskeletal: She exhibits no edema.  Lymphadenopathy:    She has no cervical adenopathy.  Neurological: She is alert. She is disoriented.  Skin: Skin is warm and dry. She is not diaphoretic. No erythema.  Psychiatric: Her speech is normal and behavior is normal. Her affect is inappropriate. Cognition and memory are impaired. She expresses impulsivity and inappropriate judgment. She exhibits abnormal recent memory  and abnormal remote memory.  argumentative  BP (!) 130/95 (BP Location: Left Arm, Cuff Size: Normal)   Pulse 75   Temp 98.2 F (36.8 C) (Oral)   Resp 16   Ht 5\' 8"  (1.727 m)   Wt 164 lb (74.4 kg)   SpO2 100%   BMI 24.94 kg/m      Assessment & Plan:  Cont xarelto through 08/22/16 - will need new rx  1. Other acute pulmonary embolism without acute cor pulmonale (HCC)   2. Early onset Alzheimer's disease with behavioral disturbance   3. Prediabetes   4. Weight loss, unintentional   5. Generalized abdominal pain   6. Anorexia   7. Cognitive decline   8. Thrombocytopenia ([place])     Orders Placed This Encounter  Procedures  . CBC with Differential/Platelet  . Comprehensive metabolic panel  . Hemoglobin A1c  . Hemoglobin A1c  . Care order/instruction:    AVS printed - let patient go!    Meds ordered this encounter  Medications  . rivaroxaban (XARELTO) 20 MG TABS tablet    Sig: Take 1 tablet (20 mg total) by mouth daily with supper.    Dispense:  30 tablet    Refill:  4    Delman Cheadle, M.D.  Primary Care at [facility] [address] [place], [place] [address] [phone] phone [phone] fax  05/11/16 7:55 PM

## 2016-03-27 ENCOUNTER — Encounter: Payer: Self-pay | Admitting: Family Medicine

## 2016-03-27 ENCOUNTER — Ambulatory Visit (INDEPENDENT_AMBULATORY_CARE_PROVIDER_SITE_OTHER): Payer: Medicare Other | Admitting: Family Medicine

## 2016-03-27 VITALS — BP 130/95 | HR 75 | Temp 98.2°F | Resp 16 | Ht 68.0 in | Wt 164.0 lb

## 2016-03-27 DIAGNOSIS — F0281 Dementia in other diseases classified elsewhere with behavioral disturbance: Secondary | ICD-10-CM | POA: Diagnosis not present

## 2016-03-27 DIAGNOSIS — R7303 Prediabetes: Secondary | ICD-10-CM

## 2016-03-27 DIAGNOSIS — R1084 Generalized abdominal pain: Secondary | ICD-10-CM | POA: Diagnosis not present

## 2016-03-27 DIAGNOSIS — F02818 Dementia in other diseases classified elsewhere, unspecified severity, with other behavioral disturbance: Secondary | ICD-10-CM

## 2016-03-27 DIAGNOSIS — G300 Alzheimer's disease with early onset: Secondary | ICD-10-CM

## 2016-03-27 DIAGNOSIS — D696 Thrombocytopenia, unspecified: Secondary | ICD-10-CM

## 2016-03-27 DIAGNOSIS — R4189 Other symptoms and signs involving cognitive functions and awareness: Secondary | ICD-10-CM | POA: Diagnosis not present

## 2016-03-27 DIAGNOSIS — R634 Abnormal weight loss: Secondary | ICD-10-CM

## 2016-03-27 DIAGNOSIS — I2699 Other pulmonary embolism without acute cor pulmonale: Secondary | ICD-10-CM | POA: Diagnosis not present

## 2016-03-27 DIAGNOSIS — R630 Anorexia: Secondary | ICD-10-CM

## 2016-03-27 MED ORDER — RIVAROXABAN 20 MG PO TABS: 20.0000 mg | ORAL_TABLET | Freq: Every day | ORAL | 4 refills | Status: DC

## 2016-03-27 NOTE — Patient Instructions (Addendum)
We recommend that you schedule a mammogram for breast cancer screening. Typically, you do not need a referral to do this. Please contact a local imaging center to schedule your mammogram.  [facility] - [phone]  *ask for the Radiology Department [facility] ([place]) - [phone] or [phone]  [facility] - [phone] [place] [phone] [facility] - [phone]  *ask for the [facility] - [phone]  *ask for the Radiology Department [facility] - [phone]  *ask for the [place] - [phone]     IF you received an x-ray today, you will receive an invoice from [facility]. Please contact [facility] Radiology at [phone] with questions or concerns regarding your invoice.   IF you received labwork today, you will receive an invoice from [facility]. Please contact [facility] at [phone] with questions or concerns regarding your invoice.   Our billing staff will not be able to assist you with questions regarding bills from these companies.  You will be contacted with the lab results as soon as they are available. The fastest way to get your results is to activate your My Chart account. Instructions are located on the last page of this paperwork. If you have not heard from [place] regarding the results in 2 weeks, please contact this office.      How to Take Your Blood Pressure Blood pressure is a measurement of how strongly your blood is pressing against the walls of your arteries. Arteries are blood vessels that carry blood from your heart throughout your body. Your health care provider takes your blood pressure at each office visit. You can also take your own blood pressure at home with a blood pressure machine. You may need to take your own blood pressure:  To confirm a diagnosis of high blood pressure  (hypertension).  To monitor your blood pressure over time.  To make sure your blood pressure medicine is working. Supplies needed: To take your blood pressure, you will need a blood pressure machine. You can buy a blood pressure machine, or blood pressure monitor, at most drugstores or online. There are several types of home blood pressure monitors. When choosing one, consider the following:  Choose a monitor that has an arm cuff.  Choose a monitor that wraps snugly around your upper arm. You should be able to fit only one finger between your arm and the cuff.  Do not choose a monitor that measures your blood pressure from your wrist or finger. Your health care provider can suggest a reliable monitor that will meet your needs. How to prepare To get the most accurate reading, avoid the following for 30 minutes before you check your blood pressure:  Drinking caffeine.  Drinking alcohol.  Eating.  Smoking.  Exercising. Five minutes before you check your blood pressure:  Empty your bladder.  Sit quietly without talking in a dining chair, rather than in a soft couch or armchair. How to take your blood pressure To check your blood pressure, follow the instructions in the manual that came with your blood pressure monitor. If you have a digital blood pressure monitor, the instructions may be as follows: 1. Sit up straight. 2. Place your feet on the floor. Do not cross your ankles or legs. 3. Rest your left arm at the level of your heart on a table or desk or on the arm of a chair. 4. Pull  up your shirt sleeve. 5. Wrap the blood pressure cuff around the upper part of your left arm, 1 inch (2.5 cm) above your elbow. It is best to wrap the cuff around bare skin. 6. Fit the cuff snugly around your arm. You should be able to place only one finger between the cuff and your arm. 7. Position the cord inside the groove of your elbow. 8. Press the power button. 9. Sit quietly while the cuff  inflates and deflates. 10. Read the digital reading on the monitor screen and write it down (record it). 11. Wait 2-3 minutes, then repeat the steps, starting at step 1. What does my blood pressure reading mean? A blood pressure reading consists of a higher number over a lower number. Ideally, your blood pressure should be below 120/80. The first ("top") number is called the systolic pressure. It is a measure of the pressure in your arteries as your heart beats. The second ("bottom") number is called the diastolic pressure. It is a measure of the pressure in your arteries as the heart relaxes. Blood pressure is classified into four stages. The following are the stages for adults who do not have a short-term serious illness or a chronic condition. Systolic pressure and diastolic pressure are measured in a unit called mm Hg. Normal   Systolic pressure: below 144.  Diastolic pressure: below 80. Elevated   Systolic pressure: 315-400.  Diastolic pressure: below 80. Hypertension stage 1   Systolic pressure: 867-619.  Diastolic pressure: 50-93. Hypertension stage 2   Systolic pressure: 267 or above.  Diastolic pressure: 90 or above. You can have prehypertension or hypertension even if only the systolic or only the diastolic number in your reading is higher than normal. Follow these instructions at home:  Check your blood pressure as often as recommended by your health care provider.  Take your monitor to the next appointment with your health care provider to make sure:  That you are using it correctly.  That it provides accurate readings.  Be sure you understand what your goal blood pressure numbers are.  Tell your health care provider if you are having any side effects from blood pressure medicine. Contact a health care provider if:  Your blood pressure is consistently high. Get help right away if:  Your systolic blood pressure is higher than 180.  Your diastolic blood  pressure is higher than 110. This information is not intended to replace advice given to you by your health care provider. Make sure you discuss any questions you have with your health care provider. Document Released: 06/08/2015 Document Revised: 08/21/2015 Document Reviewed: 06/08/2015 Elsevier Interactive Patient Education  2017 [employer]. Blood Pressure Record Sheet Your blood pressure on this visit to the emergency department or clinic is elevated. This does not necessarily mean you have high blood pressure (hypertension), but it does mean that your blood pressure needs to be rechecked. Many times your blood pressure can increase due to illness, pain, anxiety, or other factors. We recommend that you get a series of blood pressure readings done over a period of 5 days. It is best to get a reading in the morning and one in the evening. You should make sure to sit and relax for 1-5 minutes before the reading is taken. Write the readings down and make a follow-up appointment with your health care provider to discuss the results. If there is not a free clinic or a drug store with a blood-pressure-taking machine near you, you can purchase  blood-pressure-taking equipment from a drug store. Having one in the home allows you the convenience of taking your blood pressure while you are home and relaxed. Blood Pressure Log Date: _______________________  a.m. _____________________  p.m. _____________________ Date: _______________________  a.m. _____________________  p.m. _____________________ Date: _______________________  a.m. _____________________  p.m. _____________________ Date: _______________________  a.m. _____________________  p.m. _____________________ Date: _______________________  a.m. _____________________  p.m. _____________________ This information is not intended to replace advice given to you by your health care provider. Make sure you discuss any questions you have with  your health care provider. Document Released: 09/28/2002 Document Revised: 12/14/2015 Document Reviewed: 02/22/2013 Elsevier Interactive Patient Education  2017 [employer].

## 2016-03-28 LAB — COMPREHENSIVE METABOLIC PANEL
ALK PHOS: 46 IU/L (ref 39–117)
ALT: 7 IU/L (ref 0–32)
AST: 11 IU/L (ref 0–40)
Albumin/Globulin Ratio: 1.2 (ref 1.2–2.2)
Albumin: 3.6 g/dL (ref 3.6–4.8)
BUN/Creatinine Ratio: 15 (ref 12–28)
BUN: 12 mg/dL (ref 8–27)
Bilirubin Total: 0.3 mg/dL (ref 0.0–1.2)
CHLORIDE: 108 mmol/L — AB (ref 96–106)
CO2: 25 mmol/L (ref 18–29)
Calcium: 9 mg/dL (ref 8.7–10.3)
Creatinine, Ser: 0.8 mg/dL (ref 0.57–1.00)
GFR calc Af Amer: 88 mL/min/{1.73_m2} (ref >59)
GFR calc non Af Amer: 76 mL/min/{1.73_m2} (ref >59)
GLUCOSE: 87 mg/dL (ref 65–99)
Globulin, Total: 3 g/dL (ref 1.5–4.5)
Potassium: 4 mmol/L (ref 3.5–5.2)
Sodium: 147 mmol/L — ABNORMAL HIGH (ref 134–144)
Total Protein: 6.6 g/dL (ref 6.0–8.5)

## 2016-03-28 LAB — CBC WITH DIFFERENTIAL/PLATELET
BASOS ABS: 0 10*3/uL (ref 0.0–0.2)
Basos: 1 %
EOS (ABSOLUTE): 0.1 10*3/uL (ref 0.0–0.4)
EOS: 1 %
Hematocrit: 33.5 % — ABNORMAL LOW (ref 34.0–46.6)
Hemoglobin: 10.6 g/dL — ABNORMAL LOW (ref 11.1–15.9)
IMMATURE GRANS (ABS): 0 10*3/uL (ref 0.0–0.1)
Immature Granulocytes: 0 %
LYMPHS: 30 %
Lymphocytes Absolute: 1.6 10*3/uL (ref 0.7–3.1)
MCH: 27.8 pg (ref 26.6–33.0)
MCHC: 31.6 g/dL (ref 31.5–35.7)
MCV: 88 fL (ref 79–97)
MONOCYTES: 7 %
Monocytes Absolute: 0.4 10*3/uL (ref 0.1–0.9)
NEUTROS ABS: 3.3 10*3/uL (ref 1.4–7.0)
Neutrophils: 61 %
PLATELETS: 321 10*3/uL (ref 150–379)
RBC: 3.81 x10E6/uL (ref 3.77–5.28)
RDW: 15.2 % (ref 12.3–15.4)
WBC: 5.3 10*3/uL (ref 3.4–10.8)

## 2016-03-28 LAB — HEMOGLOBIN A1C
Est. average glucose Bld gHb Est-mCnc: 114 mg/dL
HEMOGLOBIN A1C: 5.6 % (ref 4.8–5.6)

## 2016-04-02 ENCOUNTER — Ambulatory Visit: Payer: Medicare Other | Admitting: Gynecologic Oncology

## 2016-05-28 ENCOUNTER — Encounter: Payer: Self-pay | Admitting: Gynecology

## 2016-05-30 ENCOUNTER — Emergency Department (HOSPITAL_COMMUNITY)
Admission: EM | Admit: 2016-05-30 | Discharge: 2016-05-30 | Disposition: A | Discharge status: Home / Self Care | Payer: Medicare Other | Attending: Emergency Medicine | Admitting: Emergency Medicine

## 2016-05-30 ENCOUNTER — Encounter (HOSPITAL_COMMUNITY): Payer: Self-pay | Admitting: Nurse Practitioner

## 2016-05-30 ENCOUNTER — Emergency Department (HOSPITAL_COMMUNITY): Payer: Medicare Other

## 2016-05-30 DIAGNOSIS — Z7901 Long term (current) use of anticoagulants: Secondary | ICD-10-CM | POA: Diagnosis not present

## 2016-05-30 DIAGNOSIS — M25511 Pain in right shoulder: Secondary | ICD-10-CM | POA: Diagnosis present

## 2016-05-30 DIAGNOSIS — G309 Alzheimer's disease, unspecified: Secondary | ICD-10-CM | POA: Insufficient documentation

## 2016-05-30 NOTE — ED Notes (Addendum)
Pt c/o upper right arm pain onset today. Denies numbness and tingling. No medications taken today for pain. Pt's husband was worried about blood clots since she has a hx of the same. Pt is in early stages of alzheimer's so unable to give accurate number on pain scale.

## 2016-05-30 NOTE — Discharge Instructions (Signed)
You have been seen today for shoulder pain. There were no acute abnormalities on the x-rays, including no sign of fracture or dislocation. Pain: May take ibuprofen or naproxen to reduce pain and inflammation. Take these types of medications with food to avoid upset stomach. Choose one of these medications, but not both.  Ice: May apply ice to the area over the next 24 hours for 15 minutes at a time to reduce swelling. Sling: Wear the sling for support and comfort. Wear this until pain resolves. Exercises: Start by performing these exercises a few times a week, increasing the frequency until you are performing them twice daily. Make sure to perform these exercises and remove the arm from the sling daily to prevent a condition called frozen shoulder. Follow up: Follow-up with orthopedics on this matter. Call the number provided to set up an appointment.

## 2016-05-30 NOTE — ED Provider Notes (Signed)
[facility] DEPT Provider Note   CSN: [id number] Arrival date & time: 05/30/16  2007     History   Chief Complaint Chief Complaint  Patient presents with  . Arm Pain    HPI Tammy [name] is a 71 y.o. female.  HPI   Level V caveat due to dementia.  Tammy [name] is a 71 y.o. female, with a history of Alzheimer's, presenting to the ED with Right shoulder pain that she began to complain about today. Husband states that the patient has difficulty communicating due to her Alzheimer's. For this reason, whenever the patient complains of pain patient's husband brings her to be evaluated. Patient's husband inquires about a blood clot. Denies known trauma, fever/chills, chest pain, shortness of breath, cough, extremity swelling, neuro deficits, or any other complaints.   Past Medical History:  Diagnosis Date  . Alzheimer's disease 12/28/2014  . Headache disorder 12/28/2014  . Memory disturbance 12/28/2014    Patient Active Problem List   Diagnosis Date Noted  . Pelvic mass in female 03/11/2016  . 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 Ovarian mass 02/24/2016  . Pulmonary embolus ([place]) 02/23/2016  . Vitamin D deficiency 01/02/2015  . Prediabetes 01/02/2015  . Memory disturbance 12/28/2014  . Headache disorder 12/28/2014  . Alzheimer's disease 12/28/2014    History reviewed. No pertinent surgical history.  OB History    Gravida Para Term Preterm AB Living   3       0 3   SAB TAB Ectopic Multiple Live Births       0           Home Medications    Prior to Admission medications   Medication Sig Start Date End Date Taking? Authorizing Provider  docusate sodium (COLACE) 100 MG capsule Take 1 capsule (100 mg total) by mouth daily. Patient not taking: Reported on 03/27/2016 02/28/16   [name], MD  rivaroxaban (XARELTO) 20 MG TABS tablet Take 1 tablet (20 mg total) by mouth daily with supper. 03/27/16   [name], MD    Family History Family History  Problem  Relation Age of Onset  . Heart disease Brother   . Heart attack Mother   . Dementia Mother     Social History Social History  Substance Use Topics  . Smoking status: Never Smoker  . Smokeless tobacco: Never Used  . Alcohol use No     Allergies   Sulfa antibiotics   Review of Systems Review of Systems  Unable to perform ROS: Dementia  Musculoskeletal: Positive for arthralgias.  Neurological: Negative for weakness and numbness.     Physical Exam Updated Vital Signs BP (!) 156/91 (BP Location: Left Arm)   Pulse 71   Temp 98.9 F (37.2 C) (Oral)   Resp 18   Wt 171 lb 6.4 oz (77.7 kg)   SpO2 100%   BMI 26.06 kg/m   Physical Exam  Constitutional: She appears well-developed and well-nourished. No distress.  HENT:  Head: Normocephalic and atraumatic.  Eyes: Conjunctivae are normal.  Neck: Normal range of motion. Neck supple.  Cardiovascular: Normal rate, regular rhythm, normal heart sounds and intact distal pulses.   Pulmonary/Chest: Effort normal and breath sounds normal. No respiratory distress.  Abdominal: Soft. There is no tenderness. There is no guarding.  Musculoskeletal: She exhibits tenderness. She exhibits no edema or deformity.  Full range of motion in the right shoulder, elbow, and wrist. No noted swelling, erythema, increased warmth, crepitus, deformity, or  significant tenderness. Normal motor function intact in all other extremities and spine. No midline spinal tenderness.   Lymphadenopathy:    She has no cervical adenopathy.    She has no axillary adenopathy.  Neurological: She is alert.  No sensory deficits noted. Strength 5/5 in bilateral upper extremities.   Skin: Skin is warm and dry. Capillary refill takes less than 2 seconds. She is not diaphoretic.  Nursing note and vitals reviewed.    ED Treatments / Results  Labs (all labs ordered are listed, but only abnormal results are displayed) Labs Reviewed - No data to display  EKG  EKG  Interpretation None       Radiology Dg Shoulder Right  Result Date: 05/30/2016 CLINICAL DATA:  Right upper arm pain EXAM: RIGHT SHOULDER - 2+ VIEW COMPARISON:  None. FINDINGS: There is no evidence of fracture or dislocation. There is no evidence of arthropathy or other focal bone abnormality. Soft tissues are unremarkable. IMPRESSION: Negative. Electronically Signed   By: Donavan Foil M.D.   On: 05/30/2016 23:15    Procedures Procedures (including critical care time)  Medications Ordered in ED Medications - No data to display   Initial Impression / Assessment and Plan / ED Course  I have reviewed the triage vital signs and the nursing notes.  Pertinent labs & imaging results that were available during my care of the patient were reviewed by me and considered in my medical decision making (see chart for details).     Patient presents with right shoulder pain. Patient has no swelling, erythema, exquisite tenderness, or other similar abnormalities on exam. She is compliant with her Xarelto. Low suspicion for DVT and septic joint. No acute abnormalities on x-ray. Sling placed. Orthopedic follow-up. The patient and her husband were given instructions for home care as well as return precautions. Both parties voice understanding of these instructions, accept the plan, and are comfortable with discharge.   Findings and plan of care discussed with Antony Blackbird, MD. Dr. Sherry Ruffing personally evaluated and examined this patient.   Final Clinical Impressions(s) / ED Diagnoses   Final diagnoses:  Acute pain of right shoulder    New Prescriptions Discharge Medication List as of 05/30/2016 11:26 PM       Lorayne Bender, PA-C 05/31/16 Gravity, [place], PA-C 05/31/16 0058    Tegeler, Gwenyth Allegra, MD 06/09/16 [phone]

## 2016-05-30 NOTE — ED Triage Notes (Signed)
Pt is c/o right arm pain that started today.Pt has no other complaints.

## 2016-06-26 ENCOUNTER — Ambulatory Visit (INDEPENDENT_AMBULATORY_CARE_PROVIDER_SITE_OTHER): Payer: Medicare Other | Admitting: Adult Health

## 2016-06-26 ENCOUNTER — Encounter: Payer: Self-pay | Admitting: Adult Health

## 2016-06-26 VITALS — BP 141/97 | HR 70 | Wt 178.0 lb

## 2016-06-26 DIAGNOSIS — G300 Alzheimer's disease with early onset: Secondary | ICD-10-CM

## 2016-06-26 DIAGNOSIS — F0280 Dementia in other diseases classified elsewhere without behavioral disturbance: Secondary | ICD-10-CM | POA: Diagnosis not present

## 2016-06-26 MED ORDER — DONEPEZIL HCL 5 MG PO TABS: ORAL_TABLET | ORAL | 5 refills | Status: DC

## 2016-06-26 NOTE — Progress Notes (Signed)
I have read the note, and I agree with the clinical assessment and plan.  WILLIS,CHARLES KEITH   

## 2016-06-26 NOTE — Patient Instructions (Signed)
Memory score has declined Restart Aricept 5 mg daily for 2 weeks then increase to 10 mg daily thereafter.  Try taking in the morning- if not tolerated please let me know  Donepezil tablets What is this medicine? DONEPEZIL (doe NEP e zil) is used to treat mild to moderate dementia caused by Alzheimer's disease. This medicine may be used for other purposes; ask your health care provider or pharmacist if you have questions. COMMON BRAND NAME(S): Aricept What should I tell my health care provider before I take this medicine? They need to know if you have any of these conditions: -asthma or other lung disease -difficulty passing urine -head injury -heart disease -history of irregular heartbeat -liver disease -seizures (convulsions) -stomach or intestinal disease, ulcers or stomach bleeding -an unusual or allergic reaction to donepezil, other medicines, foods, dyes, or preservatives -pregnant or trying to get pregnant -breast-feeding How should I use this medicine? Take this medicine by mouth with a glass of water. Follow the directions on the prescription label. You may take this medicine with or without food. Take this medicine at regular intervals. This medicine is usually taken before bedtime. Do not take it more often than directed. Continue to take your medicine even if you feel better. Do not stop taking except on your doctor's advice. If you are taking the 23 mg donepezil tablet, swallow it whole; do not cut, crush, or chew it. Talk to your pediatrician regarding the use of this medicine in children. Special care may be needed. Overdosage: If you think you have taken too much of this medicine contact a poison control center or emergency room at once. NOTE: This medicine is only for you. Do not share this medicine with others. What if I miss a dose? If you miss a dose, take it as soon as you can. If it is almost time for your next dose, take only that dose, do not take double or extra  doses. What may interact with this medicine? Do not take this medicine with any of the following medications: -certain medicines for fungal infections like itraconazole, fluconazole, posaconazole, and voriconazole -cisapride -dextromethorphan; quinidine -dofetilide -dronedarone -pimozide -quinidine -thioridazine -ziprasidone This medicine may also interact with the following medications: -antihistamines for allergy, cough and cold -atropine -bethanechol -carbamazepine -certain medicines for bladder problems like oxybutynin, tolterodine -certain medicines for Parkinson's disease like benztropine, trihexyphenidyl -certain medicines for stomach problems like dicyclomine, hyoscyamine -certain medicines for travel sickness like scopolamine -dexamethasone -ipratropium -NSAIDs, medicines for pain and inflammation, like ibuprofen or naproxen -other medicines for Alzheimer's disease -other medicines that prolong the QT interval (cause an abnormal heart rhythm) -phenobarbital -phenytoin -rifampin, rifabutin or rifapentine This list may not describe all possible interactions. Give your health care provider a list of all the medicines, herbs, non-prescription drugs, or dietary supplements you use. Also tell them if you smoke, drink alcohol, or use illegal drugs. Some items may interact with your medicine. What should I watch for while using this medicine? Visit your doctor or health care professional for regular checks on your progress. Check with your doctor or health care professional if your symptoms do not get better or if they get worse. You may get drowsy or dizzy. Do not drive, use machinery, or do anything that needs mental alertness until you know how this drug affects you. What side effects may I notice from receiving this medicine? Side effects that you should report to your doctor or health care professional as soon as possible: -allergic reactions like skin  rash, itching or hives,  swelling of the face, lips, or tongue -feeling faint or lightheaded, falls -loss of bladder control -seizures -signs and symptoms of a dangerous change in heartbeat or heart rhythm like chest pain; dizziness; fast or irregular heartbeat; palpitations; feeling faint or lightheaded, falls; breathing problems -signs and symptoms of infection like fever or chills; cough; sore throat; pain or trouble passing urine -signs and symptoms of liver injury like dark yellow or brown urine; general ill feeling or flu-like symptoms; light-colored stools; loss of appetite; nausea; right upper belly pain; unusually weak or tired; yellowing of the eyes or skin -slow heartbeat or palpitations -unusual bleeding or bruising -vomiting Side effects that usually do not require medical attention (report to your doctor or health care professional if they continue or are bothersome): -diarrhea, especially when starting treatment -headache -loss of appetite -muscle cramps -nausea -stomach upset This list may not describe all possible side effects. Call your doctor for medical advice about side effects. You may report side effects to FDA at 1-800-FDA-1088. Where should I keep my medicine? Keep out of reach of children. Store at room temperature between 15 and 30 degrees C (59 and 86 degrees F). Throw away any unused medicine after the expiration date. NOTE: This sheet is a summary. It may not cover all possible information. If you have questions about this medicine, talk to your doctor, pharmacist, or health care provider.  2018 Elsevier/Gold Standard (2015-06-18 21:00:42)

## 2016-06-26 NOTE — Progress Notes (Signed)
PATIENT: Tammy [name] DOB: 08/14/1946  REASON FOR VISIT: follow up- progressive memory disturbance HISTORY FROM: patient  HISTORY OF PRESENT ILLNESS: Today 06/26/16: Tammy [name] is a 71 year old female with a history of progressive memory disturbance consistent with Alzheimer's disease. She returns today for follow-up. Her husband states that he stopped Aricept because he felt that it was causing her to wander at night. He states that since he discontinued the medication she has not had any more episodes. She requires assistance with ADLs. Her daughter and husband do all the cooking. Her husband handles all the finances. Patient is no longer driving. Patient's husband reports that her bedtime varies. She returns today for an evaluation.  HISTORY 12/26/15: Tammy [name] is a 71 year old right-handed black female with a history of a progressive memory disorder consistent with Alzheimer's disease. The patient last week had episodes where she would wander off, at one point she had gone for 2 hours and the husband did not know where she was. The patient at times has difficulty taking her pills at night, she will oftentimes refuse them. The patient is not having agitation or hallucinations. The patient is tolerating the Aricept, she is not having diarrhea or weight loss. The patient is brought in for an urgent evaluation.   REVIEW OF SYSTEMS: Out of a complete 14 system review of symptoms, the patient complains only of the following symptoms, and all other reviewed systems are negative.  Memory loss  ALLERGIES: Allergies  Allergen Reactions  . Sulfa Antibiotics Palpitations    HOME MEDICATIONS: Outpatient Medications Prior to Visit  Medication Sig Dispense Refill  . docusate sodium (COLACE) 100 MG capsule Take 1 capsule (100 mg total) by mouth daily. 30 capsule 11  . rivaroxaban (XARELTO) 20 MG TABS tablet Take 1 tablet (20 mg total) by mouth daily with supper. 30 tablet 4   No  facility-administered medications prior to visit.     PAST MEDICAL HISTORY: Past Medical History:  Diagnosis Date  . Alzheimer's disease 12/28/2014  . Headache disorder 12/28/2014  . Memory disturbance 12/28/2014    PAST SURGICAL HISTORY: History reviewed. No pertinent surgical history.  FAMILY HISTORY: Family History  Problem Relation Age of Onset  . Heart disease Brother   . Heart attack Mother   . Dementia Mother     SOCIAL HISTORY: Social History   Social History  . Marital status: Married    Spouse name: [name]  . Number of children: 3  . Years of education: 10   Occupational History  . retired    Social History Main Topics  . Smoking status: Never Smoker  . Smokeless tobacco: Never Used  . Alcohol use No  . Drug use: No  . Sexual activity: Yes   Other Topics Concern  . Not on file   Social History Narrative   Married with 3 children   Right handed   8th grade   1 cup coffee daily      PHYSICAL EXAM  Vitals:   06/26/16 0934  BP: (!) 141/97  Pulse: 70  Weight: 178 lb (80.7 kg)   Body mass index is 27.06 kg/m.   MMSE - Mini Mental State Exam 06/26/2016 12/26/2015 06/25/2015  Orientation to time 0 1 0  Orientation to Place 2 3 3   Registration 0 3 3  Attention/ Calculation 0 0 0  Recall 0 1 0  Language- name 2 objects 1 2 2   Language- repeat 0 1 1  Language- follow 3 step  command 3 3 3   Language- read & follow direction 0 0 0  Write a sentence 0 0 0  Copy design 0 0 0  Total score 6 14 12      Generalized: Well developed, in no acute distress   Neurological examination  Mentation: Alert. Follows all commands speech and language fluent Cranial nerve II-XII: Pupils were equal round reactive to light. Extraocular movements were full, visual field were full on confrontational test. Facial sensation and strength were normal. Uvula tongue midline. Head turning and shoulder shrug  were normal and symmetric. Motor: The motor testing reveals  5 over 5 strength of all 4 extremities. Good symmetric motor tone is noted throughout.  Sensory: Sensory testing is intact to soft touch on all 4 extremities. No evidence of extinction is noted.  Coordination: Cerebellar testing reveals good finger-nose-finger and heel-to-shin bilaterally.  Gait and station: Gait is normal.  Reflexes: Deep tendon reflexes are symmetric and normal bilaterally.   DIAGNOSTIC DATA (LABS, IMAGING, TESTING) - I reviewed patient records, labs, notes, testing and imaging myself where available.  Lab Results  Component Value Date   WBC 5.3 03/27/2016   HGB 10.6 (L) 03/27/2016   HCT 33.5 (L) 03/27/2016   MCV 88 03/27/2016   PLT 321 03/27/2016      Component Value Date/Time   NA 147 (H) 03/27/2016 1203   K 4.0 03/27/2016 1203   CL 108 (H) 03/27/2016 1203   CO2 25 03/27/2016 1203   GLUCOSE 87 03/27/2016 1203   GLUCOSE 99 02/24/2016 0449   BUN 12 03/27/2016 1203   CREATININE 0.80 03/27/2016 1203   CREATININE 0.63 12/19/2014 1246   CALCIUM 9.0 03/27/2016 1203   PROT 6.6 03/27/2016 1203   ALBUMIN 3.6 03/27/2016 1203   AST 11 03/27/2016 1203   ALT 7 03/27/2016 1203   ALKPHOS 46 03/27/2016 1203   BILITOT 0.3 03/27/2016 1203   GFRNONAA 76 03/27/2016 1203   GFRAA 88 03/27/2016 1203    Lab Results  Component Value Date   HGBA1C 5.6 03/27/2016   Lab Results  Component Value Date   DVVOHYWV37 106 02/22/2016   Lab Results  Component Value Date   TSH 1.180 02/22/2016      ASSESSMENT AND PLAN 71 y.o. year old female  has a past medical history of Alzheimer's disease (12/28/2014); Headache disorder (12/28/2014); and Memory disturbance (12/28/2014). here with:  1. Alzheimer's disease  The patient's memory score has declined since her last visit. Advised the patient and her husband that we should reconsider starting Aricept and  try taking it during the day. They are amenable to this plan. The patient will begin taking 5 mg daily for 2 weeks and if  tolerating increase to 10 mg daily thereafter. I reviewed side effects of Aricept with the patient and her husband. If her symptoms worsen or she develops new symptoms they will let us know. She will follow-up in 6 months or sooner if needed.  I spent 15 minutes with the patient. 50% of this time was spent discussing memory score   Ward Givens, MSN, NP-C 06/26/2016, 9:49 AM [facility] [address], [place], [place] [address] [phone]

## 2016-07-03 ENCOUNTER — Other Ambulatory Visit: Payer: Self-pay | Admitting: Family Medicine

## 2016-07-04 NOTE — Telephone Encounter (Signed)
Please call pt (she has alzheimer's that is sig progressed so you will need to actually speak to her husband.)  At our last visit 3 mos ago, they told me that they had stopped the donepazil (aricept) because she just refused to take it and also had stopped the mirtazapine because it was making her wander at night (we started it 1 tab at bedtime to help her sleep and increase appetite and help mood).  It looks like when they ewre in the ER last mo it got put back on her med list.  They saw the neurologist last wk who did not mention the mirtazapine. The neurologist wrote that they had stopped her donepazil as they were concerned it was making her wander at night but they were instructed to restart the donepazil and take it in the morning instead. Has she also been taking the mirtazapine again?  For how long?  If she is wondering at night, better to stop the mirtazapine rather than the donepazil if they are not sure which one is causing it. Happy to refill the mirtazapine if it is needed - I would just like to confirm that she has been taking it, she is doing well on it, and they have also restarted the donepazil. Want to make sure there wasn't confusion between these two different meds. Thanks.

## 2016-07-31 ENCOUNTER — Encounter: Payer: Medicare Other | Admitting: Family Medicine

## 2016-08-02 NOTE — Telephone Encounter (Signed)
Nevermind - looks liks pt has a CPE sched for MOn 7/23 - Will discuss w/ pt and her husband at that time

## 2016-08-02 NOTE — Telephone Encounter (Signed)
So what was the below conversation with her husband?  Is she taking this or was the refill pharmacy generated??

## 2016-08-03 NOTE — Progress Notes (Signed)
Subjective:    Tammy [name] "Tammy [name]" is a 71 y.o. female who presents for Medicare Initial preventive examination.  I last saw her 4 months prior for a follow-up of acute PE  Preventive Screening-Counseling & Management  Tobacco History  Smoking Status  . Never Smoker  Smokeless Tobacco  . Never Used     Problems Prior to Visit 1. good appetite and has gained the weight back.   No bloating, abd distension, pain, constipation, diarrhea.   Is fasting today.  No vitamins  Current Problems (verified) Patient Active Problem List   Diagnosis Date Noted  . Pelvic mass in female 03/11/2016  . Right femoral vein DVT ([place]) 02/24/2016  . Ovarian mass - right seen on CT - was referred by gyn Dr. Toney Rakes to gyn onc Dr. 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   Periodically c/o abd discomfort. BMs were irregular and sporadic.  02/24/2016  . Pulmonary embolus (HCC) - multiple, bilateral in all lobes on 2/10, pt was hospitalized x 3d, no right heart strain. On xarelto 02/23/2016  . Vitamin D deficiency - level 10 12/2014 01/02/2015  . Prediabetes - resolved Lab Results  Component Value Date   HGBA1C 5.6 03/27/2016   HGBA1C 5.7 12/19/2014   HGBA1C 5.7 12/19/2014   01/02/2015  . Memory disturbance 12/28/2014  . Headache disorder 12/28/2014  . Alzheimer's disease -Progressive, early onset, following with [facility] Dr. Jannifer Franklin. Causes medication non-compliance as freq refuses to take pills and husband does not like to force her when she is being argumentative. Nml vit B12, tsh, UClx 02/2016. 12/28/2014    Medications Prior to Visit Current Outpatient Prescriptions on File Prior to Visit  Medication Sig Dispense Refill  . docusate sodium (COLACE) 100 MG capsule Take 1 capsule  (100 mg total) by mouth daily. 30 capsule 11  . donepezil (ARICEPT) 5 MG tablet Take 1 tablet daily for 2 weeks then increase to 2 tablets daily thereafter 30 tablet 5  . mirtazapine (REMERON) 15 MG tablet TK 1 T PO HS  2  . rivaroxaban (XARELTO) 20 MG TABS tablet Take 1 tablet (20 mg total) by mouth daily with supper. 30 tablet 4   No current facility-administered medications on file prior to visit.     Current Medications (verified) Current Outpatient Prescriptions  Medication Sig Dispense Refill  . docusate sodium (COLACE) 100 MG capsule Take 1 capsule (100 mg total) by mouth daily. 30 capsule 11  . donepezil (ARICEPT) 5 MG tablet Take 1 tablet daily for 2 weeks then increase to 2 tablets daily thereafter 30 tablet 5  . mirtazapine (REMERON) 15 MG tablet TK 1 T PO HS  2  . rivaroxaban (XARELTO) 20 MG TABS tablet Take 1 tablet (20 mg total) by mouth daily with supper. 30 tablet 4   No current facility-administered medications for this visit.      Allergies (verified) Sulfa antibiotics   PAST HISTORY  Family History Family History  Problem Relation Age of Onset  . Heart disease Brother   . Heart attack Mother   . Dementia Mother     Social History Social History  Substance Use Topics  . Smoking status: Never Smoker  . Smokeless tobacco: Never Used  . Alcohol use No  Are there smokers in your home (other than you)? No  Risk Factors Current exercise habits: Exercise is limited by neurologic condition(s): dementia.  Dietary issues discussed: NA   Cardiac risk factors: advanced age (older than 81 for men, 63 for women) and sedentary lifestyle.  Depression Screen Depression screen Tammy [name] 2/9 08/04/2016 03/27/2016 02/28/2016 02/28/2016 12/19/2014  Decreased Interest 0 0 0 0 0  Down, Depressed, Hopeless 0 0 0 0 0  PHQ - 2 Score 0 0 0 0 0     Activities of Daily Living In your present state of health, do you have any difficulty performing the following activities?:   Driving? Yes Managing money?  Yes Feeding yourself? No Getting from bed to chair? No Climbing a flight of stairs? No Preparing food and eating?: No Bathing or showering? No Getting dressed: No Getting to the toilet? No Using the toilet:Yes Moving around from place to place: No In the past year have you fallen or had a near fall?:No   Are you sexually active?  Yes  Do you have more than one partner?  No  Hearing Difficulties: Yes Do you often ask people to speak up or repeat themselves? Yes Do you experience ringing or noises in your ears? No Do you have difficulty understanding soft or whispered voices? Yes   Do you feel that you have a problem with memory? Yes  Do you often misplace items? Yes  Do you feel safe at home?  Yes  Cognitive Testing  Alert? Yes  Normal Appearance?Yes  Oriented to person? Yes  Place? No   Time? No  Recall of three objects?  No  Can perform simple calculations? No  Displays appropriate judgment?Yes   Advanced Directives have been discussed with the patient? Yes  List the Names of Other Physician/Practitioners you currently use: 1.  none  Indicate any recent Medical Services you may have received from other than [facility] providers in the past year (date may be approximate).  Immunization History  Administered Date(s) Administered  . Influenza,inj,Quad PF,36+ Mos 12/19/2014, 02/22/2016    Screening Tests Health Maintenance  Topic Date Due  . Hepatitis C Screening  08/14/1946  . TETANUS/TDAP  08/14/1966  . MAMMOGRAM  08/13/1997  . COLONOSCOPY  08/13/1997  . DEXA SCAN  08/13/2012  . PNA vac Low Risk Adult (1 of 2 - PCV13) 08/13/2012  . INFLUENZA VACCINE  08/13/2016    All answers were reviewed with the patient and necessary referrals were made:  [facility], MD   08/03/2016   History reviewed: allergies, current medications, past family history, past medical history, past social history, past surgical history and problem list  Review of  Systems Pertinent items are noted in HPI.    Objective:  BP (!) 151/96   Pulse 73   Temp 97.9 F (36.6 C) (Oral)   Resp 18   Ht 5\' 8"  (1.727 m)   Wt 176 lb 12.8 oz (80.2 kg)   SpO2 98%   BMI 26.88 kg/m   Visual Acuity Screening   Right eye Left eye Both eyes  Without correction:     With correction: 20/100 20/100 20/100    BP (!) 151/96   Pulse 73   Temp 97.9 F (36.6 C) (Oral)   Resp 18   Ht 5\' 8"  (1.727 m)   Wt 176 lb 12.8 oz (80.2 kg)   SpO2 98%   BMI 26.88 kg/m   General Appearance:    Alert, cooperative, no distress, appears stated age  Head:    Normocephalic, without obvious abnormality, atraumatic  Eyes:    PERRL, conjunctiva/corneas clear, EOM's intact, fundi    benign, both eyes  Ears:    Normal TM's and external ear canals, both ears  Nose:   Nares normal, septum midline, mucosa normal, no drainage    or sinus tenderness  Throat:   Lips, mucosa, and tongue normal; teeth and gums normal  Neck:   Supple, symmetrical, trachea midline, no adenopathy;    thyroid:  no enlargement/tenderness/nodules; no carotid   bruit or JVD  Back:     Symmetric, no curvature, ROM normal, no CVA tenderness  Lungs:     Clear to auscultation bilaterally, respirations unlabored  Chest Wall:    No tenderness or deformity   Heart:    Regular rate and rhythm, S1 and S2 normal, no murmur, rub   or gallop  Breast Exam:    No tenderness, masses, or nipple abnormality  Abdomen:     Soft, non-tender, bowel sounds active all four quadrants,    no masses, no organomegaly  Genitalia:    Normal female without lesion, discharge or tenderness  Rectal:    Normal tone, normal prostate, no masses or tenderness;   guaiac negative stool  Extremities:   Extremities normal, atraumatic, no cyanosis or edema  Pulses:   2+ and symmetric all extremities  Skin:   Skin color, texture, turgor normal, no rashes or lesions  Lymph nodes:   Cervical, supraclavicular, and axillary nodes normal  Neurologic:    CNII-XII intact, normal strength, sensation and reflexes    throughout       Assessment:     1. Initial Medicare annual wellness visit   2. Encounter for screening mammogram for breast cancer   3. Postmenopausal estrogen deficiency   4. Screening for cardiovascular, respiratory, and genitourinary diseases   5. Screening for colorectal cancer   6. Screening for deficiency anemia   7. Screening for thyroid disorder   8. Routine screening for STI (sexually transmitted infection)   9. Early onset Alzheimer's disease with behavioral disturbance   10. Other acute pulmonary embolism without acute cor pulmonale (HCC)   11. Vitamin D deficiency   12. Prediabetes   13. Pelvic mass in female   35. Headache disorder   15. Memory disturbance         Plan:  Lipid?? - will not check. . . Is fasting REFER TO HEMATOLOGY - Not sure if this was provoked or not - did travel by car on vaca when developed.  I'm not sure what the risk/benefit ratio is to d/c'ing the anticoag and if she needs any repeat imaging to check for clearance.   During the course of the visit the patient was educated and counseled about appropriate screening and preventive services including:      Needs tetanus booster whenever has cut or bite or other injury  Hep C screen done today  Needs to go get mammogram and D[facility] ? Discuss goals of care? Life extending primary prevention screening may not be desired since patient artery has a terminal disease and Alzheimer's  CRCS: colonoscopy vs cologard?  Rec pneumococcal-13 vaccine today  Shingrix to pharm?    Diet review for nutrition referral? Yes ____  Not Indicated ____   Patient Instructions (the written plan) was given to the patient.  Medicare Attestation I have personally reviewed: The patient's medical and social history Their use of alcohol, tobacco or  illicit drugs Their current medications and supplements The  patient's functional ability including ADLs,fall risks, home safety risks, cognitive, and hearing and visual impairment Diet and physical activities Evidence for depression or mood disorders  The patient's weight, height, BMI, and visual acuity have been recorded in the chart.  I have made referrals, counseling, and provided education to the patient based on review of the above and I have provided the patient with a written personalized care plan for preventive services.     Delman Cheadle, MD   08/03/2016   Call with med[facility] so I can refill remeron if she is taking it.   Spent extensive time discussing end of life care and advanced directives. Pt is not able to grasp most of the conversation nor answer any of my questions but always looks to her husband and states that she would let him make the decision. Discussed how she would not want to be a burden to her family and so would not want to prolong life if she was going to have very poor quality though currently feels she has excellent quality. Husband is HCPOA but if he was not avail would defer to her younger daughter Dewaine Oats. Would be ok with CPR currently.   However, chooses to defer screening due to limited life expectancy and diagnosis of terminal illness with early-onset rapidly progressing Alzheimer's dementia. She would not want to consider treatment for any cancer until it was adversely affecting her and would then be ok with palliative treatment only.  Orders Placed This Encounter  Procedures  . Pneumococcal conjugate vaccine 13-valent IM  . HCV Ab w/Rflx to Verification  . CBC with Differential/Platelet  . Comprehensive metabolic panel  . TSH  . Hemoglobin A1c  . VITAMIN D 25 Hydroxy (Vit-D Deficiency, Fractures)  . Interpretation:  . Ambulatory referral to Hematology    Referral Priority:   Routine    Referral Type:   Consultation    Referral Reason:   Specialty Services Required    Requested Specialty:   Oncology    Number of  Visits Requested:   1  . Care order/instruction:    AVS printed - let patient go!    Meds ordered this encounter  Medications  . donepezil (ARICEPT) 10 MG tablet    Sig: Take 1 tablet (10 mg total) by mouth at bedtime.    Dispense:  30 tablet    Refill:  5     Delman Cheadle, M.D.  Primary Care at [facility] [address] [place], [place] [address] [phone] phone [phone] fax  08/06/16 9:51 PM

## 2016-08-04 ENCOUNTER — Ambulatory Visit (INDEPENDENT_AMBULATORY_CARE_PROVIDER_SITE_OTHER): Payer: Medicare Other | Admitting: Family Medicine

## 2016-08-04 ENCOUNTER — Encounter: Payer: Self-pay | Admitting: Family Medicine

## 2016-08-04 VITALS — BP 151/96 | HR 73 | Temp 97.9°F | Resp 18 | Ht 68.0 in | Wt 176.8 lb

## 2016-08-04 DIAGNOSIS — I2699 Other pulmonary embolism without acute cor pulmonale: Secondary | ICD-10-CM | POA: Diagnosis not present

## 2016-08-04 DIAGNOSIS — G300 Alzheimer's disease with early onset: Secondary | ICD-10-CM

## 2016-08-04 DIAGNOSIS — Z113 Encounter for screening for infections with a predominantly sexual mode of transmission: Secondary | ICD-10-CM

## 2016-08-04 DIAGNOSIS — Z130 Encounter for screening for diseases of the blood and blood-forming organs and certain disorders involving the immune mechanism: Secondary | ICD-10-CM

## 2016-08-04 DIAGNOSIS — Z1383 Encounter for screening for respiratory disorder NEC: Secondary | ICD-10-CM

## 2016-08-04 DIAGNOSIS — Z0000 Encounter for general adult medical examination without abnormal findings: Secondary | ICD-10-CM

## 2016-08-04 DIAGNOSIS — Z136 Encounter for screening for cardiovascular disorders: Secondary | ICD-10-CM | POA: Diagnosis not present

## 2016-08-04 DIAGNOSIS — Z1329 Encounter for screening for other suspected endocrine disorder: Secondary | ICD-10-CM | POA: Diagnosis not present

## 2016-08-04 DIAGNOSIS — Z1389 Encounter for screening for other disorder: Secondary | ICD-10-CM

## 2016-08-04 DIAGNOSIS — R413 Other amnesia: Secondary | ICD-10-CM

## 2016-08-04 DIAGNOSIS — E559 Vitamin D deficiency, unspecified: Secondary | ICD-10-CM

## 2016-08-04 DIAGNOSIS — R519 Headache, unspecified: Secondary | ICD-10-CM

## 2016-08-04 DIAGNOSIS — Z780 Asymptomatic menopausal state: Secondary | ICD-10-CM

## 2016-08-04 DIAGNOSIS — R1900 Intra-abdominal and pelvic swelling, mass and lump, unspecified site: Secondary | ICD-10-CM

## 2016-08-04 DIAGNOSIS — Z1231 Encounter for screening mammogram for malignant neoplasm of breast: Secondary | ICD-10-CM

## 2016-08-04 DIAGNOSIS — R7303 Prediabetes: Secondary | ICD-10-CM

## 2016-08-04 DIAGNOSIS — F02818 Dementia in other diseases classified elsewhere, unspecified severity, with other behavioral disturbance: Secondary | ICD-10-CM

## 2016-08-04 DIAGNOSIS — R51 Headache: Secondary | ICD-10-CM

## 2016-08-04 DIAGNOSIS — Z1211 Encounter for screening for malignant neoplasm of colon: Secondary | ICD-10-CM

## 2016-08-04 DIAGNOSIS — Z1212 Encounter for screening for malignant neoplasm of rectum: Secondary | ICD-10-CM

## 2016-08-04 DIAGNOSIS — Z23 Encounter for immunization: Secondary | ICD-10-CM | POA: Diagnosis not present

## 2016-08-04 DIAGNOSIS — F0281 Dementia in other diseases classified elsewhere with behavioral disturbance: Secondary | ICD-10-CM

## 2016-08-04 MED ORDER — DONEPEZIL HCL 10 MG PO TABS: 10.0000 mg | ORAL_TABLET | Freq: Every day | ORAL | 5 refills | Status: DC

## 2016-08-04 NOTE — Patient Instructions (Addendum)
IF you received an x-ray today, you will receive an invoice from [facility]. Please contact [facility] at [phone] with questions or concerns regarding your invoice.   IF you received labwork today, you will receive an invoice from [facility]. Please contact [facility] at [phone] with questions or concerns regarding your invoice.   Our billing staff will not be able to assist you with questions regarding bills from these companies.  You will be contacted with the lab results as soon as they are available. The fastest way to get your results is to activate your My Chart account. Instructions are located on the last page of this paperwork. If you have not heard from [place] regarding the results in 2 weeks, please contact this office.     Tammy [name] , Thank you for taking time to come for your Medicare Wellness Visit. I appreciate your ongoing commitment to your health goals. Please review the following plan we discussed and let me know if I can assist you in the future.   These are the goals we discussed: Goals    None      This is a list of the screening recommended for you and due dates:  Health Maintenance  Topic Date Due  .  Hepatitis C: One time screening is recommended by Center for Disease Control  (CDC) for  adults born from [birth year] through 1964.   [birth year]  . Tetanus Vaccine  08/14/1966  . Mammogram  08/13/1997  . Colon Cancer Screening  08/13/1997  . DEXA scan (bone density measurement)  08/13/2012  . Pneumonia vaccines (1 of 2 - PCV13) 08/13/2012  . Flu Shot  08/13/2016     Advance Directive Advance directives are legal documents that let you make choices ahead of time about your health care and medical treatment in case you become unable to communicate for yourself. Advance directives are a way for you to communicate your wishes to family, friends, and health care providers. This can help convey your decisions about end-of-life care if you become  unable to communicate. Discussing and writing advance directives should happen over time rather than all at once. Advance directives can be changed depending on your situation and what you want, even after you have signed the advance directives. If you do not have an advance directive, some states assign family decision makers to act on your behalf based on how closely you are related to them. Each state has its own laws regarding advance directives. You may want to check with your health care provider, attorney, or state representative about the laws in your state. There are different types of advance directives, such as:  Medical power of attorney.  Living will.  Do not resuscitate (DNR) or do not attempt resuscitation (DNAR) order.  Health care proxy and medical power of attorney A health care proxy, also called a health care agent, is a person who is appointed to make medical decisions for you in cases in which you are unable to make the decisions yourself. Generally, people choose someone they know well and trust to represent their preferences. Make sure to ask this person for an agreement to act as your proxy. A proxy may have to exercise judgment in the event of a medical decision for which your wishes are not known. A medical power of attorney is a legal document that names your health care proxy. Depending on the laws in your state, after the document is written, it may also need to  be:  Signed.  Notarized.  Dated.  Copied.  Witnessed.  Incorporated into your medical record.  You may also want to appoint someone to manage your financial affairs in a situation in which you are unable to do so. This is called a durable power of attorney for finances. It is a separate legal document from the durable power of attorney for health care. You may choose the same person or someone different from your health care proxy to act as your agent in financial matters. If you do not appoint a  proxy, or if there is a concern that the proxy is not acting in your best interests, a court-appointed guardian may be designated to act on your behalf. Living will A living will is a set of instructions documenting your wishes about medical care when you cannot express them yourself. Health care providers should keep a copy of your living will in your medical record. You may want to give a copy to family members or friends. To alert caregivers in case of an emergency, you can place a card in your wallet to let them know that you have a living will and where they can find it. A living will is used if you become:  Terminally ill.  Incapacitated.  Unable to communicate or make decisions.  Items to consider in your living will include:  The use or non-use of life-sustaining equipment, such as dialysis machines and breathing machines (ventilators).  A DNR or DNAR order, which is the instruction not to use cardiopulmonary resuscitation (CPR) if breathing or heartbeat stops.  The use or non-use of tube feeding.  Withholding of food and fluids.  Comfort (palliative) care when the goal becomes comfort rather than a cure.  Organ and tissue donation.  A living will does not give instructions for distributing your money and property if you should pass away. It is recommended that you seek the advice of a lawyer when writing a will. Decisions about taxes, beneficiaries, and asset distribution will be legally binding. This process can relieve your family and friends of any concerns surrounding disputes or questions that may come up about the distribution of your assets. DNR or DNAR A DNR or DNAR order is a request not to have CPR in the event that your heart stops beating or you stop breathing. If a DNR or DNAR order has not been made and shared, a health care provider will try to help any patient whose heart has stopped or who has stopped breathing. If you plan to have surgery, talk with your health  care provider about how your DNR or DNAR order will be followed if problems occur. Summary  Advance directives are the legal documents that allow you to make choices ahead of time about your health care and medical treatment in case you become unable to communicate for yourself.  The process of discussing and writing advance directives should happen over time. You can change the advance directives, even after you have signed them.  Advance directives include DNR or DNAR orders, living wills, and designating an agent as your medical power of attorney. This information is not intended to replace advice given to you by your health care provider. Make sure you discuss any questions you have with your health care provider. Document Released: 04/08/2007 Document Revised: 11/19/2015 Document Reviewed: 11/19/2015 Elsevier Interactive Patient Education  2017 [employer].

## 2016-08-05 LAB — CBC WITH DIFFERENTIAL/PLATELET
BASOS ABS: 0.1 10*3/uL (ref 0.0–0.2)
BASOS: 1 %
EOS (ABSOLUTE): 0.1 10*3/uL (ref 0.0–0.4)
Eos: 1 %
Hematocrit: 38.8 % (ref 34.0–46.6)
Hemoglobin: 12.6 g/dL (ref 11.1–15.9)
Immature Grans (Abs): 0 10*3/uL (ref 0.0–0.1)
Immature Granulocytes: 0 %
LYMPHS ABS: 1.8 10*3/uL (ref 0.7–3.1)
Lymphs: 36 %
MCH: 27.5 pg (ref 26.6–33.0)
MCHC: 32.5 g/dL (ref 31.5–35.7)
MCV: 85 fL (ref 79–97)
MONOS ABS: 0.3 10*3/uL (ref 0.1–0.9)
Monocytes: 5 %
NEUTROS ABS: 2.8 10*3/uL (ref 1.4–7.0)
NEUTROS PCT: 57 %
PLATELETS: 391 10*3/uL — AB (ref 150–379)
RBC: 4.58 x10E6/uL (ref 3.77–5.28)
RDW: 14.5 % (ref 12.3–15.4)
WBC: 4.9 10*3/uL (ref 3.4–10.8)

## 2016-08-05 LAB — COMPREHENSIVE METABOLIC PANEL
A/G RATIO: 1.2 (ref 1.2–2.2)
ALK PHOS: 72 IU/L (ref 39–117)
ALT: 10 IU/L (ref 0–32)
AST: 14 IU/L (ref 0–40)
Albumin: 4.3 g/dL (ref 3.6–4.8)
BILIRUBIN TOTAL: 0.3 mg/dL (ref 0.0–1.2)
BUN / CREAT RATIO: 18 (ref 12–28)
BUN: 14 mg/dL (ref 8–27)
CHLORIDE: 105 mmol/L (ref 96–106)
CO2: 24 mmol/L (ref 20–29)
Calcium: 9.8 mg/dL (ref 8.7–10.3)
Creatinine, Ser: 0.78 mg/dL (ref 0.57–1.00)
GFR calc non Af Amer: 78 mL/min/{1.73_m2} (ref >59)
GFR, EST AFRICAN AMERICAN: 90 mL/min/{1.73_m2} (ref >59)
GLUCOSE: 112 mg/dL — AB (ref 65–99)
Globulin, Total: 3.5 g/dL (ref 1.5–4.5)
POTASSIUM: 3.9 mmol/L (ref 3.5–5.2)
Sodium: 146 mmol/L — ABNORMAL HIGH (ref 134–144)
Total Protein: 7.8 g/dL (ref 6.0–8.5)

## 2016-08-05 LAB — HEMOGLOBIN A1C
ESTIMATED AVERAGE GLUCOSE: 120 mg/dL
Hgb A1c MFr Bld: 5.8 % — ABNORMAL HIGH (ref 4.8–5.6)

## 2016-08-05 LAB — VITAMIN D 25 HYDROXY (VIT D DEFICIENCY, FRACTURES): VIT D 25 HYDROXY: 17.2 ng/mL — AB (ref 30.0–100.0)

## 2016-08-05 LAB — HCV INTERPRETATION

## 2016-08-05 LAB — HCV AB W/RFLX TO VERIFICATION: HCV Ab: <0.1 s/co ratio (ref 0.0–0.9)

## 2016-08-05 LAB — TSH: TSH: 1.04 u[IU]/mL (ref 0.450–4.500)

## 2016-08-12 ENCOUNTER — Encounter: Payer: Self-pay | Admitting: Neurology

## 2016-08-14 ENCOUNTER — Other Ambulatory Visit: Payer: Self-pay | Admitting: Family Medicine

## 2016-08-14 NOTE — Telephone Encounter (Signed)
Note from visit 08/04/2016 stated to call if pt taking Remeron Rec'd refill request for Remeron Sent to Dr. Brigitte Pulse to advise.

## 2016-08-15 NOTE — Telephone Encounter (Signed)
Perfect! Thanks so much!

## 2016-08-15 NOTE — Telephone Encounter (Signed)
Please call husband and have him look at bottles to see if she is taking.  I think this might be a pharmacy generated request.

## 2016-08-15 NOTE — Telephone Encounter (Signed)
Spoke with patient's husband and had him read me medications that she was currently taking by reading from the medication bottles. Patient is taking the remeron. Pended orders for 3 months worth

## 2016-08-15 NOTE — Telephone Encounter (Signed)
Called and pt husband not available. Asked [place] to call back later.

## 2016-08-15 NOTE — Telephone Encounter (Signed)
Second call. No answer.

## 2016-09-08 ENCOUNTER — Encounter: Payer: Self-pay | Admitting: Hematology and Oncology

## 2016-09-24 ENCOUNTER — Encounter: Payer: Self-pay | Admitting: Hematology and Oncology

## 2016-09-24 ENCOUNTER — Telehealth: Payer: Self-pay | Admitting: Hematology and Oncology

## 2016-09-24 ENCOUNTER — Ambulatory Visit (HOSPITAL_BASED_OUTPATIENT_CLINIC_OR_DEPARTMENT_OTHER): Payer: Medicare Other | Admitting: Hematology and Oncology

## 2016-09-24 VITALS — BP 164/84 | HR 65 | Temp 98.4°F | Resp 18 | Ht 68.0 in | Wt 180.9 lb

## 2016-09-24 DIAGNOSIS — Z7901 Long term (current) use of anticoagulants: Secondary | ICD-10-CM

## 2016-09-24 DIAGNOSIS — I82411 Acute embolism and thrombosis of right femoral vein: Secondary | ICD-10-CM | POA: Diagnosis not present

## 2016-09-24 DIAGNOSIS — I2699 Other pulmonary embolism without acute cor pulmonale: Secondary | ICD-10-CM | POA: Diagnosis not present

## 2016-09-24 NOTE — Telephone Encounter (Signed)
Gave patient AVS and calendar for upcoming September appointment.

## 2016-09-26 NOTE — Progress Notes (Signed)
[place] New Visit:  Assessment: Right femoral vein DVT [facility]) 71 year old female who has developed right lower extremity deep vein thrombosis complicated by bilateral pulmonary emboli without right heart strain in February 2018 following a road trip. Subsequently, patient has been on therapeutic anticoagulation with Rivaroxaban (Xarelto) with complete resolution of the symptoms.  History of present a provoked thromboembolism. It would be reasonable to obtain a repeat Doppler over the right lower extremity to assess for state of the venous structures for possible possible phlebitic syndrome. If significant abnormalities are discovered, it would be reasonable to discontinue therapeutic anticoagulation that places patient at persistent risk of excessive bleeding. If significant abnormalities in the veins are discovered, continued anticoagulation may be justified.  I don't think that there is a need for thrombophilia evaluation this point in time in presence of provoking factor, absence of family history or personal history of thrombosis in the past.  Plan: --Rt LE Doppler US --Return to clinic in 1 week to discuss the findings and to review the recommendations Voice recognition software was used and creation of this note. Despite my best effort at editing the text, some misspelling/errors may have occurred.  No orders of the defined types were placed in this encounter.   All questions were answered.  . The patient knows to call the clinic with any problems, questions or concerns.  This note was electronically signed.    History of Presenting Illness Tammy [name] 71 y.o. presenting to the [facility] for advice regarding possible discontinuation of therapeutic anticoagulation for previous history of deep vein thrombosis and pulmonary embolism. Patient had an episode of pulmonary embolism that was diagnosed in February 2018 the patient presented with chest discomfort.  She was found to have multiple bilateral pulmonary emboli as well as deep vein thrombosis in the right lower extremity. Patient had a road trip earlier that month. She was treated with Rivaroxaban (Xarelto) with rapid resolution of swelling in the lower extremities as well as complete resolution of the chest symptoms. At this time, patient reports feeling quite well without any additional complaints.  Medical History: Past Medical History:  Diagnosis Date  . Alzheimer's disease 12/28/2014  . Headache disorder 12/28/2014  . Memory disturbance 12/28/2014    Surgical History: History reviewed. No pertinent surgical history.  Family History: Family History  Problem Relation Age of Onset  . Heart disease Brother   . Heart attack Mother   . Dementia Mother     Social History: Social History   Social History  . Marital status: Married    Spouse name: Tammy [name]  . Number of children: 3  . Years of education: 10   Occupational History  . retired    Social History Main Topics  . Smoking status: Never Smoker  . Smokeless tobacco: Never Used  . Alcohol use No  . Drug use: No  . Sexual activity: Yes   Other Topics Concern  . Not on file   Social History Narrative   Married with 3 children   Right handed   8th grade   1 cup coffee daily    Allergies: Allergies  Allergen Reactions  . Sulfa Antibiotics Palpitations    Medications:  Current Outpatient Prescriptions  Medication Sig Dispense Refill  . docusate sodium (COLACE) 100 MG capsule Take 1 capsule (100 mg total) by mouth daily. 30 capsule 11  . donepezil (ARICEPT) 10 MG tablet Take 1 tablet (10 mg total) by mouth at bedtime. 30 tablet 5  .  mirtazapine (REMERON) 15 MG tablet TAKE 1 TABLET BY MOUTH AT BEDTIME 30 tablet 5  . rivaroxaban (XARELTO) 20 MG TABS tablet Take 1 tablet (20 mg total) by mouth daily with supper. 30 tablet 4   No current facility-administered medications for this visit.     Review of  Systems: Review of Systems  Respiratory: Negative for shortness of breath.   Cardiovascular: Negative for leg swelling.  Neurological: Negative for extremity weakness.  All other systems reviewed and are negative.    PHYSICAL EXAMINATION Blood pressure (!) 164/84, pulse 65, temperature 98.4 F (36.9 C), temperature source Oral, resp. rate 18, height 5\' 8"  (1.727 m), weight 180 lb 14.4 oz (82.1 kg), SpO2 100 %.  ECOG PERFORMANCE STATUS: 2 - Symptomatic, <50% confined to bed  Physical Exam  Constitutional: She is oriented to person, place, and time and well-developed, well-nourished, and in no distress.  HENT:  Head: Normocephalic and atraumatic.  Mouth/Throat: No oropharyngeal exudate.  Eyes: Pupils are equal, round, and reactive to light. Conjunctivae and EOM are normal. No scleral icterus.  Neck: Normal range of motion. No thyromegaly present.  Cardiovascular: Normal rate and regular rhythm.   No murmur heard. Pulmonary/Chest: Effort normal and breath sounds normal. No respiratory distress. She has no wheezes.  Abdominal: Soft. Bowel sounds are normal. She exhibits no mass. There is no tenderness. There is no rebound.  Musculoskeletal: Normal range of motion. She exhibits no edema.  Lymphadenopathy:    She has no cervical adenopathy.  Neurological: She is alert and oriented to person, place, and time. She has normal reflexes. No cranial nerve deficit.  Skin: She is not diaphoretic.  Vitals reviewed.   LABORATORY DATA: I have personally reviewed the data as listed: No visits with results within 1 Week(s) from this visit.  Latest known visit with results is:  Office Visit on 08/04/2016  Component Date Value Ref Range Status  . HCV Ab 08/04/2016 <0.1  0.0 - 0.9 s/co ratio Final  . WBC 08/04/2016 4.9  3.4 - 10.8 x10E3/uL Final  . RBC 08/04/2016 4.58  3.77 - 5.28 x10E6/uL Final  . Hemoglobin 08/04/2016 12.6  11.1 - 15.9 g/dL Final  . Hematocrit 08/04/2016 38.8  34.0 - 46.6 %  Final  . MCV 08/04/2016 85  79 - 97 fL Final  . MCH 08/04/2016 27.5  26.6 - 33.0 pg Final  . MCHC 08/04/2016 32.5  31.5 - 35.7 g/dL Final  . RDW 08/04/2016 14.5  12.3 - 15.4 % Final  . Platelets 08/04/2016 391* 150 - 379 x10E3/uL Final  . Neutrophils 08/04/2016 57  Not Estab. % Final  . Lymphs 08/04/2016 36  Not Estab. % Final  . Monocytes 08/04/2016 5  Not Estab. % Final  . Eos 08/04/2016 1  Not Estab. % Final  . Basos 08/04/2016 1  Not Estab. % Final  . Neutrophils Absolute 08/04/2016 2.8  1.4 - 7.0 x10E3/uL Final  . Lymphocytes Absolute 08/04/2016 1.8  0.7 - 3.1 x10E3/uL Final  . Monocytes Absolute 08/04/2016 0.3  0.1 - 0.9 x10E3/uL Final  . EOS (ABSOLUTE) 08/04/2016 0.1  0.0 - 0.4 x10E3/uL Final  . Basophils Absolute 08/04/2016 0.1  0.0 - 0.2 x10E3/uL Final  . Immature Granulocytes 08/04/2016 0  Not Estab. % Final  . Immature Grans (Abs) 08/04/2016 0.0  0.0 - 0.1 x10E3/uL Final  . Glucose 08/04/2016 112* 65 - 99 mg/dL Final  . BUN 08/04/2016 14  8 - 27 mg/dL Final  . Creatinine, Ser 08/04/2016 0.78  0.57 - 1.00 mg/dL Final  . GFR calc non Af Amer 08/04/2016 78  >59 mL/min/1.73 Final  . GFR calc Af Amer 08/04/2016 90  >59 mL/min/1.73 Final  . BUN/Creatinine Ratio 08/04/2016 18  12 - 28 Final  . Sodium 08/04/2016 146* 134 - 144 mmol/L Final  . Potassium 08/04/2016 3.9  3.5 - 5.2 mmol/L Final  . Chloride 08/04/2016 105  96 - 106 mmol/L Final  . CO2 08/04/2016 24  20 - 29 mmol/L Final  . Calcium 08/04/2016 9.8  8.7 - 10.3 mg/dL Final  . Total Protein 08/04/2016 7.8  6.0 - 8.5 g/dL Final  . Albumin 08/04/2016 4.3  3.6 - 4.8 g/dL Final  . Globulin, Total 08/04/2016 3.5  1.5 - 4.5 g/dL Final  . Albumin/Globulin Ratio 08/04/2016 1.2  1.2 - 2.2 Final  . Bilirubin Total 08/04/2016 0.3  0.0 - 1.2 mg/dL Final  . Alkaline Phosphatase 08/04/2016 72  39 - 117 IU/L Final  . AST 08/04/2016 14  0 - 40 IU/L Final  . ALT 08/04/2016 10  0 - 32 IU/L Final  . TSH 08/04/2016 1.040  0.450 - 4.500  uIU/mL Final  . Hgb A1c MFr Bld 08/04/2016 5.8* 4.8 - 5.6 % Final   Comment:          Pre-diabetes: 5.7 - 6.4          Diabetes: >6.4          Glycemic control for adults with diabetes: <7.0   . Est. average glucose Bld gHb Est-m* 08/04/2016 120  mg/dL Final  . Vit D, 25-Hydroxy 08/04/2016 17.2* 30.0 - 100.0 ng/mL Final   Comment: Vitamin D deficiency has been defined by the Institute of Medicine and an Endocrine Society practice guideline as a level of serum 25-OH vitamin D less than 20 ng/mL (1,2). The Endocrine Society went on to further define vitamin D insufficiency as a level between 21 and 29 ng/mL (2). 1. IOM (Institute of Medicine). 2010. Dietary reference    intakes for calcium and D. [place]: The    [employer]. 2. Holick MF, Binkley [place], Bischoff-Ferrari HA, et al.    Evaluation, treatment, and prevention of vitamin D    deficiency: an Endocrine Society clinical practice    guideline. JCEM. 2011 Jul; 96(7):1911-30.   Marland Kitchen HCV interp 1: 08/04/2016 Comment   Final   Comment: Negative Not infected with HCV, unless recent infection is suspected or other evidence exists to indicate HCV infection.          Ardath Sax, MD

## 2016-09-26 NOTE — Assessment & Plan Note (Signed)
71 year old female who has developed right lower extremity deep vein thrombosis complicated by bilateral pulmonary emboli without right heart strain in February 2018 following a road trip. Subsequently, patient has been on therapeutic anticoagulation with Rivaroxaban (Xarelto) with complete resolution of the symptoms.  History of present a provoked thromboembolism. It would be reasonable to obtain a repeat Doppler over the right lower extremity to assess for state of the venous structures for possible possible phlebitic syndrome. If significant abnormalities are discovered, it would be reasonable to discontinue therapeutic anticoagulation that places patient at persistent risk of excessive bleeding. If significant abnormalities in the veins are discovered, continued anticoagulation may be justified.  I don't think that there is a need for thrombophilia evaluation this point in time in presence of provoking factor, absence of family history or personal history of thrombosis in the past.  Plan: --Rt LE Doppler US --Return to clinic in 1 week to discuss the findings and to review the recommendations

## 2016-10-07 ENCOUNTER — Other Ambulatory Visit: Payer: Self-pay | Admitting: Hematology and Oncology

## 2016-10-07 DIAGNOSIS — I82411 Acute embolism and thrombosis of right femoral vein: Secondary | ICD-10-CM

## 2016-10-07 DIAGNOSIS — I2699 Other pulmonary embolism without acute cor pulmonale: Secondary | ICD-10-CM

## 2016-10-08 ENCOUNTER — Other Ambulatory Visit: Payer: Self-pay

## 2016-10-08 ENCOUNTER — Ambulatory Visit (HOSPITAL_BASED_OUTPATIENT_CLINIC_OR_DEPARTMENT_OTHER): Payer: Medicare Other | Admitting: Hematology and Oncology

## 2016-10-08 ENCOUNTER — Telehealth: Payer: Self-pay

## 2016-10-08 VITALS — BP 177/89 | HR 61 | Temp 98.0°F | Resp 18 | Ht 68.0 in | Wt 182.8 lb

## 2016-10-08 DIAGNOSIS — I2699 Other pulmonary embolism without acute cor pulmonale: Secondary | ICD-10-CM

## 2016-10-08 DIAGNOSIS — I82411 Acute embolism and thrombosis of right femoral vein: Secondary | ICD-10-CM

## 2016-10-08 NOTE — Telephone Encounter (Signed)
Pt arrived for appt today. Intended to f/u regarding ultrasound of RLE. Not yet completed. Contacted Vascular [facility] and order unable to be viewed (was not in their cue). Doctor re-entered the order. Confirmed over the phone that it was able to be viewed. Pt scheduled for 0900 with vascular and 1520 appt with Dr. Lebron Conners tomorrow afternoon. Today's visit will not be counted against pt. Dr. Lebron Conners did not assess or discuss anything with pt today.

## 2016-10-09 ENCOUNTER — Ambulatory Visit (HOSPITAL_COMMUNITY)
Admission: RE | Admit: 2016-10-09 | Discharge: 2016-10-09 | Disposition: A | Discharge status: Home / Self Care | Payer: Medicare Other | Source: Ambulatory Visit | Attending: Hematology and Oncology | Admitting: Hematology and Oncology

## 2016-10-09 ENCOUNTER — Ambulatory Visit (HOSPITAL_BASED_OUTPATIENT_CLINIC_OR_DEPARTMENT_OTHER): Payer: Medicare Other | Admitting: Hematology and Oncology

## 2016-10-09 VITALS — BP 181/88 | HR 66 | Temp 98.6°F | Resp 18 | Ht 68.0 in | Wt 183.8 lb

## 2016-10-09 DIAGNOSIS — I82411 Acute embolism and thrombosis of right femoral vein: Secondary | ICD-10-CM

## 2016-10-09 DIAGNOSIS — I2699 Other pulmonary embolism without acute cor pulmonale: Secondary | ICD-10-CM | POA: Diagnosis not present

## 2016-10-09 NOTE — Progress Notes (Signed)
**  Preliminary report by tech**  Right lower extremity venous duplex complete. There is no evidence of deep or superficial vein thrombosis involving the right lower extremity. All visualized vessels appear patent and compressible. There is no evidence of a Baker's cyst on the right. Results were faxed to Dr. Lebron Conners.   10/09/16 9:29 AM Carlos Levering RVT

## 2016-10-10 ENCOUNTER — Encounter: Payer: Self-pay | Admitting: Physician Assistant

## 2016-10-10 ENCOUNTER — Ambulatory Visit (INDEPENDENT_AMBULATORY_CARE_PROVIDER_SITE_OTHER): Payer: Medicare Other | Admitting: Physician Assistant

## 2016-10-10 ENCOUNTER — Ambulatory Visit: Payer: Medicare Other | Admitting: Urgent Care

## 2016-10-10 VITALS — BP 113/74 | HR 78 | Temp 98.1°F | Resp 18 | Ht 68.0 in | Wt 183.4 lb

## 2016-10-10 DIAGNOSIS — R6889 Other general symptoms and signs: Secondary | ICD-10-CM | POA: Diagnosis not present

## 2016-10-10 NOTE — Progress Notes (Signed)
10/11/2016 12:11 PM   DOB: 08/14/1946 / MRN: [id number]  SUBJECTIVE:  Tammy [name] is a 71 y.o. female presenting for elevated BP.  Is not prescribed antihypertensives. Last ekg without hypertropy.  Last creatinine normal. History of dementia and sees Dr. Brigitte [name]. BP taken yesterday by an automated cuff and was not rechecked.  Patient's husband instigated BP recheck here.  Patient tells me she feels well, denies HA, vision changes, SOB, new DOE and chest pain.   She is allergic to sulfa antibiotics.   She  has a past medical history of Alzheimer's disease (12/28/2014); Headache disorder (12/28/2014); and Memory disturbance (12/28/2014).    She  reports that she has never smoked. She has never used smokeless tobacco. She reports that she does not drink alcohol or use drugs. She  reports that she currently engages in sexual activity. The patient  has no past surgical history on file.  Her family history includes Dementia in her mother; Heart attack in her mother; Heart disease in her brother.  Review of Systems  Constitutional: Negative for chills, diaphoresis and fever.  Eyes: Negative.   Respiratory: Negative for cough, hemoptysis, sputum production, shortness of breath and wheezing.   Cardiovascular: Negative for chest pain, orthopnea and leg swelling.  Gastrointestinal: Negative for abdominal pain, blood in stool, constipation, diarrhea, heartburn, melena, nausea and vomiting.  Genitourinary: Negative for flank pain.  Skin: Negative for rash.  Neurological: Negative for dizziness, sensory change, speech change, focal weakness and headaches.    Per HPI  The problem list and medications were reviewed and updated by myself where necessary and exist elsewhere in the encounter.   OBJECTIVE:  BP 113/74 (BP Location: Right Arm, Patient Position: Sitting, Cuff Size: Normal)   [name] 78   Temp 98.1 F (36.7 C) (Oral)   Resp 18   Ht 5\' 8"  (1.727 m)   Wt 183 lb 6.4 oz (83.2 kg)   SpO2 98%    BMI 27.89 kg/m   Physical Exam  Constitutional: She is oriented to person, place, and time. She is active.  Non-toxic appearance.  Eyes: Pupils are equal, round, and reactive to light. EOM are normal.  Cardiovascular: Normal rate, regular rhythm, S1 normal, S2 normal, normal heart sounds and intact distal pulses.  Exam reveals no gallop, no friction rub and no decreased pulses.   No murmur heard. Pulmonary/Chest: Effort normal. No stridor. No tachypnea. No respiratory distress. She has no wheezes. She has no rales.  Abdominal: She exhibits no distension.  Musculoskeletal: She exhibits no edema.  Neurological: She is alert and oriented to person, place, and time. She has normal strength and normal reflexes. She is not disoriented. She displays no atrophy. No cranial nerve deficit or sensory deficit. She exhibits normal muscle tone. Coordination and gait normal.  Skin: Skin is warm and dry. She is not diaphoretic. No pallor.  Psychiatric: Her behavior is normal.    BP Readings from Last 3 Encounters:  10/10/16 113/74  10/09/16 (!) 181/88  10/08/16 (!) 177/89   Lab Results  Component Value Date   WBC 4.9 08/04/2016   HGB 12.6 08/04/2016   HCT 38.8 08/04/2016   MCV 85 08/04/2016   PLT 391 (H) 08/04/2016    Lab Results  Component Value Date   CREATININE 0.78 08/04/2016   BUN 14 08/04/2016   NA 146 (H) 08/04/2016   K 3.9 08/04/2016   CL 105 08/04/2016   CO2 24 08/04/2016    Lab Results  Component  Value Date   ALT 10 08/04/2016   AST 14 08/04/2016   ALKPHOS 72 08/04/2016   BILITOT 0.3 08/04/2016    Lab Results  Component Value Date   TSH 1.040 08/04/2016    Lab Results  Component Value Date   HGBA1C 5.8 (H) 08/04/2016    Lab Results  Component Value Date   CHOL 186 06/02/2013   HDL 69 06/02/2013   LDLCALC 108 (H) 06/02/2013   TRIG 43 06/02/2013      No results found for this or any previous visit (from the past 72 hour(s)).  No results  found.  ASSESSMENT AND PLAN:  Tammy [name] was seen today for hypertension.  Diagnoses and all orders for this visit:  Labile hypertension: BP checked by me in both arms and consistent with check in measure. Husband will start an ambulatory log and will deliver to me in about 7 days.      The patient is advised to call or return to clinic if she does not see an improvement in symptoms, or to seek the care of the closest emergency department if she worsens with the above plan.   Philis Fendt, MHS, PA-C [facility] 10/11/2016 12:11 PM

## 2016-10-10 NOTE — Patient Instructions (Signed)
     IF you received an x-ray today, you will receive an invoice from [facility]. Please contact [facility] at [phone] with questions or concerns regarding your invoice.   IF you received labwork today, you will receive an invoice from [facility]. Please contact [facility] at [phone] with questions or concerns regarding your invoice.   Our billing staff will not be able to assist you with questions regarding bills from these companies.  You will be contacted with the lab results as soon as they are available. The fastest way to get your results is to activate your My Chart account. Instructions are located on the last page of this paperwork. If you have not heard from us regarding the results in 2 weeks, please contact this office.     

## 2016-10-14 NOTE — Progress Notes (Signed)
Patient presented to the clinic prior to obtaining the workup requested that the last visit to the clinic. Patient has to return to the clinic once initial workup is accomplished.

## 2016-10-16 NOTE — Assessment & Plan Note (Signed)
71 year old female who has developed right lower extremity deep vein thrombosis complicated by bilateral pulmonary emboli without right heart strain in February 2018 following a road trip. Subsequently, patient has been on therapeutic anticoagulation with Rivaroxaban (Xarelto) with complete resolution of the symptoms.  History of presentation is consistent with a provoked thromboembolism. Repeat Doppler of the right lower extremity demonstrates no residual thrombosis and good recovery of the penis flow. It would be reasonable to discontinue therapeutic anticoagulation that places patient at persistent risk of excessive bleeding.   Plan: --OK to hold therapeutic anticoagulation at this time. --Careful thromboprophylaxis with any planned periods of decreased ambulation. --RTC with Hematology PRN

## 2016-10-16 NOTE — Progress Notes (Signed)
[facility] Follow-up Visit:  Assessment: Right femoral vein DVT [facility]) 71 year old female who has developed right lower extremity deep vein thrombosis complicated by bilateral pulmonary emboli without right heart strain in February 2018 following a road trip. Subsequently, patient has been on therapeutic anticoagulation with Rivaroxaban (Xarelto) with complete resolution of the symptoms.  History of presentation is consistent with a provoked thromboembolism. Repeat Doppler of the right lower extremity demonstrates no residual thrombosis and good recovery of the penis flow. It would be reasonable to discontinue therapeutic anticoagulation that places patient at persistent risk of excessive bleeding.   Plan: --OK to hold therapeutic anticoagulation at this time. --Careful thromboprophylaxis with any planned periods of decreased ambulation. --RTC with Hematology PRN  Voice recognition software was used and creation of this note. Despite my best effort at editing the text, some misspelling/errors may have occurred.  No orders of the defined types were placed in this encounter.   All questions were answered.  . The patient knows to call the clinic with any problems, questions or concerns.  This note was electronically signed.    History of Presenting Illness Tammy [name] is a 71 y.o. African-American female followed in the [place] for advice regarding possible discontinuation of therapeutic anticoagulation for previous history of deep vein thrombosis and pulmonary embolism. Patient had an episode of pulmonary embolism that was diagnosed in February 2018 the patient presented with chest discomfort. She was found to have multiple bilateral pulmonary emboli as well as deep vein thrombosis in the right lower extremity. Patient had a road trip earlier that month. She was treated with Rivaroxaban (Xarelto) with rapid resolution of swelling in the lower extremities as well as  complete resolution of the chest symptoms. At this time, patient reports feeling quite well without any additional complaints.  Since the last visit to the clinic, patient underwent repeat ultrasound Dopplers of her lower extremities. Repeat ultrasound demonstrates no evidence of residual clot. Patient denies any new symptoms since last visit to the clinic.  Medical History: Past Medical History:  Diagnosis Date  . Alzheimer's disease 12/28/2014  . Headache disorder 12/28/2014  . Memory disturbance 12/28/2014    Surgical History: No past surgical history on file.  Family History: Family History  Problem Relation Age of Onset  . Heart disease Brother   . Heart attack Mother   . Dementia Mother     Social History: Social History   Social History  . Marital status: Married    Spouse name: [name]  . Number of children: 3  . Years of education: 10   Occupational History  . retired    Social History Main Topics  . Smoking status: Never Smoker  . Smokeless tobacco: Never Used  . Alcohol use No  . Drug use: No  . Sexual activity: Yes   Other Topics Concern  . Not on file   Social History Narrative   Married with 3 children   Right handed   8th grade   1 cup coffee daily    Allergies: Allergies  Allergen Reactions  . Sulfa Antibiotics Palpitations    Medications:  Current Outpatient Prescriptions  Medication Sig Dispense Refill  . docusate sodium (COLACE) 100 MG capsule Take 1 capsule (100 mg total) by mouth daily. 30 capsule 11  . donepezil (ARICEPT) 10 MG tablet Take 1 tablet (10 mg total) by mouth at bedtime. 30 tablet 5  . mirtazapine (REMERON) 15 MG tablet TAKE 1 TABLET BY MOUTH AT  BEDTIME 30 tablet 5  . rivaroxaban (XARELTO) 20 MG TABS tablet Take 1 tablet (20 mg total) by mouth daily with supper. (Patient not taking: Reported on 10/10/2016) 30 tablet 4   No current facility-administered medications for this visit.     Review of Systems: Review of  Systems  Respiratory: Negative for shortness of breath.   Cardiovascular: Negative for leg swelling.  Neurological: Negative for extremity weakness.  All other systems reviewed and are negative.    PHYSICAL EXAMINATION Blood pressure (!) 181/88, pulse 66, temperature 98.6 F (37 C), temperature source Oral, resp. rate 18, height 5\' 8"  (1.727 m), weight 183 lb 12.8 oz (83.4 kg), SpO2 100 %.  ECOG PERFORMANCE STATUS: 1 - Symptomatic but completely ambulatory  Physical Exam  Constitutional: She is oriented to person, place, and time and well-developed, well-nourished, and in no distress.  HENT:  Head: Normocephalic and atraumatic.  Mouth/Throat: No oropharyngeal exudate.  Eyes: Pupils are equal, round, and reactive to light. Conjunctivae and EOM are normal. No scleral icterus.  Neck: Normal range of motion. No thyromegaly present.  Cardiovascular: Normal rate and regular rhythm.   No murmur heard. Pulmonary/Chest: Effort normal and breath sounds normal. No respiratory distress. She has no wheezes.  Abdominal: Soft. Bowel sounds are normal. She exhibits no mass. There is no tenderness. There is no rebound.  Musculoskeletal: Normal range of motion. She exhibits no edema.  Lymphadenopathy:    She has no cervical adenopathy.  Neurological: She is alert and oriented to person, place, and time. She has normal reflexes. No cranial nerve deficit.  Skin: She is not diaphoretic.  Vitals reviewed.    LABORATORY DATA: I have personally reviewed the data as listed: No visits with results within 1 Week(s) from this visit.  Latest known visit with results is:  Office Visit on 08/04/2016  Component Date Value Ref Range Status  . HCV Ab 08/04/2016 <0.1  0.0 - 0.9 s/co ratio Final  . WBC 08/04/2016 4.9  3.4 - 10.8 x10E3/uL Final  . RBC 08/04/2016 4.58  3.77 - 5.28 x10E6/uL Final  . Hemoglobin 08/04/2016 12.6  11.1 - 15.9 g/dL Final  . Hematocrit 08/04/2016 38.8  34.0 - 46.6 % Final  . MCV  08/04/2016 85  79 - 97 fL Final  . MCH 08/04/2016 27.5  26.6 - 33.0 pg Final  . MCHC 08/04/2016 32.5  31.5 - 35.7 g/dL Final  . RDW 08/04/2016 14.5  12.3 - 15.4 % Final  . Platelets 08/04/2016 391* 150 - 379 x10E3/uL Final  . Neutrophils 08/04/2016 57  Not Estab. % Final  . Lymphs 08/04/2016 36  Not Estab. % Final  . Monocytes 08/04/2016 5  Not Estab. % Final  . Eos 08/04/2016 1  Not Estab. % Final  . Basos 08/04/2016 1  Not Estab. % Final  . Neutrophils Absolute 08/04/2016 2.8  1.4 - 7.0 x10E3/uL Final  . Lymphocytes Absolute 08/04/2016 1.8  0.7 - 3.1 x10E3/uL Final  . Monocytes Absolute 08/04/2016 0.3  0.1 - 0.9 x10E3/uL Final  . EOS (ABSOLUTE) 08/04/2016 0.1  0.0 - 0.4 x10E3/uL Final  . Basophils Absolute 08/04/2016 0.1  0.0 - 0.2 x10E3/uL Final  . Immature Granulocytes 08/04/2016 0  Not Estab. % Final  . Immature Grans (Abs) 08/04/2016 0.0  0.0 - 0.1 x10E3/uL Final  . Glucose 08/04/2016 112* 65 - 99 mg/dL Final  . BUN 08/04/2016 14  8 - 27 mg/dL Final  . Creatinine, Ser 08/04/2016 0.78  0.57 - 1.00 mg/dL  Final  . GFR calc non Af Amer 08/04/2016 78  >59 mL/min/1.73 Final  . GFR calc Af Amer 08/04/2016 90  >59 mL/min/1.73 Final  . BUN/Creatinine Ratio 08/04/2016 18  12 - 28 Final  . Sodium 08/04/2016 146* 134 - 144 mmol/L Final  . Potassium 08/04/2016 3.9  3.5 - 5.2 mmol/L Final  . Chloride 08/04/2016 105  96 - 106 mmol/L Final  . CO2 08/04/2016 24  20 - 29 mmol/L Final  . Calcium 08/04/2016 9.8  8.7 - 10.3 mg/dL Final  . Total Protein 08/04/2016 7.8  6.0 - 8.5 g/dL Final  . Albumin 08/04/2016 4.3  3.6 - 4.8 g/dL Final  . Globulin, Total 08/04/2016 3.5  1.5 - 4.5 g/dL Final  . Albumin/Globulin Ratio 08/04/2016 1.2  1.2 - 2.2 Final  . Bilirubin Total 08/04/2016 0.3  0.0 - 1.2 mg/dL Final  . Alkaline Phosphatase 08/04/2016 72  39 - 117 IU/L Final  . AST 08/04/2016 14  0 - 40 IU/L Final  . ALT 08/04/2016 10  0 - 32 IU/L Final  . TSH 08/04/2016 1.040  0.450 - 4.500 uIU/mL Final  .  Hgb A1c MFr Bld 08/04/2016 5.8* 4.8 - 5.6 % Final   Comment:          Pre-diabetes: 5.7 - 6.4          Diabetes: >6.4          Glycemic control for adults with diabetes: <7.0   . Est. average glucose Bld gHb Est-m* 08/04/2016 120  mg/dL Final  . Vit D, 25-Hydroxy 08/04/2016 17.2* 30.0 - 100.0 ng/mL Final   Comment: Vitamin D deficiency has been defined by the Institute of Medicine and an Endocrine Society practice guideline as a level of serum 25-OH vitamin D less than 20 ng/mL (1,2). The Endocrine Society went on to further define vitamin D insufficiency as a level between 21 and 29 ng/mL (2). 1. IOM (Institute of Medicine). 2010. Dietary reference    intakes for calcium and D. [place]: The    [employer]. 2. Holick MF, Binkley [place], Bischoff-Ferrari HA, et al.    Evaluation, treatment, and prevention of vitamin D    deficiency: an Endocrine Society clinical practice    guideline. JCEM. 2011 Jul; 96(7):1911-30.   Marland Kitchen HCV interp 1: 08/04/2016 Comment   Final   Comment: Negative Not infected with HCV, unless recent infection is suspected or other evidence exists to indicate HCV infection.        Ardath Sax, MD

## 2016-11-09 ENCOUNTER — Encounter (HOSPITAL_COMMUNITY): Payer: Self-pay | Admitting: Nurse Practitioner

## 2016-11-09 ENCOUNTER — Emergency Department (HOSPITAL_COMMUNITY): Payer: Medicare Other

## 2016-11-09 ENCOUNTER — Emergency Department (HOSPITAL_COMMUNITY)
Admission: EM | Admit: 2016-11-09 | Discharge: 2016-11-09 | Disposition: A | Discharge status: Home / Self Care | Payer: Medicare Other | Attending: Emergency Medicine | Admitting: Emergency Medicine

## 2016-11-09 DIAGNOSIS — G309 Alzheimer's disease, unspecified: Secondary | ICD-10-CM | POA: Insufficient documentation

## 2016-11-09 DIAGNOSIS — N839 Noninflammatory disorder of ovary, fallopian tube and broad ligament, unspecified: Secondary | ICD-10-CM | POA: Diagnosis not present

## 2016-11-09 DIAGNOSIS — Z79899 Other long term (current) drug therapy: Secondary | ICD-10-CM | POA: Diagnosis not present

## 2016-11-09 DIAGNOSIS — N23 Unspecified renal colic: Secondary | ICD-10-CM | POA: Diagnosis not present

## 2016-11-09 DIAGNOSIS — Z7901 Long term (current) use of anticoagulants: Secondary | ICD-10-CM | POA: Diagnosis not present

## 2016-11-09 DIAGNOSIS — N838 Other noninflammatory disorders of ovary, fallopian tube and broad ligament: Secondary | ICD-10-CM

## 2016-11-09 DIAGNOSIS — R109 Unspecified abdominal pain: Secondary | ICD-10-CM | POA: Diagnosis present

## 2016-11-09 LAB — COMPREHENSIVE METABOLIC PANEL
ALK PHOS: 62 U/L (ref 38–126)
ALT: 10 U/L — ABNORMAL LOW (ref 14–54)
ANION GAP: 13 (ref 5–15)
AST: 18 U/L (ref 15–41)
Albumin: 4.6 g/dL (ref 3.5–5.0)
BILIRUBIN TOTAL: 0.6 mg/dL (ref 0.3–1.2)
BUN: 17 mg/dL (ref 6–20)
CHLORIDE: 106 mmol/L (ref 101–111)
CO2: 25 mmol/L (ref 22–32)
CREATININE: 0.99 mg/dL (ref 0.44–1.00)
Calcium: 9.6 mg/dL (ref 8.9–10.3)
GFR calc Af Amer: >60 mL/min (ref >60)
GFR calc non Af Amer: 57 mL/min — ABNORMAL LOW (ref >60)
Glucose, Bld: 117 mg/dL — ABNORMAL HIGH (ref 65–99)
Potassium: 3.5 mmol/L (ref 3.5–5.1)
Sodium: 144 mmol/L (ref 135–145)
Total Protein: 8.6 g/dL — ABNORMAL HIGH (ref 6.5–8.1)

## 2016-11-09 LAB — CBC
HCT: 37.8 % (ref 36.0–46.0)
Hemoglobin: 12.5 g/dL (ref 12.0–15.0)
MCH: 28.5 pg (ref 26.0–34.0)
MCHC: 33.1 g/dL (ref 30.0–36.0)
MCV: 86.1 fL (ref 78.0–100.0)
PLATELETS: 267 10*3/uL (ref 150–400)
RBC: 4.39 MIL/uL (ref 3.87–5.11)
RDW: 14 % (ref 11.5–15.5)
WBC: 8 10*3/uL (ref 4.0–10.5)

## 2016-11-09 LAB — URINALYSIS, ROUTINE W REFLEX MICROSCOPIC
Bilirubin Urine: NEGATIVE
Glucose, UA: NEGATIVE mg/dL
KETONES UR: 80 mg/dL — AB
Nitrite: NEGATIVE
PH: 5 (ref 5.0–8.0)
PROTEIN: 30 mg/dL — AB
Specific Gravity, Urine: 1.024 (ref 1.005–1.030)

## 2016-11-09 LAB — LIPASE, BLOOD: Lipase: 35 U/L (ref 11–51)

## 2016-11-09 MED ORDER — IOPAMIDOL (ISOVUE-300) INJECTION 61%
INTRAVENOUS | Status: AC
  Administered 2016-11-09: 100 mL via INTRAVENOUS
  Filled 2016-11-09: qty 100

## 2016-11-09 MED ORDER — HYDROCODONE-ACETAMINOPHEN 5-325 MG PO TABS: 1.0000 | ORAL_TABLET | ORAL | 0 refills | Status: DC | PRN

## 2016-11-09 NOTE — ED Triage Notes (Signed)
Pt and family is c/o LLQ abdominal pain that started a couple of hours ago. Reports last BM as this morning, denies fever or chills, N/V/D/C.

## 2016-11-09 NOTE — ED Provider Notes (Signed)
[place] DEPT Provider Note   CSN: [id number] Arrival date & time: 11/09/16  1520     History   Chief Complaint Chief Complaint  Patient presents with  . Abdominal Pain    HPI Tammy [name] is a 71 y.o. female.  HPI History limited due to dementia.  Presents with her husband for abdominal pain.  Husband states she began complaining of abdominal pain earlier this afternoon and not wanting to eat.  No vomiting or diarrhea.  Last bowel movement was this morning.  No definite fevers or chills.  Patient is on Xarelto for PE.  Previous diagnosis of right ovarian mass thought not to be malignant. Past Medical History:  Diagnosis Date  . Alzheimer's disease 12/28/2014  . Headache disorder 12/28/2014  . Memory disturbance 12/28/2014    Patient Active Problem List   Diagnosis Date Noted  . Pelvic mass in female 03/11/2016  . 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 Ovarian mass 02/24/2016  . Pulmonary embolus ([place]) 02/23/2016  . Vitamin D deficiency 01/02/2015  . Prediabetes 01/02/2015  . Memory disturbance 12/28/2014  . Headache disorder 12/28/2014  . Alzheimer's disease 12/28/2014    History reviewed. No pertinent surgical history.  OB History    Gravida Para Term Preterm AB Living   3       0 3   SAB TAB Ectopic Multiple Live Births       0           Home Medications    Prior to Admission medications   Medication Sig Start Date End Date Taking? Authorizing Provider  docusate sodium (COLACE) 100 MG capsule Take 1 capsule (100 mg total) by mouth daily. 02/28/16  Yes [name], MD  donepezil (ARICEPT) 10 MG tablet Take 1 tablet (10 mg total) by mouth at bedtime. 08/04/16  Yes [name], MD  mirtazapine (REMERON) 15 MG tablet TAKE 1 TABLET BY MOUTH AT BEDTIME 08/15/16  Yes [name], MD  HYDROcodone-acetaminophen (NORCO) 5-325 MG tablet Take 1-2 tablets by mouth every 4 (four) hours as needed. 11/09/16   Julianne Rice, MD    rivaroxaban (XARELTO) 20 MG TABS tablet Take 1 tablet (20 mg total) by mouth daily with supper. Patient not taking: Reported on 10/10/2016 03/27/16   [name], MD    Family History Family History  Problem Relation Age of Onset  . Heart disease Brother   . Heart attack Mother   . Dementia Mother     Social History Social History  Substance Use Topics  . Smoking status: Never Smoker  . Smokeless tobacco: Never Used  . Alcohol use No     Allergies   Sulfa antibiotics   Review of Systems Review of Systems  Constitutional: Negative for chills and fever.  Respiratory: Negative for shortness of breath.   Cardiovascular: Negative for chest pain.  Gastrointestinal: Positive for abdominal pain. Negative for constipation, diarrhea, nausea and vomiting.  Genitourinary: Negative for dysuria, flank pain and frequency.  Musculoskeletal: Negative for back pain, myalgias, neck pain and neck stiffness.  Skin: Negative for rash and wound.  Neurological: Negative for dizziness, weakness, light-headedness, numbness and headaches.  Psychiatric/Behavioral: Positive for confusion.  All other systems reviewed and are negative.    Physical Exam Updated Vital Signs BP (!) 176/96 (BP Location: Right Arm)   Pulse 83   Temp 97.7 F (36.5 C) (Oral)   Resp 18   SpO2 99%   Physical Exam  Constitutional: She appears well-developed and well-nourished. No distress.  HENT:  Head: Normocephalic and atraumatic.  Mouth/Throat: Oropharynx is clear and moist. No oropharyngeal exudate.  Eyes: Pupils are equal, round, and reactive to light. EOM are normal.  Neck: Normal range of motion. Neck supple.  Cardiovascular: Normal rate and regular rhythm.  Exam reveals no gallop and no friction rub.   No murmur heard. Pulmonary/Chest: Effort normal and breath sounds normal. No respiratory distress. She has no wheezes. She has no rales. She exhibits no tenderness.  Abdominal: Soft. Bowel sounds are normal.  There is tenderness. There is no rebound and no guarding.  Epigastric and right lower quadrant tenderness to palpation, though whole abdomen is mildly tender.  No rebound or guarding.  Musculoskeletal: Normal range of motion. She exhibits no edema or tenderness.  Neurological: She is alert.  Oriented to person.  Moving all extremities with deficit.  Sensation intact.  Ambulates with minimal assistance.  Skin: Skin is warm and dry. No rash noted. She is not diaphoretic. No erythema.  Psychiatric: She has a normal mood and affect.  Blunted affect.  Mask-like facies  Nursing note and vitals reviewed.    ED Treatments / Results  Labs (all labs ordered are listed, but only abnormal results are displayed) Labs Reviewed  COMPREHENSIVE METABOLIC PANEL - Abnormal; Notable for the following:       Result Value   Glucose, Bld 117 (*)    Total Protein 8.6 (*)    ALT 10 (*)    GFR calc non Af Amer 57 (*)    All other components within normal limits  URINALYSIS, ROUTINE W REFLEX MICROSCOPIC - Abnormal; Notable for the following:    Color, Urine AMBER (*)    Hgb urine dipstick MODERATE (*)    Ketones, ur 80 (*)    Protein, ur 30 (*)    Leukocytes, UA MODERATE (*)    Bacteria, UA RARE (*)    Squamous Epithelial / LPF 6-30 (*)    All other components within normal limits  URINE CULTURE  LIPASE, BLOOD  CBC    EKG  EKG Interpretation None       Radiology Ct Abdomen Pelvis W Contrast  Result Date: 11/09/2016 CLINICAL DATA:  71 year old female with left lower quadrant abdominal pain. EXAM: CT ABDOMEN AND PELVIS WITH CONTRAST TECHNIQUE: Multidetector CT imaging of the abdomen and pelvis was performed using the standard protocol following bolus administration of intravenous contrast. CONTRAST:  ISOVUE-300 IOPAMIDOL (ISOVUE-300) INJECTION 61% COMPARISON:  Abdominal CT dated 02/23/2016 FINDINGS: Lower chest: Minimal bibasilar dependent atelectatic changes. The visualized lung bases are  otherwise clear. No intra-abdominal free air or free fluid. Hepatobiliary: There multiple hepatic hypodense lesions similar to prior CT demonstrating fluid attenuation most consistent with cysts. The largest lesion measures up to 3 cm in the posterior right lobe of the liver (segment VII). No intrahepatic biliary ductal dilatation. The gallbladder is unremarkable. Pancreas: Unremarkable. No pancreatic ductal dilatation or surrounding inflammatory changes. Spleen: Normal in size without focal abnormality. Adrenals/Urinary Tract: The adrenal glands are unremarkable. There is a 5 mm stone at the left ureterovesical junction with mild left hydronephrosis. The right kidney, right ureter appear unremarkable. There is delayed enhancement and excretion of contrast by the left kidney. Stomach/Bowel: There is sigmoid diverticulosis without active inflammatory changes. No evidence of bowel obstruction or active inflammation. The appendix is normal. Vascular/Lymphatic: The abdominal aorta and IVC appear unremarkable. The SMV, splenic vein, and main portal vein are patent. No portal  venous gas identified. There is no adenopathy. Reproductive: The uterus is grossly unremarkable. 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 This lesion measures approximately 6.5 x 4.5 cm pole and appears slightly larger compared to the prior CT. This lesion can predispose the right ovary to an increased risk of torsion. Further evaluation with pelvic MRI without and with contrast is recommended to exclude malignant transformation. The left ovary is unremarkable. Other: None Musculoskeletal: No acute or significant osseous findings. IMPRESSION: 1. A 5 mm left UVJ stone with mild left hydronephrosis. 2. 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 Interval increase in the size of this lesion compared to the prior CT. Further evaluation with MRI without and with contrast on a  nonemergent/outpatient basis recommended to exclude malignant transformation. Please note this lesion predisposes with the right ovary to an increased incidence of torsion. 3. Colonic diverticulosis. No bowel obstruction or active inflammation. Normal appendix. 4. Multiple hepatic cysts. Electronically Signed   By: Anner Crete M.D.   On: 11/09/2016 22:40    Procedures Procedures (including critical care time)  Medications Ordered in ED Medications  iopamidol (ISOVUE-300) 61 % injection (100 mLs Intravenous Contrast Given 11/09/16 2148)     Initial Impression / Assessment and Plan / ED Course  I have reviewed the triage vital signs and the nursing notes.  Pertinent labs & imaging results that were available during my care of the patient were reviewed by me and considered in my medical decision making (see chart for details).     Patient without urinary symptoms.  No fever.  Normal white blood cell count.  Urine sample is contaminated.  Will send for culture.  Patient does have a left-sided ureteral stone.  Mild hydronephrosis.  Low suspicion for infected stone.  Will treat symptomatically and have follow-up with urology.  Very strict return precautions have been given to both patient and her husband.  Final Clinical Impressions(s) / ED Diagnoses   Final diagnoses:  Renal colic on left side  Ovarian mass, right    New Prescriptions New Prescriptions   HYDROCODONE-ACETAMINOPHEN (NORCO) 5-325 MG TABLET    Take 1-2 tablets by mouth every 4 (four) hours as needed.     Julianne Rice, MD 11/09/16 2259

## 2016-11-09 NOTE — ED Notes (Signed)
Bed: WA03 Expected date:  Expected time:  Means of arrival:  Comments: 

## 2016-11-11 LAB — URINE CULTURE: Culture: <10000 — AB

## 2016-11-18 NOTE — Progress Notes (Deleted)
   Subjective:    Patient ID: Tammy [name], female    DOB: [birth year], 71 y.o.   MRN: [id number]  HPI ER visit 10/28 for several hours of abd pain and anorexia.  11/09/16 CT abd pelvis w/ contrast: 5 mm stone at the left ureterovesical junction with mild left Hydronephrosis. Liver cysts unchanges, sigmoid diverticulosis w/o inflammation. 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 - 6.5 x 4.5 cm - which increases risk for Rt ovarian torsion. Radiology rec further eval w/ pelvic MRI w/ and w/o contrast to exclude malignant transformation but pt was seen by gyn Dr. Toney Rakes and gyn onc Dr. 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  Discharged home w/ hydrocodone 5mg  #20 and instructed to f/u with urology  right lower extremity deep vein thrombosis complicated by bilateral pulmonary emboli without right heart strain in February 23, 2016 following a road trip. Has been on xarelto for PE since 02/2016. Sxs resolved, repeat doppler in [facility] showed no residual thrombosis and good venous flow so last mo hematology Dr. Lebron Conners recommended stopping xarelto as clot was provoked and pt is at risk of bleeding due to dementia. Advised thromboprophylasix w/ any planned periods of decreased ambulation.  Alzheimer's disease -Progressive, early onset, following with [facility] Dr. Jannifer Franklin. Causes medication non-compliance as freq refuses to take pills and husband does not like to force her when she is being argumentative. Nml vit B12, tsh, UClx 02/2016.     Objective:   Physical Exam        Assessment & Plan:  Cons cmp - last w/ slightly elev protein Cons KUB Need pelvic exam? Not done in ER

## 2016-11-19 ENCOUNTER — Encounter: Payer: Self-pay | Admitting: Family Medicine

## 2016-11-19 ENCOUNTER — Ambulatory Visit: Payer: Medicare Other | Admitting: Family Medicine

## 2016-11-19 ENCOUNTER — Ambulatory Visit (INDEPENDENT_AMBULATORY_CARE_PROVIDER_SITE_OTHER): Payer: Medicare Other

## 2016-11-19 VITALS — BP 122/72 | HR 72 | Temp 98.7°F | Resp 17 | Ht 66.0 in | Wt 179.0 lb

## 2016-11-19 DIAGNOSIS — R3129 Other microscopic hematuria: Secondary | ICD-10-CM

## 2016-11-19 DIAGNOSIS — R1030 Lower abdominal pain, unspecified: Secondary | ICD-10-CM

## 2016-11-19 DIAGNOSIS — N200 Calculus of kidney: Secondary | ICD-10-CM | POA: Diagnosis not present

## 2016-11-19 LAB — POCT CBC
Granulocyte percent: 50.9 %G (ref 37–80)
HEMATOCRIT: 35.3 % — AB (ref 37.7–47.9)
HEMOGLOBIN: 11.5 g/dL — AB (ref 12.2–16.2)
LYMPH, POC: 2.1 (ref 0.6–3.4)
MCH, POC: 27.7 pg (ref 27–31.2)
MCHC: 32.7 g/dL (ref 31.8–35.4)
MCV: 84.8 fL (ref 80–97)
MID (CBC): 0.3 (ref 0–0.9)
MPV: 7.8 fL (ref 0–99.8)
POC GRANULOCYTE: 2.5 (ref 2–6.9)
POC LYMPH %: 42.1 % (ref 10–50)
POC MID %: 7 % (ref 0–12)
Platelet Count, POC: 287 10*3/uL (ref 142–424)
RBC: 4.16 M/uL (ref 4.04–5.48)
RDW, POC: 13.9 %
WBC: 4.9 10*3/uL (ref 4.6–10.2)

## 2016-11-19 LAB — POCT URINALYSIS DIP (MANUAL ENTRY)
BILIRUBIN UA: NEGATIVE
BILIRUBIN UA: NEGATIVE mg/dL
Glucose, UA: NEGATIVE mg/dL
Nitrite, UA: NEGATIVE
PH UA: 5.5 (ref 5.0–8.0)
Spec Grav, UA: >=1.03 — AB (ref 1.010–1.025)
Urobilinogen, UA: 0.2 E.U./dL

## 2016-11-19 LAB — POC MICROSCOPIC URINALYSIS (UMFC): Mucus: ABSENT

## 2016-11-19 MED ORDER — TAMSULOSIN HCL 0.4 MG PO CAPS: 0.4000 mg | ORAL_CAPSULE | Freq: Every day | ORAL | 0 refills | Status: DC

## 2016-11-19 NOTE — Patient Instructions (Addendum)
     IF you received an x-ray today, you will receive an invoice from [facility]. Please contact [facility] at [phone] with questions or concerns regarding your invoice.   IF you received labwork today, you will receive an invoice from [facility]. Please contact [facility] at [phone] with questions or concerns regarding your invoice.   Our billing staff will not be able to assist you with questions regarding bills from these companies.  You will be contacted with the lab results as soon as they are available. The fastest way to get your results is to activate your My Chart account. Instructions are located on the last page of this paperwork. If you have not heard from us regarding the results in 2 weeks, please contact this office.     Kidney Stones Kidney stones (urolithiasis) are rock-like masses that form inside of the kidneys. Kidneys are organs that make pee (urine). A kidney stone can cause very bad pain and can block the flow of pee. The stone usually leaves your body (passes) through your pee. You may need to have a doctor take out the stone. Follow these instructions at home: Eating and drinking  Drink enough fluid to keep your pee clear or pale yellow. This will help you pass the stone.  If told by your doctor, change the foods you eat (your diet). This may include: ? Limiting how much salt (sodium) you eat. ? Eating more fruits and vegetables. ? Limiting how much meat, poultry, fish, and eggs you eat.  Follow instructions from your doctor about eating or drinking restrictions. General instructions  Collect pee samples as told by your doctor. You may need to collect a pee sample: ? 24 hours after a stone comes out. ? 8-12 weeks after a stone comes out, and every 6-12 months after that.  Strain your pee every time you pee (urinate), for as long as told. Use the strainer that your doctor recommends.  Do not throw out the stone. Keep it so that it  can be tested by your doctor.  Take over-the-counter and prescription medicines only as told by your doctor.  Keep all follow-up visits as told by your doctor. This is important. You may need follow-up tests. Preventing kidney stones To prevent another kidney stone:  Drink enough fluid to keep your pee clear or pale yellow. This is the best way to prevent kidney stones.  Eat healthy foods.  Avoid certain foods as told by your doctor. You may be told to eat less protein.  Stay at a healthy weight.  Contact a doctor if:  You have pain that gets worse or does not get better with medicine. Get help right away if:  You have a fever or chills.  You get very bad pain.  You get new pain in your belly (abdomen).  You pass out (faint).  You cannot pee. This information is not intended to replace advice given to you by your health care provider. Make sure you discuss any questions you have with your health care provider. Document Released: 06/18/2007 Document Revised: 09/18/2015 Document Reviewed: 09/18/2015 Elsevier Interactive Patient Education  2017 Elsevier Inc.  

## 2016-11-19 NOTE — Progress Notes (Signed)
Subjective:  By signing my name below, I, Tammy [name], attest that this documentation has been prepared under the direction and in the presence of Tammy Cheadle, MD Electronically Signed: Ladene Artist, ED Scribe 11/19/2016 at 2:21 PM.   Patient ID: Tammy [name], female    DOB: 08/14/1946, 71 y.o.   MRN: [id number]  Chief Complaint  Patient presents with  . kidney stones   HPI ER visit 10/28 for several hours of abd pain and anorexia. Pt denies abdominal pain at this time, changes to urine, dysuria, constipation. Pt's last BM was today. States she has been drinking a lot of water. Pt's husband states that pt does not complain about anything ever.  11/09/16 CT abd pelvis w/ contrast: 5 mm stone at the left ureterovesical junction with mild left Hydronephrosis. Liver cysts unchanges, sigmoid diverticulosis w/o inflammation. 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 - 6.5 x 4.5 cm - which increases risk for Rt ovarian torsion. Radiology rec further eval w/ pelvic MRI w/ and w/o contrast to exclude malignant transformation but pt was seen by gyn Dr. Toney Rakes and gyn onc Dr. 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  Discharged home w/ hydrocodone 5mg  #20 and instructed to f/u with urology  right lower extremity deep vein thrombosis complicated by bilateral pulmonary emboli without right heart strain in February 23, 2016 following a road trip. Has been on xarelto for PE since 02/2016. Sxs resolved, repeat doppler in [facility] showed no residual thrombosis and good venous flow so last mo hematology Dr. Lebron Conners recommended stopping xarelto as clot was provoked and pt is at risk of bleeding due to dementia. Advised thromboprophylasix w/ any planned periods of decreased ambulation.  Alzheimer's disease -Progressive, early onset, following with [facility] Dr.  Jannifer Franklin. Causes medication non-compliance as freq refuses to take pills and husband does not like to force her when she is being argumentative. Nml vit B12, tsh, UClx 02/2016.  Past Medical History:  Diagnosis Date  . Alzheimer's disease 12/28/2014  . Headache disorder 12/28/2014  . Memory disturbance 12/28/2014   No past surgical history on file. Current Outpatient Medications on File Prior to Visit  Medication Sig Dispense Refill  . docusate sodium (COLACE) 100 MG capsule Take 1 capsule (100 mg total) by mouth daily. 30 capsule 11  . donepezil (ARICEPT) 10 MG tablet Take 1 tablet (10 mg total) by mouth at bedtime. 30 tablet 5  . HYDROcodone-acetaminophen (NORCO) 5-325 MG tablet Take 1-2 tablets by mouth every 4 (four) hours as needed. 20 tablet 0  . mirtazapine (REMERON) 15 MG tablet TAKE 1 TABLET BY MOUTH AT BEDTIME 30 tablet 5   No current facility-administered medications on file prior to visit.    Allergies  Allergen Reactions  . Sulfa Antibiotics Palpitations   Family History  Problem Relation Age of Onset  . Heart disease Brother   . Heart attack Mother   . Dementia Mother    Social History   Socioeconomic History  . Marital status: Married    Spouse name: Tammy [name]  . Number of children: 3  . Years of education: 41  . Highest education level: None  Social Needs  . Financial resource strain: None  . Food insecurity - worry: None  . Food insecurity - inability: None  . Transportation needs - medical: None  . Transportation needs -  non-medical: None  Occupational History  . Occupation: retired  Tobacco Use  . Smoking status: Never Smoker  . Smokeless tobacco: Never Used  Substance and Sexual Activity  . Alcohol use: No  . Drug use: No  . Sexual activity: Yes  Other Topics Concern  . None  Social History Narrative   Married with 3 children   Right handed   8th grade   1 cup coffee daily   Depression screen [facility] 2/9 11/19/2016 10/10/2016 08/04/2016 03/27/2016  02/28/2016  Decreased Interest 0 0 0 0 0  Down, Depressed, Hopeless 0 0 0 0 0  PHQ - 2 Score 0 0 0 0 0    ROS: See hpi    Objective:   Physical Exam  Constitutional: She is oriented to person, place, and time. She appears well-developed and well-nourished. No distress.  HENT:  Head: Normocephalic and atraumatic.  Eyes: Conjunctivae and EOM are normal.  Neck: Neck supple. No tracheal deviation present.  Cardiovascular: Normal rate, regular rhythm, S1 normal, S2 normal and normal heart sounds.  Pulmonary/Chest: Effort normal and breath sounds normal. No respiratory distress.  Lungs clear.  Abdominal: She exhibits no mass. There is tenderness (mild) in the right lower quadrant, suprapubic area and left lower quadrant. There is no CVA tenderness.  Tympanitic bowel sounds.  Musculoskeletal: Normal range of motion.  Neurological: She is alert and oriented to person, place, and time.  Skin: Skin is warm and dry.  Psychiatric: She has a normal mood and affect. Her behavior is normal.  Nursing note and vitals reviewed.  BP 122/72   Pulse 72   Temp 98.7 F (37.1 C) (Oral)   Resp 17   Ht 5\' 6"  (1.676 m)   Wt 179 lb (81.2 kg)   SpO2 98%   BMI 28.89 kg/m     Results for orders placed or performed in visit on 11/19/16  POCT urinalysis dipstick  Result Value Ref Range   Color, UA yellow yellow   Clarity, UA clear clear   Glucose, UA negative negative mg/dL   Bilirubin, UA negative negative   Ketones, POC UA negative negative mg/dL   Spec Grav, UA >=1.030 (A) 1.010 - 1.025   Blood, UA trace-intact (A) negative   pH, UA 5.5 5.0 - 8.0   Protein Ur, POC trace (A) negative mg/dL   Urobilinogen, UA 0.2 0.2 or 1.0 E.U./dL   Nitrite, UA Negative Negative   Leukocytes, UA Small (1+) (A) Negative  POCT Microscopic Urinalysis ([facility])  Result Value Ref Range   WBC,UR,HPF,POC Many (A) None WBC/hpf   RBC,UR,HPF,POC None None RBC/hpf   Bacteria Few (A) None, Too numerous to count   Mucus  Absent Absent   Epithelial Cells, UR Per Microscopy Few (A) None, Too numerous to count cells/hpf  POCT CBC  Result Value Ref Range   WBC 4.9 4.6 - 10.2 K/uL   Lymph, poc 2.1 0.6 - 3.4   POC LYMPH PERCENT 42.1 10 - 50 %L   MID (cbc) 0.3 0 - 0.9   POC MID % 7.0 0 - 12 %M   POC Granulocyte 2.5 2 - 6.9   Granulocyte percent 50.9 37 - 80 %G   RBC 4.16 4.04 - 5.48 M/uL   Hemoglobin 11.5 (A) 12.2 - 16.2 g/dL   HCT, POC 35.3 (A) 37.7 - 47.9 %   MCV 84.8 80 - 97 fL   MCH, POC 27.7 27 - 31.2 pg   MCHC 32.7 31.8 - 35.4 g/dL  RDW, POC 13.9 %   Platelet Count, POC 287 142 - 424 K/uL   MPV 7.8 0 - 99.8 fL   Dg Abd 2 Views  Result Date: 11/19/2016 CLINICAL DATA:  Lower abdominal and suprapubic pain greater on the left than on the right. Known 5 mm left UPJ stone and mild left hydronephrosis found on CT scan of November 09, 2016 EXAM: ABDOMEN - 2 VIEW COMPARISON:  Noncontrast abdominal and pelvic CT scan of November 09, 2016 FINDINGS: The bowel gas pattern is within the limits of normal. There is a moderate amount of stool in the rectum. There are numerous pelvic phleboliths. A tiny calcification likely in the region of the left UPJ is demonstrated just inferior to the left L3 transverse process. The bony structures are unremarkable. IMPRESSION: Probable persistent proximal left ureteral stone. No acute bowel abnormality. Electronically Signed   By: David  Martinique M.D.   On: 11/19/2016 15:05   Assessment & Plan:  Cons cmp - last w/ slightly elev protein Cons KUB Need pelvic exam? Not done in ER  1. Nephrolithiasis   2. Microscopic hematuria   3. Lower abdominal pain     Orders Placed This Encounter  Procedures  . Urine Culture  . DG Abd 2 Views    Standing Status:   Future    Number of Occurrences:   1    Standing Expiration Date:   11/19/2017    Order Specific Question:   Reason for Exam (SYMPTOM  OR DIAGNOSIS REQUIRED)    Answer:   lower abd and suprapubic pain, L>R, diagnosed w/ 5 mm  stone in Left UPJ and mild left hydronephrosis on Ct sev d ago.    Order Specific Question:   Preferred imaging location?    Answer:   External  . Basic metabolic panel    Order Specific Question:   Has the patient fasted?    Answer:   No  . Hepatic Function Panel  . Hepatic function panel  . POCT urinalysis dipstick  . POCT Microscopic Urinalysis ([facility])  . POCT CBC    Meds ordered this encounter  Medications  . tamsulosin (FLOMAX) 0.4 MG CAPS capsule    Sig: Take 1 capsule (0.4 mg total) daily by mouth.    Dispense:  30 capsule    Refill:  0    I personally performed the services described in this documentation, which was scribed in my presence. The recorded information has been reviewed and considered, and addended by me as needed.   Tammy [name], M.D.  Primary Care at [facility] [address] [place], [place] [address] [phone] phone [phone] fax  11/22/16 1:13 PM

## 2016-11-20 LAB — BASIC METABOLIC PANEL
BUN / CREAT RATIO: 20 (ref 12–28)
BUN: 16 mg/dL (ref 8–27)
CO2: 26 mmol/L (ref 20–29)
CREATININE: 0.81 mg/dL (ref 0.57–1.00)
Calcium: 9.3 mg/dL (ref 8.7–10.3)
Chloride: 107 mmol/L — ABNORMAL HIGH (ref 96–106)
GFR calc Af Amer: 86 mL/min/{1.73_m2} (ref >59)
GFR, EST NON AFRICAN AMERICAN: 74 mL/min/{1.73_m2} (ref >59)
GLUCOSE: 93 mg/dL (ref 65–99)
POTASSIUM: 3.5 mmol/L (ref 3.5–5.2)
SODIUM: 146 mmol/L — AB (ref 134–144)

## 2016-11-20 LAB — HEPATIC FUNCTION PANEL
ALBUMIN: 4.4 g/dL (ref 3.6–4.8)
ALK PHOS: 63 IU/L (ref 39–117)
ALT: 10 IU/L (ref 0–32)
AST: 15 IU/L (ref 0–40)
BILIRUBIN TOTAL: 0.2 mg/dL (ref 0.0–1.2)
BILIRUBIN, DIRECT: 0.08 mg/dL (ref 0.00–0.40)
Total Protein: 7.5 g/dL (ref 6.0–8.5)

## 2016-11-20 LAB — URINE CULTURE

## 2016-12-01 ENCOUNTER — Ambulatory Visit: Payer: Medicare Other | Admitting: Physician Assistant

## 2016-12-01 ENCOUNTER — Other Ambulatory Visit: Payer: Self-pay

## 2016-12-01 ENCOUNTER — Encounter: Payer: Self-pay | Admitting: Physician Assistant

## 2016-12-01 VITALS — BP 150/92 | HR 80 | Temp 98.2°F | Resp 16 | Ht 67.0 in | Wt 177.8 lb

## 2016-12-01 DIAGNOSIS — R609 Edema, unspecified: Secondary | ICD-10-CM

## 2016-12-01 DIAGNOSIS — I10 Essential (primary) hypertension: Secondary | ICD-10-CM

## 2016-12-01 DIAGNOSIS — R600 Localized edema: Secondary | ICD-10-CM

## 2016-12-01 MED ORDER — HYDROCHLOROTHIAZIDE 25 MG PO TABS: 25.0000 mg | ORAL_TABLET | Freq: Every day | ORAL | 3 refills | Status: DC

## 2016-12-01 NOTE — Progress Notes (Signed)
   Tammy [name]  MRN: [id number] DOB: 08/14/1946  PCP: [name], MD  Subjective:  Pt is a 71 year old female PMH PE, DVT, alzheimer's, prediabetes, ovarian mass who presents to clinic for b/l swollen feet. She is here today with her husband. Blood pressure today is  150/92. Pt has a h/o dementia and is unsure how long her feet have been swollen. Is not using compression socks or elevating her feet. She is not taking blood pressure medications. Denies shob, chest pain, dyspnea, HA.   Review of Systems  Constitutional: Negative for chills, diaphoresis and fatigue.  Respiratory: Negative for cough and shortness of breath.   Cardiovascular: Positive for leg swelling. Negative for chest pain and palpitations.  Musculoskeletal: Negative for gait problem.  Psychiatric/Behavioral: The patient is not nervous/anxious.     Patient Active Problem List   Diagnosis Date Noted  . Pelvic mass in female 03/11/2016  . Right femoral vein DVT ([place]) 02/24/2016  . Ovarian mass 02/24/2016  . Pulmonary embolus ([place]) 02/23/2016  . Vitamin D deficiency 01/02/2015  . Prediabetes 01/02/2015  . Memory disturbance 12/28/2014  . Headache disorder 12/28/2014  . Alzheimer's disease 12/28/2014    Current Outpatient Medications on File Prior to Visit  Medication Sig Dispense Refill  . docusate sodium (COLACE) 100 MG capsule Take 1 capsule (100 mg total) by mouth daily. 30 capsule 11  . donepezil (ARICEPT) 10 MG tablet Take 1 tablet (10 mg total) by mouth at bedtime. 30 tablet 5  . HYDROcodone-acetaminophen (NORCO) 5-325 MG tablet Take 1-2 tablets by mouth every 4 (four) hours as needed. 20 tablet 0  . mirtazapine (REMERON) 15 MG tablet TAKE 1 TABLET BY MOUTH AT BEDTIME 30 tablet 5  . tamsulosin (FLOMAX) 0.4 MG CAPS capsule Take 1 capsule (0.4 mg total) daily by mouth. 30 capsule 0   No current facility-administered medications on file prior to visit.     Allergies  Allergen Reactions  . Sulfa Antibiotics  Palpitations     Objective:  BP (!) 150/92   Pulse 80   Temp 98.2 F (36.8 C) (Oral)   Resp 16   Ht 5\' 7"  (1.702 m)   Wt 177 lb 12.8 oz (80.6 kg)   SpO2 99%   BMI 27.85 kg/m   Physical Exam  Constitutional: She is oriented to person, place, and time and well-developed, well-nourished, and in no distress. No distress.  Cardiovascular: Normal rate, regular rhythm and normal heart sounds.  Musculoskeletal:       Right lower leg: She exhibits edema (1+ mid calf).       Left lower leg: She exhibits swelling (1+ to mid calf) and edema.       Right foot: There is swelling.       Left foot: There is swelling.  Neurological: She is alert and oriented to person, place, and time. GCS score is 15.  Skin: Skin is warm and dry.  Psychiatric: Mood, memory, affect and judgment normal.  Vitals reviewed.   Assessment and Plan :  1. Essential hypertension 2. Peripheral edema - hydrochlorothiazide (HYDRODIURIL) 25 MG tablet; Take 1 tablet (25 mg total) daily by mouth.  Dispense: 90 tablet; Refill: 3 - Plan to start HCTZ 25mg  qd. Last BMP 11/07 nml. Advised compression socks and elevation. RTC in 4-6 weeks for recheck. She understands and agrees with plan.   Mercer Pod, PA-C  Primary Care at [facility] 12/01/2016 1:49 PM

## 2016-12-01 NOTE — Patient Instructions (Addendum)
Start taking Hydrochlorothiazide (HCTZ) 57m daily. This is a blood pressure medication. This will lower your blood pressure and help the swelling in your feet. Please come back and see me in 4-6 weeks for recheck.    Wear compression socks. Elevating your feet 1-2 times a day to a level above your heart will help reduce swelling.  Thank you for coming in today. I hope you feel we met your needs.  Feel free to call PCP if you have any questions or further requests.  Please consider signing up for MyChart if you do not already have it, as this is a great way to communicate with me.  Best,  Whitney McVey, PA-C  DASH Eating Plan DASH stands for "Dietary Approaches to Stop Hypertension." The DASH eating plan is a healthy eating plan that has been shown to reduce high blood pressure (hypertension). It may also reduce your risk for type 2 diabetes, heart disease, and stroke. The DASH eating plan may also help with weight loss. What are tips for following this plan? General guidelines  Avoid eating more than 2,300 mg (milligrams) of salt (sodium) a day. If you have hypertension, you may need to reduce your sodium intake to 1,500 mg a day.  Limit alcohol intake to no more than 1 drink a day for nonpregnant women and 2 drinks a day for men. One drink equals 12 oz of beer, 5 oz of wine, or 1 oz of hard liquor.  Work with your health care provider to maintain a healthy body weight or to lose weight. Ask what an ideal weight is for you.  Get at least 30 minutes of exercise that causes your heart to beat faster (aerobic exercise) most days of the week. Activities may include walking, swimming, or biking.  Work with your health care provider or diet and nutrition specialist (dietitian) to adjust your eating plan to your individual calorie needs. Reading food labels  Check food labels for the amount of sodium per serving. Choose foods with less than 5 percent of the Daily Value of sodium. Generally,  foods with less than 300 mg of sodium per serving fit into this eating plan.  To find whole grains, look for the word "whole" as the first word in the ingredient list. Shopping  Buy products labeled as "low-sodium" or "no salt added."  Buy fresh foods. Avoid canned foods and premade or frozen meals. Cooking  Avoid adding salt when cooking. Use salt-free seasonings or herbs instead of table salt or sea salt. Check with your health care provider or pharmacist before using salt substitutes.  Do not fry foods. Cook foods using healthy methods such as baking, boiling, grilling, and broiling instead.  Cook with heart-healthy oils, such as olive, canola, soybean, or sunflower oil. Meal planning   Eat a balanced diet that includes: ? 5 or more servings of fruits and vegetables each day. At each meal, try to fill half of your plate with fruits and vegetables. ? Up to 6-8 servings of whole grains each day. ? Less than 6 oz of lean meat, poultry, or fish each day. A 3-oz serving of meat is about the same size as a deck of cards. One egg equals 1 oz. ? 2 servings of low-fat dairy each day. ? A serving of nuts, seeds, or beans 5 times each week. ? Heart-healthy fats. Healthy fats called Omega-3 fatty acids are found in foods such as flaxseeds and coldwater fish, like sardines, salmon, and mackerel.  Limit how  much you eat of the following: ? Canned or prepackaged foods. ? Food that is high in trans fat, such as fried foods. ? Food that is high in saturated fat, such as fatty meat. ? Sweets, desserts, sugary drinks, and other foods with added sugar. ? Full-fat dairy products.  Do not salt foods before eating.  Try to eat at least 2 vegetarian meals each week.  Eat more home-cooked food and less restaurant, buffet, and fast food.  When eating at a restaurant, ask that your food be prepared with less salt or no salt, if possible. What foods are recommended? The items listed may not be a  complete list. Talk with your dietitian about what dietary choices are best for you. Grains Whole-grain or whole-wheat bread. Whole-grain or whole-wheat pasta. Brown rice. [place] Morrow. Bulgur. Whole-grain and low-sodium cereals. Pita bread. Low-fat, low-sodium crackers. Whole-wheat flour tortillas. Vegetables Fresh or frozen vegetables (raw, steamed, roasted, or grilled). Low-sodium or reduced-sodium tomato and vegetable juice. Low-sodium or reduced-sodium tomato sauce and tomato paste. Low-sodium or reduced-sodium canned vegetables. Fruits All fresh, dried, or frozen fruit. Canned fruit in natural juice (without added sugar). Meat and other protein foods Skinless chicken or [place]. Ground chicken or [place]. Pork with fat trimmed off. Fish and seafood. Egg whites. Dried beans, peas, or lentils. Unsalted nuts, nut butters, and seeds. Unsalted canned beans. Lean cuts of beef with fat trimmed off. Low-sodium, lean deli meat. Dairy Low-fat (1%) or fat-free (skim) milk. Fat-free, low-fat, or reduced-fat cheeses. Nonfat, low-sodium ricotta or cottage cheese. Low-fat or nonfat yogurt. Low-fat, low-sodium cheese. Fats and oils Soft margarine without trans fats. Vegetable oil. Low-fat, reduced-fat, or light mayonnaise and salad dressings (reduced-sodium). Canola, safflower, olive, soybean, and sunflower oils. Avocado. Seasoning and other foods Herbs. Spices. Seasoning mixes without salt. Unsalted popcorn and pretzels. Fat-free sweets. What foods are not recommended? The items listed may not be a complete list. Talk with your dietitian about what dietary choices are best for you. Grains Baked goods made with fat, such as croissants, muffins, or some breads. Dry pasta or rice meal packs. Vegetables Creamed or fried vegetables. Vegetables in a cheese sauce. Regular canned vegetables (not low-sodium or reduced-sodium). Regular canned tomato sauce and paste (not low-sodium or reduced-sodium). Regular  tomato and vegetable juice (not low-sodium or reduced-sodium). Angie Fava. Olives. Fruits Canned fruit in a light or heavy syrup. Fried fruit. Fruit in cream or butter sauce. Meat and other protein foods Fatty cuts of meat. Ribs. Fried meat. Berniece Salines. Sausage. Bologna and other processed lunch meats. Salami. Fatback. Hotdogs. Bratwurst. Salted nuts and seeds. Canned beans with added salt. Canned or smoked fish. Whole eggs or egg yolks. Chicken or [place] with skin. Dairy Whole or 2% milk, cream, and half-and-half. Whole or full-fat cream cheese. Whole-fat or sweetened yogurt. Full-fat cheese. Nondairy creamers. Whipped toppings. Processed cheese and cheese spreads. Fats and oils Butter. Stick margarine. Lard. Shortening. Ghee. Bacon fat. Tropical oils, such as coconut, palm kernel, or palm oil. Seasoning and other foods Salted popcorn and pretzels. Onion salt, garlic salt, seasoned salt, table salt, and sea salt. Worcestershire sauce. Tartar sauce. Barbecue sauce. Teriyaki sauce. Soy sauce, including reduced-sodium. Steak sauce. Canned and packaged gravies. Fish sauce. Oyster sauce. Cocktail sauce. Horseradish that you find on the shelf. Ketchup. Mustard. Meat flavorings and tenderizers. Bouillon cubes. Hot sauce and Tabasco sauce. Premade or packaged marinades. Premade or packaged taco seasonings. Relishes. Regular salad dressings. Where to find more information:  National Heart, Lung, and Blood Institute: https://wilson-eaton.com/  American Heart Association: www.heart.org Summary  The DASH eating plan is a healthy eating plan that has been shown to reduce high blood pressure (hypertension). It may also reduce your risk for type 2 diabetes, heart disease, and stroke.  With the DASH eating plan, you should limit salt (sodium) intake to 2,300 mg a day. If you have hypertension, you may need to reduce your sodium intake to 1,500 mg a day.  When on the DASH eating plan, aim to eat more fresh fruits and  vegetables, whole grains, lean proteins, low-fat dairy, and heart-healthy fats.  Work with your health care provider or diet and nutrition specialist (dietitian) to adjust your eating plan to your individual calorie needs. This information is not intended to replace advice given to you by your health care provider. Make sure you discuss any questions you have with your health care provider. Document Released: 12/19/2010 Document Revised: 12/24/2015 Document Reviewed: 12/24/2015 Elsevier Interactive Patient Education  2017 [employer].   IF you received an x-ray today, you will receive an invoice from [facility]. Please contact [facility] at [phone] with questions or concerns regarding your invoice.   IF you received labwork today, you will receive an invoice from [place]. Please contact [facility] at [phone] with questions or concerns regarding your invoice.   Our billing staff will not be able to assist you with questions regarding bills from these companies.  You will be contacted with the lab results as soon as they are available. The fastest way to get your results is to activate your My Chart account. Instructions are located on the last page of this paperwork. If you have not heard from [place] regarding the results in 2 weeks, please contact this office.

## 2016-12-29 ENCOUNTER — Ambulatory Visit: Payer: Medicare Other | Admitting: Adult Health

## 2016-12-29 ENCOUNTER — Encounter: Payer: Self-pay | Admitting: Adult Health

## 2016-12-29 VITALS — BP 140/88 | HR 73 | Ht 67.0 in | Wt 169.2 lb

## 2016-12-29 DIAGNOSIS — G309 Alzheimer's disease, unspecified: Secondary | ICD-10-CM | POA: Diagnosis not present

## 2016-12-29 DIAGNOSIS — F0280 Dementia in other diseases classified elsewhere without behavioral disturbance: Secondary | ICD-10-CM | POA: Diagnosis not present

## 2016-12-29 MED ORDER — DONEPEZIL HCL 10 MG PO TABS: 10.0000 mg | ORAL_TABLET | Freq: Every day | ORAL | 11 refills | Status: DC

## 2016-12-29 NOTE — Progress Notes (Signed)
I have read the note, and I agree with the clinical assessment and plan.  Kerstyn Coryell K Yohana Bartha   

## 2016-12-29 NOTE — Patient Instructions (Signed)
Your Plan:  Continue Aricept 10 mg at bedtime Memory score is stable If your symptoms worsen or you develop new symptoms please let us know.       Thank you for coming to see us at [facility]. I hope we have been able to provide you high quality care today.  You may receive a patient satisfaction survey over the next few weeks. We would appreciate your feedback and comments so that we may continue to improve ourselves and the health of our patients.  

## 2016-12-29 NOTE — Progress Notes (Signed)
PATIENT: Tammy [name] DOB: 1946-05-05  REASON FOR VISIT: follow up HISTORY FROM: patient  HISTORY OF PRESENT ILLNESS: Today 12/29/16  Tammy [name] is a 71 year old female with a history of progressive memory disturbance consistent with Alzheimer's disease.  She returns today for follow-up.  At the last visit the patient was restarted on Aricept.  Husband reports that she is tolerating the medication well.  She does require assistance with all ADLs.  Her husband manages the finances and prepares all meals.  Her daughter assists her with all her hygiene needs.  The family denies any trouble sleeping.  Denies any hallucinations or agitation or aggressiveness.  Overall the patient has remained stable.  She returns today for an evaluation.   HISTORY 06/26/16: Tammy [name] is a 71 year old female with a history of progressive memory disturbance consistent with Alzheimer's disease. She returns today for follow-up. Her husband states that he stopped Aricept because he felt that it was causing her to wander at night. He states that since he discontinued the medication she has not had any more episodes. She requires assistance with ADLs. Her daughter and husband do all the cooking. Her husband handles all the finances. Patient is no longer driving. Patient's husband reports that her bedtime varies. She returns today for an evaluation.  REVIEW OF SYSTEMS: Out of a complete 14 system review of symptoms, the patient complains only of the following symptoms, and all other reviewed systems are negative. See HPI  ALLERGIES: Allergies  Allergen Reactions  . Sulfa Antibiotics Palpitations    HOME MEDICATIONS: Outpatient Medications Prior to Visit  Medication Sig Dispense Refill  . docusate sodium (COLACE) 100 MG capsule Take 1 capsule (100 mg total) by mouth daily. 30 capsule 11  . donepezil (ARICEPT) 10 MG tablet Take 1 tablet (10 mg total) by mouth at bedtime. 30 tablet 5  . hydrochlorothiazide  (HYDRODIURIL) 25 MG tablet Take 1 tablet (25 mg total) daily by mouth. 90 tablet 3  . HYDROcodone-acetaminophen (NORCO) 5-325 MG tablet Take 1-2 tablets by mouth every 4 (four) hours as needed. 20 tablet 0  . mirtazapine (REMERON) 15 MG tablet TAKE 1 TABLET BY MOUTH AT BEDTIME 30 tablet 5  . tamsulosin (FLOMAX) 0.4 MG CAPS capsule Take 1 capsule (0.4 mg total) daily by mouth. 30 capsule 0   No facility-administered medications prior to visit.     PAST MEDICAL HISTORY: Past Medical History:  Diagnosis Date  . Alzheimer's disease 12/28/2014  . Headache disorder 12/28/2014  . Memory disturbance 12/28/2014    PAST SURGICAL HISTORY: No past surgical history on file.  FAMILY HISTORY: Family History  Problem Relation Age of Onset  . Heart disease Brother   . Heart attack Mother   . Dementia Mother     SOCIAL HISTORY: Social History   Socioeconomic History  . Marital status: Married    Spouse name: [name]  . Number of children: 3  . Years of education: 9  . Highest education level: Not on file  Social Needs  . Financial resource strain: Not on file  . Food insecurity - worry: Not on file  . Food insecurity - inability: Not on file  . Transportation needs - medical: Not on file  . Transportation needs - non-medical: Not on file  Occupational History  . Occupation: retired  Tobacco Use  . Smoking status: Never Smoker  . Smokeless tobacco: Never Used  Substance and Sexual Activity  . Alcohol use: No  . Drug use: No  .  Sexual activity: Yes  Other Topics Concern  . Not on file  Social History Narrative   Married with 3 children   Right handed   8th grade   1 cup coffee daily      PHYSICAL EXAM  Vitals:   12/29/16 0856  BP: 140/88  Pulse: 73  Weight: 169 lb 3.2 oz (76.7 kg)  Height: 5\' 7"  (1.702 m)   Body mass index is 26.5 kg/m.  Generalized: Well developed, in no acute distress   Neurological examination  Mentation: Alert. Follows all commands speech  and language fluent Cranial nerve II-XII: Pupils were equal round reactive to light. Extraocular movements were full, visual field were full on confrontational test. Facial sensation and strength were normal. Uvula tongue midline. Head turning and shoulder shrug  were normal and symmetric. Motor: The motor testing reveals 5 over 5 strength of all 4 extremities. Good symmetric motor tone is noted throughout.  Sensory: Sensory testing is intact to soft touch on all 4 extremities. No evidence of extinction is noted.  Coordination: Cerebellar testing reveals good finger-nose-finger and heel-to-shin bilaterally.  Gait and station: Gait is normal.  Reflexes: Deep tendon reflexes are symmetric and normal bilaterally.   DIAGNOSTIC DATA (LABS, IMAGING, TESTING) - I reviewed patient records, labs, notes, testing and imaging myself where available.  Lab Results  Component Value Date   WBC 4.9 11/19/2016   HGB 11.5 (A) 11/19/2016   HCT 35.3 (A) 11/19/2016   MCV 84.8 11/19/2016   PLT 267 11/09/2016      Component Value Date/Time   NA 146 (H) 11/19/2016 1541   K 3.5 11/19/2016 1541   CL 107 (H) 11/19/2016 1541   CO2 26 11/19/2016 1541   GLUCOSE 93 11/19/2016 1541   GLUCOSE 117 (H) 11/09/2016 1603   BUN 16 11/19/2016 1541   CREATININE 0.81 11/19/2016 1541   CREATININE 0.63 12/19/2014 1246   CALCIUM 9.3 11/19/2016 1541   PROT 7.5 11/19/2016 1541   ALBUMIN 4.4 11/19/2016 1541   AST 15 11/19/2016 1541   ALT 10 11/19/2016 1541   ALKPHOS 63 11/19/2016 1541   BILITOT 0.2 11/19/2016 1541   GFRNONAA 74 11/19/2016 1541   GFRAA 86 11/19/2016 1541   Lab Results  Component Value Date   CHOL 186 06/02/2013   HDL 69 06/02/2013   LDLCALC 108 (H) 06/02/2013   TRIG 43 06/02/2013   Lab Results  Component Value Date   HGBA1C 5.8 (H) 08/04/2016   Lab Results  Component Value Date   VITAMINB12 626 02/22/2016   Lab Results  Component Value Date   TSH 1.040 08/04/2016      ASSESSMENT AND  PLAN 71 y.o. year old female  has a past medical history of Alzheimer's disease (12/28/2014), Headache disorder (12/28/2014), and Memory disturbance (12/28/2014). here with:  1.  Alzheimer's disease  Overall the patient has remained stable.  She will continue on Aricept 10 mg at bedtime.  I have advised the patient and her family that if her symptoms worsen or she develops new symptoms they should let us know.  She will follow-up in 6 months or sooner if needed.    Ward Givens, MSN, NP-C 12/29/2016, 9:21 AM [facility] [address], [place], [place] [address] [phone]

## 2016-12-31 ENCOUNTER — Ambulatory Visit: Payer: Medicare Other | Admitting: Physician Assistant

## 2016-12-31 ENCOUNTER — Other Ambulatory Visit: Payer: Self-pay

## 2016-12-31 ENCOUNTER — Encounter: Payer: Self-pay | Admitting: Physician Assistant

## 2016-12-31 VITALS — BP 113/79 | HR 92 | Temp 99.0°F | Resp 16 | Ht 66.0 in | Wt 165.0 lb

## 2016-12-31 DIAGNOSIS — F02818 Dementia in other diseases classified elsewhere, unspecified severity, with other behavioral disturbance: Secondary | ICD-10-CM

## 2016-12-31 DIAGNOSIS — G300 Alzheimer's disease with early onset: Secondary | ICD-10-CM

## 2016-12-31 DIAGNOSIS — I10 Essential (primary) hypertension: Secondary | ICD-10-CM | POA: Insufficient documentation

## 2016-12-31 DIAGNOSIS — F0281 Dementia in other diseases classified elsewhere with behavioral disturbance: Secondary | ICD-10-CM

## 2016-12-31 NOTE — Patient Instructions (Addendum)
Please schedule an annual exam in 8 months.   Thank you for coming in today. I hope you feel we met your needs.  Feel free to call PCP if you have any questions or further requests.  Please consider signing up for MyChart if you do not already have it, as this is a great way to communicate with me.  Best,  Whitney McVey, PA-C   DASH Eating Plan DASH stands for "Dietary Approaches to Stop Hypertension." The DASH eating plan is a healthy eating plan that has been shown to reduce high blood pressure (hypertension). It may also reduce your risk for type 2 diabetes, heart disease, and stroke. The DASH eating plan may also help with weight loss. What are tips for following this plan? General guidelines  Avoid eating more than 2,300 mg (milligrams) of salt (sodium) a day. If you have hypertension, you may need to reduce your sodium intake to 1,500 mg a day.  Limit alcohol intake to no more than 1 drink a day for nonpregnant women and 2 drinks a day for men. One drink equals 12 oz of beer, 5 oz of wine, or 1 oz of hard liquor.  Work with your health care provider to maintain a healthy body weight or to lose weight. Ask what an ideal weight is for you.  Get at least 30 minutes of exercise that causes your heart to beat faster (aerobic exercise) most days of the week. Activities may include walking, swimming, or biking.  Work with your health care provider or diet and nutrition specialist (dietitian) to adjust your eating plan to your individual calorie needs. Reading food labels  Check food labels for the amount of sodium per serving. Choose foods with less than 5 percent of the Daily Value of sodium. Generally, foods with less than 300 mg of sodium per serving fit into this eating plan.  To find whole grains, look for the word "whole" as the first word in the ingredient list. Shopping  Buy products labeled as "low-sodium" or "no salt added."  Buy fresh foods. Avoid canned foods and  premade or frozen meals. Cooking  Avoid adding salt when cooking. Use salt-free seasonings or herbs instead of table salt or sea salt. Check with your health care provider or pharmacist before using salt substitutes.  Do not fry foods. Cook foods using healthy methods such as baking, boiling, grilling, and broiling instead.  Cook with heart-healthy oils, such as olive, canola, soybean, or sunflower oil. Meal planning   Eat a balanced diet that includes: ? 5 or more servings of fruits and vegetables each day. At each meal, try to fill half of your plate with fruits and vegetables. ? Up to 6-8 servings of whole grains each day. ? Less than 6 oz of lean meat, poultry, or fish each day. A 3-oz serving of meat is about the same size as a deck of cards. One egg equals 1 oz. ? 2 servings of low-fat dairy each day. ? A serving of nuts, seeds, or beans 5 times each week. ? Heart-healthy fats. Healthy fats called Omega-3 fatty acids are found in foods such as flaxseeds and coldwater fish, like sardines, salmon, and mackerel.  Limit how much you eat of the following: ? Canned or prepackaged foods. ? Food that is high in trans fat, such as fried foods. ? Food that is high in saturated fat, such as fatty meat. ? Sweets, desserts, sugary drinks, and other foods with added sugar. ? Full-fat dairy  products.  Do not salt foods before eating.  Try to eat at least 2 vegetarian meals each week.  Eat more home-cooked food and less restaurant, buffet, and fast food.  When eating at a restaurant, ask that your food be prepared with less salt or no salt, if possible. What foods are recommended? The items listed may not be a complete list. Talk with your dietitian about what dietary choices are best for you. Grains Whole-grain or whole-wheat bread. Whole-grain or whole-wheat pasta. Brown rice. [place] Morrow. Bulgur. Whole-grain and low-sodium cereals. Pita bread. Low-fat, low-sodium crackers.  Whole-wheat flour tortillas. Vegetables Fresh or frozen vegetables (raw, steamed, roasted, or grilled). Low-sodium or reduced-sodium tomato and vegetable juice. Low-sodium or reduced-sodium tomato sauce and tomato paste. Low-sodium or reduced-sodium canned vegetables. Fruits All fresh, dried, or frozen fruit. Canned fruit in natural juice (without added sugar). Meat and other protein foods Skinless chicken or [place]. Ground chicken or [place]. Pork with fat trimmed off. Fish and seafood. Egg whites. Dried beans, peas, or lentils. Unsalted nuts, nut butters, and seeds. Unsalted canned beans. Lean cuts of beef with fat trimmed off. Low-sodium, lean deli meat. Dairy Low-fat (1%) or fat-free (skim) milk. Fat-free, low-fat, or reduced-fat cheeses. Nonfat, low-sodium ricotta or cottage cheese. Low-fat or nonfat yogurt. Low-fat, low-sodium cheese. Fats and oils Soft margarine without trans fats. Vegetable oil. Low-fat, reduced-fat, or light mayonnaise and salad dressings (reduced-sodium). Canola, safflower, olive, soybean, and sunflower oils. Avocado. Seasoning and other foods Herbs. Spices. Seasoning mixes without salt. Unsalted popcorn and pretzels. Fat-free sweets. What foods are not recommended? The items listed may not be a complete list. Talk with your dietitian about what dietary choices are best for you. Grains Baked goods made with fat, such as croissants, muffins, or some breads. Dry pasta or rice meal packs. Vegetables Creamed or fried vegetables. Vegetables in a cheese sauce. Regular canned vegetables (not low-sodium or reduced-sodium). Regular canned tomato sauce and paste (not low-sodium or reduced-sodium). Regular tomato and vegetable juice (not low-sodium or reduced-sodium). Angie Fava. Olives. Fruits Canned fruit in a light or heavy syrup. Fried fruit. Fruit in cream or butter sauce. Meat and other protein foods Fatty cuts of meat. Ribs. Fried meat. Berniece Salines. Sausage. Bologna and other  processed lunch meats. Salami. Fatback. Hotdogs. Bratwurst. Salted nuts and seeds. Canned beans with added salt. Canned or smoked fish. Whole eggs or egg yolks. Chicken or [place] with skin. Dairy Whole or 2% milk, cream, and half-and-half. Whole or full-fat cream cheese. Whole-fat or sweetened yogurt. Full-fat cheese. Nondairy creamers. Whipped toppings. Processed cheese and cheese spreads. Fats and oils Butter. Stick margarine. Lard. Shortening. Ghee. Bacon fat. Tropical oils, such as coconut, palm kernel, or palm oil. Seasoning and other foods Salted popcorn and pretzels. Onion salt, garlic salt, seasoned salt, table salt, and sea salt. Worcestershire sauce. Tartar sauce. Barbecue sauce. Teriyaki sauce. Soy sauce, including reduced-sodium. Steak sauce. Canned and packaged gravies. Fish sauce. Oyster sauce. Cocktail sauce. Horseradish that you find on the shelf. Ketchup. Mustard. Meat flavorings and tenderizers. Bouillon cubes. Hot sauce and Tabasco sauce. Premade or packaged marinades. Premade or packaged taco seasonings. Relishes. Regular salad dressings. Where to find more information:  National Heart, Lung, and [place]: https://wilson-eaton.com/  American Heart Association: www.heart.org Summary  The DASH eating plan is a healthy eating plan that has been shown to reduce high blood pressure (hypertension). It may also reduce your risk for type 2 diabetes, heart disease, and stroke.  With the DASH eating plan, you should limit  salt (sodium) intake to 2,300 mg a day. If you have hypertension, you may need to reduce your sodium intake to 1,500 mg a day.  When on the DASH eating plan, aim to eat more fresh fruits and vegetables, whole grains, lean proteins, low-fat dairy, and heart-healthy fats.  Work with your health care provider or diet and nutrition specialist (dietitian) to adjust your eating plan to your individual calorie needs. This information is not intended to replace advice given to  you by your health care provider. Make sure you discuss any questions you have with your health care provider. Document Released: 12/19/2010 Document Revised: 12/24/2015 Document Reviewed: 12/24/2015 Elsevier Interactive Patient Education  2018 [employer].  IF you received an x-ray today, you will receive an invoice from [facility]. Please contact [facility] at [phone] with questions or concerns regarding your invoice.   IF you received labwork today, you will receive an invoice from [place]. Please contact [facility] at [phone] with questions or concerns regarding your invoice.   Our billing staff will not be able to assist you with questions regarding bills from these companies.  You will be contacted with the lab results as soon as they are available. The fastest way to get your results is to activate your My Chart account. Instructions are located on the last page of this paperwork. If you have not heard from [place] regarding the results in 2 weeks, please contact this office.

## 2016-12-31 NOTE — Progress Notes (Signed)
Tammy [name]  MRN: [id number] DOB: 08/14/1946  PCP: [name], MD  Subjective:  Pt is a 71 year old female PE, DVT, alzheimer's, prediabetes, ovarian mass who presents to clinic for blood pressure check. She is here today with her husband.  She was here 11/19 c/o swollen b/l feet. She was started on  HCTZ 25mg  qd. Feeling well today. No medication sie effects. Swelling of feet has resolved.  She has been wearing compression stockings and elevating her feet. Denies shob, palpitations, chest pain, LES swelling.   Review of Systems  Constitutional: Negative for diaphoresis and fatigue.  Respiratory: Negative for cough and shortness of breath.   Cardiovascular: Negative for chest pain, palpitations and leg swelling.  Gastrointestinal: Negative for nausea and vomiting.    Patient Active Problem List   Diagnosis Date Noted  . Pelvic mass in female 03/11/2016  . 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 Ovarian mass 02/24/2016  . Pulmonary embolus ([place]) 02/23/2016  . Vitamin D deficiency 01/02/2015  . Prediabetes 01/02/2015  . Memory disturbance 12/28/2014  . Headache disorder 12/28/2014  . Alzheimer's disease 12/28/2014    Current Outpatient Medications on File Prior to Visit  Medication Sig Dispense Refill  . donepezil (ARICEPT) 10 MG tablet Take 1 tablet (10 mg total) by mouth at bedtime. 30 tablet 11  . hydrochlorothiazide (HYDRODIURIL) 25 MG tablet Take 1 tablet (25 mg total) daily by mouth. 90 tablet 3  . mirtazapine (REMERON) 15 MG tablet TAKE 1 TABLET BY MOUTH AT BEDTIME 30 tablet 5  . tamsulosin (FLOMAX) 0.4 MG CAPS capsule Take 1 capsule (0.4 mg total) daily by mouth. 30 capsule 0  . docusate sodium (COLACE) 100 MG capsule Take 1 capsule (100 mg total) by mouth daily. (Patient not taking: Reported on 12/31/2016) 30 capsule 11  . HYDROcodone-acetaminophen (NORCO) 5-325 MG tablet Take 1-2 tablets by mouth every 4 (four) hours as needed. (Patient not taking: Reported on  12/31/2016) 20 tablet 0   No current facility-administered medications on file prior to visit.     Allergies  Allergen Reactions  . Sulfa Antibiotics Palpitations     Objective:  BP 113/79   Pulse 92   Temp 99 F (37.2 C) (Oral)   Resp 16   Ht 5\' 6"  (1.676 m)   Wt 165 lb (74.8 kg)   SpO2 100%   BMI 26.63 kg/m   Physical Exam  Constitutional: She is oriented to person, place, and time and well-developed, well-nourished, and in no distress. No distress.  Cardiovascular: Normal rate, regular rhythm and normal heart sounds.  Musculoskeletal:       Right ankle: She exhibits no swelling.       Left ankle: She exhibits no swelling.       Right lower leg: She exhibits no swelling and no edema.       Left lower leg: She exhibits no swelling and no edema.  Neurological: She is alert and oriented to person, place, and time. GCS score is 15.  Skin: Skin is warm and dry.  Psychiatric: Mood, memory, affect and judgment normal.  Vitals reviewed.   Assessment and Plan :  1. Essential hypertension 2. Early onset Alzheimer's disease with behavioral disturbance - Pt here for f/u LES edema. She was here one month ago, started HCTZ 25mg  qd. Since that OV her LES swelling has completely resolved. Denies medication side effects. Her blood pressure has also since improved. Con't taking this medication qd. RTC in 6  months for annual exam.    Mercer Pod, PA-C  [facility] [place] 12/31/2016 10:48 AM

## 2017-01-14 ENCOUNTER — Other Ambulatory Visit: Payer: Self-pay

## 2017-01-14 ENCOUNTER — Encounter: Payer: Self-pay | Admitting: Physician Assistant

## 2017-01-14 ENCOUNTER — Ambulatory Visit (INDEPENDENT_AMBULATORY_CARE_PROVIDER_SITE_OTHER): Payer: Medicare Other | Admitting: Physician Assistant

## 2017-01-14 VITALS — BP 118/78 | HR 81 | Temp 98.3°F | Resp 18 | Ht 66.0 in | Wt 161.8 lb

## 2017-01-14 DIAGNOSIS — R82998 Other abnormal findings in urine: Secondary | ICD-10-CM | POA: Diagnosis not present

## 2017-01-14 DIAGNOSIS — R4689 Other symptoms and signs involving appearance and behavior: Secondary | ICD-10-CM

## 2017-01-14 LAB — POCT CBC
GRANULOCYTE PERCENT: 56.8 % (ref 37–80)
HEMATOCRIT: 39.5 % (ref 37.7–47.9)
HEMOGLOBIN: 13.1 g/dL (ref 12.2–16.2)
LYMPH, POC: 2.2 (ref 0.6–3.4)
MCH, POC: 27.9 pg (ref 27–31.2)
MCHC: 33.2 g/dL (ref 31.8–35.4)
MCV: 84.1 fL (ref 80–97)
MID (cbc): 0.2 (ref 0–0.9)
MPV: 8.3 fL (ref 0–99.8)
PLATELET COUNT, POC: 225 10*3/uL (ref 142–424)
POC GRANULOCYTE: 3.1 (ref 2–6.9)
POC LYMPH PERCENT: 39.5 %L (ref 10–50)
POC MID %: 3.7 %M (ref 0–12)
RBC: 4.7 M/uL (ref 4.04–5.48)
RDW, POC: 13.3 %
WBC: 5.5 10*3/uL (ref 4.6–10.2)

## 2017-01-14 LAB — POCT URINALYSIS DIP (MANUAL ENTRY)
GLUCOSE UA: NEGATIVE mg/dL
NITRITE UA: NEGATIVE
Protein Ur, POC: 30 mg/dL — AB
Spec Grav, UA: 1.025 (ref 1.010–1.025)
Urobilinogen, UA: 0.2 E.U./dL
pH, UA: 5.5 (ref 5.0–8.0)

## 2017-01-14 MED ORDER — CEFTRIAXONE SODIUM 1 G IJ SOLR
1.0000 g | Freq: Once | INTRAMUSCULAR | Status: AC
Start: 2017-01-14 — End: 2017-01-14
  Administered 2017-01-14: 1 g via INTRAMUSCULAR

## 2017-01-14 NOTE — Patient Instructions (Signed)
     IF you received an x-ray today, you will receive an invoice from [facility]. Please contact  Radiology at [phone] with questions or concerns regarding your invoice.   IF you received labwork today, you will receive an invoice from [facility]. Please contact [facility] at [phone] with questions or concerns regarding your invoice.   Our billing staff will not be able to assist you with questions regarding bills from these companies.  You will be contacted with the lab results as soon as they are available. The fastest way to get your results is to activate your My Chart account. Instructions are located on the last page of this paperwork. If you have not heard from us regarding the results in 2 weeks, please contact this office.     

## 2017-01-14 NOTE — Progress Notes (Signed)
01/14/2017 2:55 PM   DOB: 08/14/1946 / MRN: [id number]  SUBJECTIVE:  Tammy [name] is a 72 y.o. female presenting for decrease PO consumption and increased tearfullness that has been worsening over the last week. She does not complain of pain today, however most of what she says is incoherent. Husband tells me there has been some weight loss, but this is in the setting of diuresis which was started for leg swelling on 12/01/16 and her weight began decreasing precipitously after that. Her weight topped out at 183 in June of 18 and held rather steadily until the last few weeks.  She was placed on Mirtazipine in May 2018 for a similar complaint.   Immunization History  Administered Date(s) Administered  . Influenza,inj,Quad PF,6+ Mos 12/19/2014, 02/22/2016  . Pneumococcal Conjugate-13 08/04/2016     She is allergic to sulfa antibiotics.   She  has a past medical history of Alzheimer's disease (12/28/2014), Headache disorder (12/28/2014), and Memory disturbance (12/28/2014).    She  reports that  has never smoked. she has never used smokeless tobacco. She reports that she does not drink alcohol or use drugs. She  reports that she currently engages in sexual activity. The patient  has no past surgical history on file.  Her family history includes Dementia in her mother; Heart attack in her mother; Heart disease in her brother.  Review of Systems  Constitutional: Positive for weight loss. Negative for chills, diaphoresis and fever.  Respiratory: Negative for cough and shortness of breath.   Cardiovascular: Negative for chest pain and leg swelling.  Gastrointestinal: Negative for nausea.  Musculoskeletal: Negative for joint pain and myalgias.  Skin: Negative for rash.  Neurological: Negative for dizziness and weakness.    The problem list and medications were reviewed and updated by myself where necessary and exist elsewhere in the encounter.   OBJECTIVE:  BP 118/78   Pulse 81   Temp  98.3 F (36.8 C) (Oral)   Resp 18   Ht 5\' 6"  (1.676 m)   Wt 161 lb 12.8 oz (73.4 kg)   SpO2 98%   BMI 26.12 kg/m   Wt Readings from Last 3 Encounters:  01/14/17 161 lb 12.8 oz (73.4 kg)  12/31/16 165 lb (74.8 kg)  12/29/16 169 lb 3.2 oz (76.7 kg)   Lab Results  Component Value Date   CREATININE 0.81 11/19/2016   BUN 16 11/19/2016   NA 146 (H) 11/19/2016   K 3.5 11/19/2016   CL 107 (H) 11/19/2016   CO2 26 11/19/2016    Physical Exam  Constitutional: She is oriented to person, place, and time. She is active.  Non-toxic appearance.  Eyes: EOM are normal. Pupils are equal, round, and reactive to light.  Cardiovascular: Normal rate, regular rhythm, S1 normal, S2 normal, normal heart sounds and intact distal pulses. Exam reveals no gallop, no friction rub and no decreased pulses.  No murmur heard. Pulmonary/Chest: Effort normal. No stridor. No tachypnea. No respiratory distress. She has no wheezes. She has no rales.  Abdominal: She exhibits no distension.  Musculoskeletal: She exhibits no edema, tenderness or deformity.  Neurological: She is alert and oriented to person, place, and time. She has normal strength and normal reflexes. She is not disoriented. She displays no atrophy and normal reflexes. No cranial nerve deficit or sensory deficit. She exhibits normal muscle tone. Coordination and gait normal.  Skin: Skin is warm and dry. She is not diaphoretic. No pallor.  Psychiatric:  She is tearful  and mostly incoherent through some of the interview. At other times she is quiet and able to answer yes and no questions.    Results for orders placed or performed in visit on 01/14/17 (from the past 72 hour(s))  POCT CBC     Status: None   Collection Time: 01/14/17  2:01 PM  Result Value Ref Range   WBC 5.5 4.6 - 10.2 K/uL   Lymph, poc 2.2 0.6 - 3.4   POC LYMPH PERCENT 39.5 10 - 50 %L   MID (cbc) 0.2 0 - 0.9   POC MID % 3.7 0 - 12 %M   POC Granulocyte 3.1 2 - 6.9   Granulocyte  percent 56.8 37 - 80 %G   RBC 4.70 4.04 - 5.48 M/uL   Hemoglobin 13.1 12.2 - 16.2 g/dL   HCT, POC 39.5 37.7 - 47.9 %   MCV 84.1 80 - 97 fL   MCH, POC 27.9 27 - 31.2 pg   MCHC 33.2 31.8 - 35.4 g/dL   RDW, POC 13.3 %   Platelet Count, POC 225 142 - 424 K/uL   MPV 8.3 0 - 99.8 fL  POCT urinalysis dipstick     Status: Abnormal   Collection Time: 01/14/17  2:53 PM  Result Value Ref Range   Color, UA yellow yellow   Clarity, UA clear clear   Glucose, UA negative negative mg/dL   Bilirubin, UA small (A) negative   Ketones, POC UA small (15) (A) negative mg/dL   Spec Grav, UA 1.025 1.010 - 1.025   Blood, UA moderate (A) negative   pH, UA 5.5 5.0 - 8.0   Protein Ur, POC =30 (A) negative mg/dL   Urobilinogen, UA 0.2 0.2 or 1.0 E.U./dL   Nitrite, UA Negative Negative   Leukocytes, UA Large (3+) (A) Negative    No results found.  ASSESSMENT AND PLAN:  Breanna was seen today for abdominal pain.  Diagnoses and all orders for this visit:  Spell of abnormal behavior -     POCT urinalysis dipstick -     POCT CBC -     Basic Metabolic Panel  Urine leukocytes: Only enough urine for a dip thus no culture.  Will see her back in two days to check her progress and to get a better urine specimen, however a UTI certainly explains her symptoms.  -     cefTRIAXone (ROCEPHIN) injection 1 g    The patient is advised to call or return to clinic if she does not see an improvement in symptoms, or to seek the care of the closest emergency department if she worsens with the above plan.   Philis Fendt, MHS, PA-C [facility] at [facility] 01/14/2017 2:55 PM

## 2017-01-15 LAB — BASIC METABOLIC PANEL
BUN/Creatinine Ratio: 25 (ref 12–28)
BUN: 33 mg/dL — AB (ref 8–27)
CALCIUM: 10.5 mg/dL — AB (ref 8.7–10.3)
CHLORIDE: 98 mmol/L (ref 96–106)
CO2: 26 mmol/L (ref 20–29)
Creatinine, Ser: 1.33 mg/dL — ABNORMAL HIGH (ref 0.57–1.00)
GFR calc non Af Amer: 41 mL/min/{1.73_m2} — ABNORMAL LOW (ref >59)
GFR, EST AFRICAN AMERICAN: 47 mL/min/{1.73_m2} — AB (ref >59)
Glucose: 107 mg/dL — ABNORMAL HIGH (ref 65–99)
Potassium: 3.4 mmol/L — ABNORMAL LOW (ref 3.5–5.2)
Sodium: 145 mmol/L — ABNORMAL HIGH (ref 134–144)

## 2017-01-16 ENCOUNTER — Ambulatory Visit: Payer: Medicare Other | Admitting: Physician Assistant

## 2017-01-19 ENCOUNTER — Other Ambulatory Visit: Payer: Self-pay

## 2017-01-19 ENCOUNTER — Encounter: Payer: Self-pay | Admitting: Physician Assistant

## 2017-01-19 ENCOUNTER — Ambulatory Visit (INDEPENDENT_AMBULATORY_CARE_PROVIDER_SITE_OTHER): Payer: Medicare Other | Admitting: Physician Assistant

## 2017-01-19 VITALS — BP 130/82 | Temp 98.3°F | Resp 16 | Ht 66.0 in | Wt 163.6 lb

## 2017-01-19 DIAGNOSIS — R82998 Other abnormal findings in urine: Secondary | ICD-10-CM | POA: Diagnosis not present

## 2017-01-19 DIAGNOSIS — R4689 Other symptoms and signs involving appearance and behavior: Secondary | ICD-10-CM

## 2017-01-19 LAB — POCT URINALYSIS DIP (MANUAL ENTRY)
Glucose, UA: NEGATIVE mg/dL
NITRITE UA: NEGATIVE
PROTEIN UA: NEGATIVE mg/dL
Spec Grav, UA: 1.02 (ref 1.010–1.025)
Urobilinogen, UA: 0.2 E.U./dL
pH, UA: 5 (ref 5.0–8.0)

## 2017-01-19 LAB — POC MICROSCOPIC URINALYSIS (UMFC): Mucus: ABSENT

## 2017-01-19 MED ORDER — AMOXICILLIN-POT CLAVULANATE 500-125 MG PO TABS: 1.0000 | ORAL_TABLET | Freq: Two times a day (BID) | ORAL | 0 refills | Status: DC

## 2017-01-19 NOTE — Progress Notes (Signed)
01/19/2017 5:17 PM   DOB: 08/14/1946 / MRN: [id number]  SUBJECTIVE:  Tammy [name] is a 72 y.o. female presenting for recheck of UTI.  Her last urine results were complicated by her inability to provide a good sample despite the assistance of CMA Harrell Gave, who tried helped her in the bathroom and also cleaned her urethra in order to provide a clean sample. She was advised to follow up in about 2 days however she did not make that appointment.  She is however here today for recheck and her husband and primary care taker is here with her and tells me that she has been eating and drinking a little bit more so than previous to the last visit in which she received a shot of ceftriaxone under the presumption of 48 hour follow up. She is no longer crying.  Her weight is up three lbs.   She is allergic to sulfa antibiotics.   She  has a past medical history of Alzheimer's disease (12/28/2014), Headache disorder (12/28/2014), and Memory disturbance (12/28/2014).    She  reports that  has never smoked. she has never used smokeless tobacco. She reports that she does not drink alcohol or use drugs. She  reports that she currently engages in sexual activity. The patient  has no past surgical history on file.  Her family history includes Dementia in her mother; Heart attack in her mother; Heart disease in her brother.  ROS  The problem list and medications were reviewed and updated by myself where necessary and exist elsewhere in the encounter.   OBJECTIVE:  BP 130/82 (BP Location: Right Arm, Patient Position: Sitting, Cuff Size: Normal)   Temp 98.3 F (36.8 C) (Oral)   Resp 16   Ht 5\' 6"  (1.676 m)   Wt 163 lb 9.6 oz (74.2 kg)   BMI 26.41 kg/m   Wt Readings from Last 3 Encounters:  01/19/17 163 lb 9.6 oz (74.2 kg)  01/14/17 161 lb 12.8 oz (73.4 kg)  12/31/16 165 lb (74.8 kg)     Physical Exam  Results for orders placed or performed in visit on 01/19/17 (from the past 72 hour(s))  POCT  urinalysis dipstick     Status: Abnormal   Collection Time: 01/19/17  5:06 PM  Result Value Ref Range   Color, UA yellow yellow   Clarity, UA cloudy (A) clear   Glucose, UA negative negative mg/dL   Bilirubin, UA small (A) negative   Ketones, POC UA small (15) (A) negative mg/dL   Spec Grav, UA 1.020 1.010 - 1.025   Blood, UA trace-lysed (A) negative   pH, UA 5.0 5.0 - 8.0   Protein Ur, POC negative negative mg/dL   Urobilinogen, UA 0.2 0.2 or 1.0 E.U./dL   Nitrite, UA Negative Negative   Leukocytes, UA Large (3+) (A) Negative  POCT Microscopic Urinalysis ([facility])     Status: Abnormal   Collection Time: 01/19/17  5:16 PM  Result Value Ref Range   WBC,UR,HPF,POC Too numerous to count  (A) None WBC/hpf   RBC,UR,HPF,POC None None RBC/hpf   Bacteria Many (A) None, Too numerous to count   Mucus Absent Absent   Epithelial Cells, UR Per Microscopy Few (A) None, Too numerous to count cells/hpf   Lab Results  Component Value Date   CREATININE 1.33 (H) 01/14/2017   Lab Results  Component Value Date   CREATININE 1.33 (H) 01/14/2017   BUN 33 (H) 01/14/2017   NA 145 (H) 01/14/2017  K 3.4 (L) 01/14/2017   CL 98 01/14/2017   CO2 26 01/14/2017   Estimated Creatinine Clearance: 41.2 mL/min (A) (by C-G formula based on SCr of 1.33 mg/dL (H)).     No results found.  ASSESSMENT AND PLAN:  Dorismar was seen today for follow-up.  Diagnoses and all orders for this visit:  Urine leukocytes: The infection has persisted.  Will go ahead and treat for UTI with renal dosing. She is due for follow up with Dr. Brigitte Pulse on the 21 st.  Mr. Prisco will bring her back if there is any acute changes in her behavior.  -     POCT urinalysis dipstick -     POCT Microscopic Urinalysis ([facility])  Spell of abnormal behavior: Improving.     The patient is advised to call or return to clinic if she does not see an improvement in symptoms, or to seek the care of the closest emergency department if she worsens with  the above plan.   Philis Fendt, MHS, PA-C [facility] at [facility] 01/19/2017 5:17 PM

## 2017-01-19 NOTE — Patient Instructions (Signed)
Come back in ten days and see myself or Dr. Brigitte Pulse.

## 2017-01-21 LAB — URINE CULTURE

## 2017-01-23 NOTE — Progress Notes (Signed)
I hesitate to stop the ABX given her presentation.  We ensured a clean catch in the office and the urine appears terribly infected on micro and dip.  I feel she should continue antibiotics and see Dr. Brigitte Pulse, as planned, this week. Philis Fendt, MS, PA-C 1:14 PM, 01/23/2017

## 2017-02-02 ENCOUNTER — Telehealth: Payer: Self-pay | Admitting: *Deleted

## 2017-02-02 ENCOUNTER — Other Ambulatory Visit: Payer: Self-pay

## 2017-02-02 ENCOUNTER — Ambulatory Visit (INDEPENDENT_AMBULATORY_CARE_PROVIDER_SITE_OTHER): Payer: Medicare Other | Admitting: Family Medicine

## 2017-02-02 ENCOUNTER — Encounter: Payer: Self-pay | Admitting: Family Medicine

## 2017-02-02 ENCOUNTER — Ambulatory Visit (INDEPENDENT_AMBULATORY_CARE_PROVIDER_SITE_OTHER): Payer: Medicare Other

## 2017-02-02 VITALS — BP 105/75 | HR 98 | Temp 97.6°F | Resp 16 | Ht 67.25 in | Wt 153.2 lb

## 2017-02-02 DIAGNOSIS — R7303 Prediabetes: Secondary | ICD-10-CM | POA: Diagnosis not present

## 2017-02-02 DIAGNOSIS — K5900 Constipation, unspecified: Secondary | ICD-10-CM | POA: Diagnosis not present

## 2017-02-02 DIAGNOSIS — R634 Abnormal weight loss: Secondary | ICD-10-CM

## 2017-02-02 DIAGNOSIS — G300 Alzheimer's disease with early onset: Secondary | ICD-10-CM | POA: Diagnosis not present

## 2017-02-02 DIAGNOSIS — E559 Vitamin D deficiency, unspecified: Secondary | ICD-10-CM

## 2017-02-02 DIAGNOSIS — F02818 Dementia in other diseases classified elsewhere, unspecified severity, with other behavioral disturbance: Secondary | ICD-10-CM

## 2017-02-02 DIAGNOSIS — F0281 Dementia in other diseases classified elsewhere with behavioral disturbance: Secondary | ICD-10-CM

## 2017-02-02 LAB — POCT URINALYSIS DIP (MANUAL ENTRY)
GLUCOSE UA: NEGATIVE mg/dL
NITRITE UA: NEGATIVE
Protein Ur, POC: 30 mg/dL — AB
Spec Grav, UA: 1.02 (ref 1.010–1.025)
Urobilinogen, UA: 0.2 E.U./dL
pH, UA: 5 (ref 5.0–8.0)

## 2017-02-02 MED ORDER — MIRTAZAPINE 30 MG PO TABS
30.0000 mg | ORAL_TABLET | Freq: Every day | ORAL | 2 refills | Status: DC
Start: 2017-02-02 — End: 2017-04-30

## 2017-02-02 MED ORDER — SENNOSIDES-DOCUSATE SODIUM 8.6-50 MG PO TABS: 2.0000 | ORAL_TABLET | Freq: Every day | ORAL | 1 refills | Status: DC

## 2017-02-02 NOTE — Progress Notes (Addendum)
Subjective:  This chart was scribed for [name], MD by Tamsen Roers, at [place] [facility].  This patient was seen in room 1 and the patient's care was started at 10:11 AM.   Chief Complaint  Patient presents with  . Weight Loss    follow up     Patient ID: Tammy [name], female    DOB: 1945-05-26, 72 y.o.   MRN: [id number]  HPI HPI Comments: Tammy [name] is a 72 y.o. female (goes by [name]) who presents to Primary Care at [facility] for a follow up. She is accompanied by her husband Sterling Big who is her full-time caretaker and provides all of the history as she has moderate-severe Alzheimer's dementia. She has been seen multiple times recently for decreased appetite and increased tearfulness/confusion.  She was found to possibly have a urine infection but not enough urine was obtained for a culture so she was given 1 gram of Rocephin and recommended close follow up however, five days later before she was seen at which point urine was clear but symptoms had improved so it was elected to continue treatment for UTI with Augmentin x 10 days. Urine looked approximately the same at each visit with blood, bilirubin and ketones.  She would have completed the antibiotic course four days prior. Last metabolic panel three weeks prior showed acute renal failure with creatinine bump from .8 to 1.3, likely due to dehydration with BUN elevated.  EGFR decreased 74 to 41. Patient lost 10 lbs in the past two weeks, 25 lbs in the past two months and 34 lbs in the past four months. She has Alzheimers dementia and was last seen by neurology one month prior who thought that the patient remained stable. She is currently on Aricept 10 mg.   Per husband, patient has not been eating.  When she expresses that she wants to eat, she only has a small amount.  She has been having bowel movements once per day and denies any abdominal pain. Patient has also been sleeping more frequently "day and night".  Her husband does  not feel that there has been any change in her memory or mood. She is still taking her antibiotics given for her UTI. Patient does not take any stool softeners. Patient has been compliant with her Aricept and Mirtazapine.   Patient has a follow up appointment with neurology in June.    Patient Active Problem List   Diagnosis Date Noted  . Essential hypertension 12/31/2016  . Pelvic mass in female 03/11/2016  . 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 Ovarian mass 02/24/2016  . Pulmonary embolus ([place]) 02/23/2016  . Vitamin D deficiency 01/02/2015  . Prediabetes 01/02/2015  . Memory disturbance 12/28/2014  . Headache disorder 12/28/2014  . Alzheimer's disease 12/28/2014   Past Medical History:  Diagnosis Date  . Alzheimer's disease 12/28/2014  . Headache disorder 12/28/2014  . Memory disturbance 12/28/2014   No past surgical history on file. Allergies  Allergen Reactions  . Sulfa Antibiotics Palpitations   Prior to Admission medications   Medication Sig Start Date End Date Taking? Authorizing Provider  amoxicillin-clavulanate (AUGMENTIN) 500-125 MG tablet Take 1 tablet (500 mg total) by mouth 2 (two) times daily. 01/19/17   Tereasa Coop, PA-C  docusate sodium (COLACE) 100 MG capsule Take 1 capsule (100 mg total) by mouth daily. 02/28/16   [name], MD  donepezil (ARICEPT) 10 MG tablet Take 1 tablet (10 mg total) by mouth at  bedtime. 12/29/16   Ward Givens, NP  hydrochlorothiazide (HYDRODIURIL) 25 MG tablet Take 1 tablet (25 mg total) daily by mouth. 12/01/16   McVey, Gelene Mink, PA-C  mirtazapine (REMERON) 15 MG tablet TAKE 1 TABLET BY MOUTH AT BEDTIME 08/15/16   [name], MD  tamsulosin (FLOMAX) 0.4 MG CAPS capsule Take 1 capsule (0.4 mg total) daily by mouth. 11/19/16   [name], MD   Social History   Socioeconomic History  . Marital status: Married    Spouse name: [name]  . Number of children: 3  . Years of education: 23  . Highest education level:  Not on file  Social Needs  . Financial resource strain: Not on file  . Food insecurity - worry: Not on file  . Food insecurity - inability: Not on file  . Transportation needs - medical: Not on file  . Transportation needs - non-medical: Not on file  Occupational History  . Occupation: retired  Tobacco Use  . Smoking status: Never Smoker  . Smokeless tobacco: Never Used  Substance and Sexual Activity  . Alcohol use: No  . Drug use: No  . Sexual activity: Yes  Other Topics Concern  . Not on file  Social History Narrative   Married with 3 children   Right handed   8th grade   1 cup coffee daily   Allergies  Allergen Reactions  . Sulfa Antibiotics Palpitations    Review of Systems  Constitutional: Positive for appetite change and fatigue.  HENT: Negative for congestion, nosebleeds and rhinorrhea.   Eyes: Negative for pain and redness.  Respiratory: Negative for cough and choking.   Gastrointestinal: Positive for abdominal pain. Negative for nausea and vomiting.       Objective:   Physical Exam  Constitutional: She appears well-developed and well-nourished. No distress.  HENT:  Head: Normocephalic and atraumatic.  Right Ear: External ear normal.  Left Ear: External ear normal.  Eyes: Conjunctivae are normal. No scleral icterus.  Neck: Normal range of motion. Neck supple. No thyromegaly present.  Cardiovascular: Normal rate, regular rhythm, normal heart sounds and intact distal pulses. Exam reveals no gallop and no friction rub.  No murmur heard. Pulmonary/Chest: Effort normal and breath sounds normal. No respiratory distress. She has no wheezes. She has no rales.  Abdominal:  Hyperactive bowel sounds abdomen has non specific firmness and generalized tenderness without rebound of guarding.   Musculoskeletal: She exhibits no edema.  Lymphadenopathy:    She has no cervical adenopathy.  Neurological: She is alert. She is disoriented. Gait normal.  Skin: Skin is warm  and dry. She is not diaphoretic. No erythema.  Psychiatric: She has a normal mood and affect.    Vitals:   02/02/17 0942  BP: 105/75  Pulse: 98  Resp: 16  Temp: 97.6 F (36.4 C)  TempSrc: Oral  SpO2: 96%  Weight: 153 lb 3.2 oz (69.5 kg)  Height: 5' 7.25" (1.708 m)   Results for orders placed or performed in visit on 02/02/17  POCT urinalysis dipstick  Result Value Ref Range   Color, UA yellow yellow   Clarity, UA clear clear   Glucose, UA negative negative mg/dL   Bilirubin, UA small (A) negative   Ketones, POC UA small (15) (A) negative mg/dL   Spec Grav, UA 1.020 1.010 - 1.025   Blood, UA moderate (A) negative   pH, UA 5.0 5.0 - 8.0   Protein Ur, POC =30 (A) negative mg/dL   Urobilinogen, UA  0.2 0.2 or 1.0 E.U./dL   Nitrite, UA Negative Negative   Leukocytes, UA Small (1+) (A) Negative          Assessment & Plan:   1. Loss of weight   2. Prediabetes   3. Early onset Alzheimer's disease with behavioral disturbance   4. Vitamin D deficiency   5. Constipation, unspecified constipation type    Pt does still have some vague abdominal tenderness so will have her start senokot S 2 tabs qhs to ensure some underlying chronic constipation is not exacerbating anorexia and weight loss. Increase mirtazapine 15 ->30. Stop hctz - last bmp showed ARF and dehydration and BP low today - likely if pt is taking less in, she will not need BP med and permissive low-grade elev BP should be fine. Cont aricept. Consider early f/u w/ neuro - f/u not sched until June and last OV note states pt's memory was stable but seems to now be worsening. Discussed that this could be the initial signs of end-stage dementia with a natural inclination to sleep more and eat less seen when the disease has worsened - advised that if sxs do not have found etiology and/or cont to progress, would likely be helpful to pt and husband to c/s hospice - husband was open to this though had not considered it prior.  Recheck in 1 mo - sooner if worse. If not improving, will c/s hospice and alert neuro to see if they would like to see pt back in office.  Orders Placed This Encounter  Procedures  . Urine Culture  . DG Abd 1 View    Standing Status:   Future    Number of Occurrences:   1    Standing Expiration Date:   02/02/2018    Order Specific Question:   Reason for Exam (SYMPTOM  OR DIAGNOSIS REQUIRED)    Answer:   decreased po, weight loss, severe Alzheimer's -> eval for constipation contributing to early satiety?    Order Specific Question:   Preferred imaging location?    Answer:   External  . CBC with Differential/Platelet  . Comprehensive metabolic panel  . TSH  . Hemoglobin A1c  . VITAMIN D 25 Hydroxy (Vit-D Deficiency, Fractures)  . POCT urinalysis dipstick    Meds ordered this encounter  Medications  . mirtazapine (REMERON) 30 MG tablet    Sig: Take 1 tablet (30 mg total) by mouth at bedtime.    Dispense:  30 tablet    Refill:  2    D/c prior rx for mirtazapine 15 mg a day  . senna-docusate (SENOKOT-S) 8.6-50 MG tablet    Sig: Take 2 tablets by mouth at bedtime.    Dispense:  180 tablet    Refill:  1    Please pull to give to pt's husband when he picks up her meds or show where it is at [place]. Thanks.    I personally performed the services described in this documentation, which was scribed in my presence. The recorded information has been reviewed and considered, and addended by me as needed.   Delman Cheadle, M.D.  Primary Care at [facility] [address] [place], [place] [address] [phone] phone [phone] fax  02/02/17 1:26 PM

## 2017-02-02 NOTE — Telephone Encounter (Signed)
Spoke to [name] in medical records @ PCP, fax operative and imaging reports to Dr Johna Sheriff at [facility].

## 2017-02-02 NOTE — Patient Instructions (Addendum)
Stop the hctz (hydrochlorothiazide) 94m blood pressure pill.  You can stop the antibiotic.  The only medications you need to take are all at night -  Donepezil (Aricept) 10 (for memory) Mirtazapine (Remeron) 334m(for appetite) - this dose was increased so take 2 tabs of your current bottle until you run out and start the new bottle at one a day. Senna-docusate (Senokot S) 8.6-50  2 tabs  Let me know if you would like me to place a referral to hospice before your next visit.    IF you received an x-ray today, you will [facility] an [facility]. Please contact [facility] at [phone] questions or concerns regarding your invoice.   IF you received labwork today, you will receive an invoice from [place] at [phone] questions or concerns regarding your invoice.   Our billing staff will not be able to assist you with questions regarding bills from these companies.  You will be contacted with the lab results as soon as they are available. The fastest way to get your results is to activate your My Chart account. Instructions are located on the last page of this paperwork. If you have not heard from [place] the results in 2 weeks, please contact this office.     Failure to Thrive, Adult Failure to thrive is a group of problems. These problems include eating too little and losing weight. People who have this condition may do fewer and fewer activities over time. They may lose interest in being with friends or they may not want to eat or drink. Follow these instructions at home:  Take medicines only as told by your doctor.  Eat a healthy, well-balanced diet. Make sure that you eat enough.  Be active. Do strength training. A physical therapist can help to set up an exercise program that fits you.  Make sure that you are safe at home.  Make sure that you have a plan for what to do if you cannot make decisions for  yourself. Contact a doctor if:  You are not able to eat well.  You are not able to move around.  You feel very sad.  You feel very hopeless. Get help right away if:  You think about ending your life.  You cannot eat or drink.  You do not get out of bed.  Staying at home is not safe.  You have a fever. This information is not intended to replace advice given to you by your health care provider. Make sure you discuss any questions you have with your health care provider. Document Released: 12/19/2010 Document Revised: 06/07/2015 Document Reviewed: 03/27/2014 Elsevier Interactive Patient Education  2018 [place] a service that is designed to provide people who are terminally ill and their families with medical, spiritual, and psychological support. Its aim is to improve your quality of life by keeping you as alert and comfortable as possible. Who will be my providers when I begin hospice care? Hospice teams often include:  A nurse.  A doctor. The hospice doctor will be available for your care, but you can bring your regular doctor or nurse practitioner.  Social workers.  Religious leaders (such as a chClinical biochemist  Trained volunteers.  What roles will providers play in my care? Hospice is performed by a team of health care professionals and volunteers who:  Help keep you comfortable: ? Hospice can be provided in your home or in a homelike setting. ?  The hospice staff works with your family and friends to help meet your needs. ? You will enjoy the support of loved ones by receiving much of your basic care from family and friends.  Provide pain relief and manage your symptoms. The staff supply all necessary medicines and equipment.  Provide companionship when you are alone.  Allow you and your family to rest. They may do light housekeeping, prepare meals, and run errands.  Provide counseling. They will make sure your emotional,  spiritual, and social needs and those of your family are being met.  Provide spiritual care: ? Spiritual care will be individualized to meet your needs and your family's needs. ? Spiritual care may involve:  Helping you look at what death means to you.  Helping you say goodbye to your family and friends.  Performing a specific religious ceremony or ritual.  When should hospice care begin? Most people who use hospice are believed to have fewer than 6 months to live.  Your family and health care providers can help you decide when hospice services should begin.  If your condition improves, you may discontinue the program.  What should I consider before selecting a program? Most hospice programs are run by nonprofit, independent organizations. Some are affiliated with hospitals, nursing homes, or home health care agencies. Hospice programs can take place in the home or at a hospice center, hospital, or skilled nursing facility. When choosing a hospice program, ask the following questions:  What services are available to me?  What services will be offered to my loved ones?  How involved will my loved ones be?  How involved will my health care provider be?  Who makes up the hospice care team? How are they trained or screened?  How will my pain and symptoms be managed?  If my circumstances change, can the services be provided in a different setting, such as my home or in the hospital?  Is the program reviewed and licensed by the state or certified in some other way?  Where can I learn more about hospice? You can learn about existing hospice programs in your area from your health care providers. You can also read more about hospice online. The websites of the following organizations contain helpful information:  The [facility] and Palliative Care Organization [facility]).  The Hospice Association of America ([place]).  The [place].  The American Cancer Society  (ACS).  Hospice Net.  This information is not intended to replace advice given to you by your health care provider. Make sure you discuss any questions you have with your health care provider. Document Released: 04/18/2003 Document Revised: 08/16/2015 Document Reviewed: 11/09/2012 Elsevier Interactive Patient Education  2017 [employer].

## 2017-02-03 LAB — URINE CULTURE

## 2017-02-03 LAB — CBC WITH DIFFERENTIAL/PLATELET
BASOS: 0 %
Basophils Absolute: 0 10*3/uL (ref 0.0–0.2)
EOS (ABSOLUTE): 0.1 10*3/uL (ref 0.0–0.4)
EOS: 2 %
HEMATOCRIT: 39.1 % (ref 34.0–46.6)
HEMOGLOBIN: 13 g/dL (ref 11.1–15.9)
IMMATURE GRANS (ABS): 0 10*3/uL (ref 0.0–0.1)
IMMATURE GRANULOCYTES: 0 %
LYMPHS: 28 %
Lymphocytes Absolute: 1.5 10*3/uL (ref 0.7–3.1)
MCH: 27.8 pg (ref 26.6–33.0)
MCHC: 33.2 g/dL (ref 31.5–35.7)
MCV: 84 fL (ref 79–97)
MONOCYTES: 10 %
Monocytes Absolute: 0.6 10*3/uL (ref 0.1–0.9)
NEUTROS ABS: 3.3 10*3/uL (ref 1.4–7.0)
NEUTROS PCT: 60 %
PLATELETS: 202 10*3/uL (ref 150–379)
RBC: 4.67 x10E6/uL (ref 3.77–5.28)
RDW: 14.5 % (ref 12.3–15.4)
WBC: 5.5 10*3/uL (ref 3.4–10.8)

## 2017-02-03 LAB — COMPREHENSIVE METABOLIC PANEL
A/G RATIO: 1.2 (ref 1.2–2.2)
ALBUMIN: 4.7 g/dL (ref 3.5–4.8)
ALT: 14 IU/L (ref 0–32)
AST: 25 IU/L (ref 0–40)
Alkaline Phosphatase: 80 IU/L (ref 39–117)
BILIRUBIN TOTAL: 0.5 mg/dL (ref 0.0–1.2)
BUN / CREAT RATIO: 22 (ref 12–28)
BUN: 38 mg/dL — ABNORMAL HIGH (ref 8–27)
CALCIUM: 10.3 mg/dL (ref 8.7–10.3)
CO2: 25 mmol/L (ref 20–29)
Chloride: 100 mmol/L (ref 96–106)
Creatinine, Ser: 1.75 mg/dL — ABNORMAL HIGH (ref 0.57–1.00)
GFR, EST AFRICAN AMERICAN: 34 mL/min/{1.73_m2} — AB (ref >59)
GFR, EST NON AFRICAN AMERICAN: 29 mL/min/{1.73_m2} — AB (ref >59)
Globulin, Total: 3.8 g/dL (ref 1.5–4.5)
Glucose: 112 mg/dL — ABNORMAL HIGH (ref 65–99)
POTASSIUM: 3.2 mmol/L — AB (ref 3.5–5.2)
Sodium: 148 mmol/L — ABNORMAL HIGH (ref 134–144)
TOTAL PROTEIN: 8.5 g/dL (ref 6.0–8.5)

## 2017-02-03 LAB — VITAMIN D 25 HYDROXY (VIT D DEFICIENCY, FRACTURES): Vit D, 25-Hydroxy: 17.9 ng/mL — ABNORMAL LOW (ref 30.0–100.0)

## 2017-02-03 LAB — TSH: TSH: 1.08 u[IU]/mL (ref 0.450–4.500)

## 2017-02-03 LAB — HEMOGLOBIN A1C
ESTIMATED AVERAGE GLUCOSE: 128 mg/dL
Hgb A1c MFr Bld: 6.1 % — ABNORMAL HIGH (ref 4.8–5.6)

## 2017-02-17 ENCOUNTER — Other Ambulatory Visit: Payer: Self-pay

## 2017-02-17 ENCOUNTER — Ambulatory Visit (INDEPENDENT_AMBULATORY_CARE_PROVIDER_SITE_OTHER): Payer: Medicare Other | Admitting: Family Medicine

## 2017-02-17 ENCOUNTER — Encounter: Payer: Self-pay | Admitting: Family Medicine

## 2017-02-17 VITALS — BP 130/84 | HR 100 | Temp 98.4°F | Ht 67.0 in | Wt 149.0 lb

## 2017-02-17 DIAGNOSIS — F0281 Dementia in other diseases classified elsewhere with behavioral disturbance: Secondary | ICD-10-CM

## 2017-02-17 DIAGNOSIS — R634 Abnormal weight loss: Secondary | ICD-10-CM | POA: Diagnosis not present

## 2017-02-17 DIAGNOSIS — F02818 Dementia in other diseases classified elsewhere, unspecified severity, with other behavioral disturbance: Secondary | ICD-10-CM

## 2017-02-17 DIAGNOSIS — G300 Alzheimer's disease with early onset: Secondary | ICD-10-CM

## 2017-02-17 DIAGNOSIS — R1031 Right lower quadrant pain: Secondary | ICD-10-CM

## 2017-02-17 LAB — POCT URINALYSIS DIP (MANUAL ENTRY)
Glucose, UA: NEGATIVE mg/dL
Nitrite, UA: NEGATIVE
Protein Ur, POC: 30 mg/dL — AB
Spec Grav, UA: 1.025 (ref 1.010–1.025)
Urobilinogen, UA: 0.2 E.U./dL
pH, UA: 5.5 (ref 5.0–8.0)

## 2017-02-17 NOTE — Progress Notes (Signed)
2/5/201912:09 PM  Tammy [name] 10/19/1945, 72 y.o. female [id number]  Chief Complaint  Patient presents with  . Abdominal Pain    haivng stomach painc having weightn loss. Husband says she does not eat and is very constipated    HPI:   Patient is a 72 y.o. female with past medical history significant for dementia who presents today due to her husbands continued concerns for decreased appetite and weight loss.   PCP, Dr Brigitte Pulse. Last seen on 02/02/17 Neurologist, [facility], last visit with Ward Givens NP, 12/29/16  Husband reports that she continues to complain occasionally of lower abdominal pain, and sometimes states that she needs to have a BM but nothing happens.  He states that she used to have a great appetite, they used to enjoy going out to eat, but now she eats very little and needs encouragement/reminding.   He denies her being nauseous, vomiting, increased flatulence or belching. Denies any fever or chills.  CT scan abd/pelvis 10/2016, stable - known right ovarian teratome, evaluated by gyn onc Abx xray 01/2017 - no constipation Labs unremarkable expect for decreased crt with elevated BUN  Depression screen [facility] 2/9 02/17/2017 02/02/2017 01/19/2017  Decreased Interest 0 0 0  Down, Depressed, Hopeless 0 0 0  PHQ - 2 Score 0 0 0    Allergies  Allergen Reactions  . Sulfa Antibiotics Palpitations    Prior to Admission medications   Medication Sig Start Date End Date Taking? Authorizing Provider  donepezil (ARICEPT) 10 MG tablet Take 1 tablet (10 mg total) by mouth at bedtime. 12/29/16  Yes Ward Givens, NP  mirtazapine (REMERON) 30 MG tablet Take 1 tablet (30 mg total) by mouth at bedtime. 02/02/17  Yes [name], MD  senna-docusate (SENOKOT-S) 8.6-50 MG tablet Take 2 tablets by mouth at bedtime. 02/02/17  Yes [name], MD    Past Medical History:  Diagnosis Date  . Alzheimer's disease 12/28/2014  . Dementia   . Headache disorder 12/28/2014    . Memory disturbance 12/28/2014    History reviewed. No pertinent surgical history.  Social History   Tobacco Use  . Smoking status: Never Smoker  . Smokeless tobacco: Never Used  Substance Use Topics  . Alcohol use: No    Family History  Problem Relation Age of Onset  . Heart disease Brother   . Heart attack Mother   . Dementia Mother     Review of Systems  Constitutional: Negative for chills and fever.  Respiratory: Negative for cough and shortness of breath.   Cardiovascular: Negative for chest pain, palpitations and leg swelling.  Gastrointestinal: Positive for abdominal pain. Negative for constipation, diarrhea, nausea and vomiting.  Genitourinary: Negative for dysuria and hematuria.     OBJECTIVE:  Blood pressure 130/84, pulse 100, temperature 98.4 F (36.9 C), temperature source Oral, height 5\' 7"  (1.702 m), weight 149 lb (67.6 kg), SpO2 100 %.  Wt Readings from Last 3 Encounters:  02/17/17 149 lb (67.6 kg)  02/02/17 153 lb 3.2 oz (69.5 kg)  01/19/17 163 lb 9.6 oz (74.2 kg)    Physical Exam  Constitutional: She is well-developed, well-nourished, and in no distress.  HENT:  Head: Normocephalic and atraumatic.  Mouth/Throat: Oropharynx is clear and moist. No oropharyngeal exudate.  Eyes: EOM are normal. Pupils are equal, round, and reactive to light. No scleral icterus.  Neck: Neck supple.  Cardiovascular: Normal rate, regular rhythm and normal heart sounds. Exam reveals no gallop and no friction rub.  No murmur heard. Pulmonary/Chest: Effort normal and breath sounds normal. She has no wheezes. She has no rales.  Abdominal: Soft. Bowel sounds are normal. She exhibits no distension. There is tenderness (RLQ). There is no rebound and no guarding.  Musculoskeletal: She exhibits no edema.  Neurological: She is alert. Gait normal.  Skin: Skin is warm and dry.  Psychiatric:  Flat affect, paucity of speech, minimal eye contact, follows commands with couching.      ASSESSMENT and PLAN  1. Early onset Alzheimer's disease with behavioral disturbance Discussed with husband that it seems there is progression of disease, discussed some ideas that might help increase caloric intake. Advised fu with neurology and PCP as scheduled.   2. Right lower quadrant abdominal pain - POCT urinalysis dipstick  3. Weight loss, unintentional  Return for PCP as scheduled.    Rutherford Guys, MD Primary Care at [place], [place] [address] Ph.  [phone] Fax [phone]

## 2017-02-17 NOTE — Patient Instructions (Addendum)
1. Focus on higher calories foods, whole fat diary, peanut butter, avocados, ice cream 2. Carnation instant breakfast smoothies 3. Small snacks 4-5 a day instead of 3 big meals, scheduled, couching/supervision with meals    IF you received an x-ray today, you will receive an invoice from [facility]. Please contact [facility] at [phone] with questions or concerns regarding your invoice.   IF you received labwork today, you will receive an invoice from [place]. Please contact [facility] at [phone] with questions or concerns regarding your invoice.   Our billing staff will not be able to assist you with questions regarding bills from these companies.  You will be contacted with the lab results as soon as they are available. The fastest way to get your results is to activate your My Chart account. Instructions are located on the last page of this paperwork. If you have not heard from [place] regarding the results in 2 weeks, please contact this office.

## 2017-02-23 ENCOUNTER — Other Ambulatory Visit: Payer: Self-pay

## 2017-02-23 ENCOUNTER — Encounter: Payer: Self-pay | Admitting: Family Medicine

## 2017-02-23 ENCOUNTER — Ambulatory Visit (INDEPENDENT_AMBULATORY_CARE_PROVIDER_SITE_OTHER): Payer: Medicare Other | Admitting: Family Medicine

## 2017-02-23 VITALS — BP 130/90 | HR 95 | Temp 98.3°F | Resp 18 | Ht 67.0 in | Wt 149.2 lb

## 2017-02-23 DIAGNOSIS — G300 Alzheimer's disease with early onset: Secondary | ICD-10-CM | POA: Diagnosis not present

## 2017-02-23 DIAGNOSIS — I10 Essential (primary) hypertension: Secondary | ICD-10-CM

## 2017-02-23 DIAGNOSIS — K5909 Other constipation: Secondary | ICD-10-CM

## 2017-02-23 DIAGNOSIS — F0281 Dementia in other diseases classified elsewhere with behavioral disturbance: Secondary | ICD-10-CM

## 2017-02-23 DIAGNOSIS — R634 Abnormal weight loss: Secondary | ICD-10-CM | POA: Insufficient documentation

## 2017-02-23 DIAGNOSIS — F02818 Dementia in other diseases classified elsewhere, unspecified severity, with other behavioral disturbance: Secondary | ICD-10-CM

## 2017-02-23 DIAGNOSIS — R413 Other amnesia: Secondary | ICD-10-CM

## 2017-02-23 DIAGNOSIS — N179 Acute kidney failure, unspecified: Secondary | ICD-10-CM

## 2017-02-23 DIAGNOSIS — R7303 Prediabetes: Secondary | ICD-10-CM

## 2017-02-23 DIAGNOSIS — E559 Vitamin D deficiency, unspecified: Secondary | ICD-10-CM

## 2017-02-23 LAB — CBC WITH DIFFERENTIAL/PLATELET
BASOS ABS: 0 10*3/uL (ref 0.0–0.2)
Basos: 1 %
EOS (ABSOLUTE): 0.1 10*3/uL (ref 0.0–0.4)
Eos: 2 %
HEMOGLOBIN: 11.9 g/dL (ref 11.1–15.9)
Hematocrit: 36.4 % (ref 34.0–46.6)
IMMATURE GRANS (ABS): 0 10*3/uL (ref 0.0–0.1)
IMMATURE GRANULOCYTES: 0 %
LYMPHS: 26 %
Lymphocytes Absolute: 1.2 10*3/uL (ref 0.7–3.1)
MCH: 28.3 pg (ref 26.6–33.0)
MCHC: 32.7 g/dL (ref 31.5–35.7)
MCV: 87 fL (ref 79–97)
MONOCYTES: 7 %
Monocytes Absolute: 0.4 10*3/uL (ref 0.1–0.9)
NEUTROS ABS: 3 10*3/uL (ref 1.4–7.0)
Neutrophils: 64 %
Platelets: 256 10*3/uL (ref 150–379)
RBC: 4.2 x10E6/uL (ref 3.77–5.28)
RDW: 15.3 % (ref 12.3–15.4)
WBC: 4.7 10*3/uL (ref 3.4–10.8)

## 2017-02-23 LAB — LIPID PANEL
CHOL/HDL RATIO: 3.6 ratio (ref 0.0–4.4)
Cholesterol, Total: 250 mg/dL — ABNORMAL HIGH (ref 100–199)
HDL: 69 mg/dL (ref >39)
LDL CALC: 164 mg/dL — AB (ref 0–99)
Triglycerides: 87 mg/dL (ref 0–149)
VLDL CHOLESTEROL CAL: 17 mg/dL (ref 5–40)

## 2017-02-23 LAB — COMPREHENSIVE METABOLIC PANEL
ALT: 68 IU/L — AB (ref 0–32)
AST: 32 IU/L (ref 0–40)
Albumin/Globulin Ratio: 1.2 (ref 1.2–2.2)
Albumin: 3.9 g/dL (ref 3.5–4.8)
Alkaline Phosphatase: 100 IU/L (ref 39–117)
BILIRUBIN TOTAL: 0.6 mg/dL (ref 0.0–1.2)
BUN/Creatinine Ratio: 9 — ABNORMAL LOW (ref 12–28)
BUN: 8 mg/dL (ref 8–27)
CALCIUM: 9.4 mg/dL (ref 8.7–10.3)
CHLORIDE: 105 mmol/L (ref 96–106)
CO2: 23 mmol/L (ref 20–29)
Creatinine, Ser: 0.91 mg/dL (ref 0.57–1.00)
GFR calc non Af Amer: 64 mL/min/{1.73_m2} (ref >59)
GFR, EST AFRICAN AMERICAN: 74 mL/min/{1.73_m2} (ref >59)
GLUCOSE: 99 mg/dL (ref 65–99)
Globulin, Total: 3.3 g/dL (ref 1.5–4.5)
Potassium: 3.2 mmol/L — ABNORMAL LOW (ref 3.5–5.2)
Sodium: 149 mmol/L — ABNORMAL HIGH (ref 134–144)
TOTAL PROTEIN: 7.2 g/dL (ref 6.0–8.5)

## 2017-02-23 MED ORDER — SENNOSIDES-DOCUSATE SODIUM 8.6-50 MG PO TABS: 4.0000 | ORAL_TABLET | Freq: Every day | ORAL | 1 refills | Status: DC

## 2017-02-23 NOTE — Progress Notes (Signed)
Subjective:  By signing my name below, I, [name], attest that this documentation has been prepared under the direction and in the presence of Delman Cheadle, MD. Electronically Signed: [name], [place]. 02/23/2017 , 10:29 AM .  Patient was seen in Room 1 .   Patient ID: Tammy [name], female    DOB: 06-10-1945, 72 y.o.   MRN: [id number] Chief Complaint  Patient presents with  . Weight Loss    Per pt's husband states she a regained a little bit of an appetite.  Marland Kitchen Appetite  . Follow-up   HPI Tammy [name] is a 72 y.o. female who presents to Primary Care at [facility] for follow up. She has lost approximately 15 lbs in the last month. Her weight stable from 1 week prior when she was seen by my colleague for RLQ abdominal pain.   Wt Readings from Last 3 Encounters:  02/23/17 149 lb 3.2 oz (67.7 kg)  02/17/17 149 lb (67.6 kg)  02/02/17 153 lb 3.2 oz (69.5 kg)   At last visit 3 weeks prior, had started patient on Senokot S 2 tabs QHS for chronic constipation and increased mirtazapine 15mg  -> 30mg . Stopped HCTZ and continued Aricept.   Her husband states patient's sleep has improved, as well as appetite, from last week. She reports bowel movement 2-3 times a week. Her daughter worked taking care of people with disabilities; she's been able to help at home. However, she does commute from [place], [place].   She is brought in by her husband today, Sterling Big, who provides all of the hx.  Pt unable to answer any questions though can follow instructions with some guidance   Past Medical History:  Diagnosis Date  . Alzheimer's disease 12/28/2014  . Dementia   . Headache disorder 12/28/2014  . Memory disturbance 12/28/2014   History reviewed. No pertinent surgical history. Prior to Admission medications   Medication Sig Start Date End Date Taking? Authorizing Provider  donepezil (ARICEPT) 10 MG tablet Take 1 tablet (10 mg total) by mouth at bedtime. 12/29/16   Ward Givens, NP  mirtazapine  (REMERON) 30 MG tablet Take 1 tablet (30 mg total) by mouth at bedtime. 02/02/17   [name], MD  senna-docusate (SENOKOT-S) 8.6-50 MG tablet Take 2 tablets by mouth at bedtime. 02/02/17   [name], MD   Allergies  Allergen Reactions  . Sulfa Antibiotics Palpitations   Family History  Problem Relation Age of Onset  . Heart disease Brother   . Heart attack Mother   . Dementia Mother    Social History   Socioeconomic History  . Marital status: Married    Spouse name: [name]  . Number of children: 3  . Years of education: 15  . Highest education level: None  Social Needs  . Financial resource strain: None  . Food insecurity - worry: None  . Food insecurity - inability: None  . Transportation needs - medical: None  . Transportation needs - non-medical: None  Occupational History  . Occupation: retired  Tobacco Use  . Smoking status: Never Smoker  . Smokeless tobacco: Never Used  Substance and Sexual Activity  . Alcohol use: No  . Drug use: No  . Sexual activity: Yes  Other Topics Concern  . None  Social History Narrative   Married with 3 children   Right handed   8th grade   1 cup coffee daily   Depression screen [facility] 2/9 02/23/2017 02/17/2017 02/02/2017 01/19/2017 01/14/2017  Decreased  Interest 0 0 0 0 0  Down, Depressed, Hopeless 0 0 0 0 0  PHQ - 2 Score 0 0 0 0 0    Review of Systems  Constitutional: Positive for appetite change (improved) and unexpected weight change (loss). Negative for chills, fatigue and fever.  Respiratory: Negative for cough.   Gastrointestinal: Negative for constipation, diarrhea, nausea and vomiting.  Skin: Negative for rash and wound.  Neurological: Negative for dizziness, weakness and headaches.       Objective:   Physical Exam  Constitutional: She is oriented to person, place, and time. She appears well-developed and well-nourished. No distress.  HENT:  Head: Normocephalic and atraumatic.  Eyes: EOM are normal. Pupils are equal,  round, and reactive to light.  Neck: Neck supple.  Cardiovascular: Normal rate, regular rhythm and normal heart sounds.  Pulmonary/Chest: Effort normal and breath sounds normal. No respiratory distress. She has no wheezes.  Abdominal: Bowel sounds are normal. There is tenderness in the right upper quadrant and right lower quadrant. There is guarding.  Diffuse tenderness throughout  Musculoskeletal: Normal range of motion.  Neurological: She is alert and oriented to person, place, and time.  Skin: Skin is warm and dry.  Psychiatric: She has a normal mood and affect. Her behavior is normal.  Nursing note and vitals reviewed.   BP 130/90 (BP Location: Left Arm, Patient Position: Sitting, Cuff Size: Normal)   Pulse 95   Temp 98.3 F (36.8 C) (Oral)   Resp 18   Ht 5\' 7"  (1.702 m)   Wt 149 lb 3.2 oz (67.7 kg)   SpO2 99%   BMI 23.37 kg/m      Assessment & Plan:   1. Memory disturbance   2. Unintentional weight loss of more than 10 pounds in 90 days   3. Acute renal failure, unspecified acute renal failure type ([place])   4. Early onset Alzheimer's disease with behavioral disturbance - I suspect pt's Alzheimer's has sig progressed and is now causing some loss of interest/appetite in pt.  Husband would like to learn more about hospice services and when/if these would be appropriate so referral placed Currently, daughter Andrey Farmer used to work with special needs individuals is driving in from [place] to help with ADLs - bathing, grooming, cooking, etc - Sterling Big has heard there is a way to get insurance to compensate their daughter for being a home health aide rather than [name] being provided a stranger - will place C3 referral to see if we can guide Sterling Big in looking into this - gave # to DSS and DHS for him to try to ask. Currently perceives that last wk was better than prior for pt so will cont on mirtazapine 30 (dose recently increased from 15 3 wks prior) and donepazil 10  5. Vitamin D  deficiency   6. Prediabetes   7. Essential hypertension   8. Unintentional weight loss of 7.5% body weight or less within 3 months   9. Unintentional weight loss of more than 5% body weight within 1 month   10. Unintentional weight loss of more than 10% body weight within 6 months   11. Chronic constipation - BM 2-3x/wk on Senokot S 2 tabs qhs so double to 4 tabs qhs.  Concern that chronic constipation could be exacerbating decreased appetite/weight loss.    Orders Placed This Encounter  Procedures  . Comprehensive metabolic panel  . CBC with Differential/Platelet  . Lipid panel  . Ambulatory referral to Connected Care  Referral Priority:   Routine    Referral Type:   Consultation    Referral Reason:   Specialty Services Required    Number of Visits Requested:   1  . Ambulatory referral to Hospice    Referral Priority:   Routine    Referral Type:   Consultation    Referral Reason:   Specialty Services Required    Requested Specialty:   Hospice Services    Number of Visits Requested:   1    Meds ordered this encounter  Medications  . senna-docusate (SENOKOT-S) 8.6-50 MG tablet    Sig: Take 4 tablets by mouth at bedtime.    Dispense:  360 tablet    Refill:  1    I personally performed the services described in this documentation, which was scribed in my presence. The recorded information has been reviewed and considered, and addended by me as needed.   Delman Cheadle, M.D.  Primary Care at [facility] [address] [place], [place] [address] [phone] phone [phone] fax  02/26/17 2:31 AM

## 2017-02-23 NOTE — Patient Instructions (Addendum)
State-County Special Assistance, In Home (Help to stay in your home). The State/County Special Assistance In-Home Program for Adults (SA/IH) provides a cash supplement to low-income individuals who are at risk of entering a residential facility. SA/IH provides additional support services and income to individuals who would prefer to live at home. Case managers at the [facility] of social services conduct comprehensive assessments to identify how certain factors would affect an individual's ability to live at home. The case managers work directly with the recipients, families and other caregivers to develop a care plan that enables the recipient to live at home.  contact [place] [phone] for more information on this program.  ----------------------------------------------------------------------------  [facility] [phone] NA services 2-3x/wk to assist with bathing, personal care, linen change,, light meal preparation, trash removal. SN annual assessment and Quarterly Supervisory visits. Goal improve and maintain good skin integrity, increase safety with ADLs. Needs to be connected wiht other community services to assist with meeting needs as well.     IF you received an x-ray today, you will receive an invoice from [facility]. Please contact [facility] at [phone] with questions or concerns regarding your invoice.   IF you received labwork today, you will receive an invoice from [facility]. Please contact [facility] at [phone] with questions or concerns regarding your invoice.   Our billing staff will not be able to assist you with questions regarding bills from these companies.  You will be contacted with the lab results as soon as they are available. The fastest way to get your results is to activate your My Chart account. Instructions are located on the  last page of this paperwork. If you have not heard from [place] regarding the results in 2 weeks, please contact this office.     High-Fiber Diet Fiber, also called dietary fiber, is a type of carbohydrate found in fruits, vegetables, whole grains, and beans. A high-fiber diet can have many health benefits. Your health care provider may recommend a high-fiber diet to help:  Prevent constipation. Fiber can make your bowel movements more regular.  Lower your cholesterol.  Relieve hemorrhoids, uncomplicated diverticulosis, or irritable bowel syndrome.  Prevent overeating as part of a weight-loss plan.  Prevent heart disease, type 2 diabetes, and certain cancers.  What is my plan? The recommended daily intake of fiber includes:  38 grams for men under age 13.  28 grams for men over age 12.  58 grams for women under age 69.  56 grams for women over age 68.  You can get the recommended daily intake of dietary fiber by eating a variety of fruits, vegetables, grains, and beans. Your health care provider may also recommend a fiber supplement if it is not possible to get enough fiber through your diet. What do I need to know about a high-fiber diet?  Fiber supplements have not been widely studied for their effectiveness, so it is better to get fiber through food sources.  Always check the fiber content on thenutrition facts label of any prepackaged food. Look for foods that contain at least 5 grams of fiber per serving.  Ask your dietitian if you have questions about specific foods that are related to your condition, especially if those foods are not listed in the following section.  Increase your daily fiber consumption gradually. Increasing your intake of dietary fiber too quickly may cause bloating, cramping, or gas.  Drink plenty of water. Water helps you  to digest fiber. What foods can I eat? Grains Whole-grain breads. Multigrain cereal. Oats and oatmeal. Brown rice. Barley. Bulgur  wheat. Sawmill. Bran muffins. Popcorn. Rye wafer crackers. Vegetables Sweet potatoes. Spinach. Kale. Artichokes. Cabbage. Broccoli. Green peas. Carrots. Squash. Fruits Berries. Pears. Apples. Oranges. Avocados. Prunes and raisins. Dried figs. Meats and Other Protein Sources Navy, kidney, pinto, and soy beans. Split peas. Lentils. Nuts and seeds. Dairy Fiber-fortified yogurt. Beverages Fiber-fortified soy milk. Fiber-fortified orange juice. Other Fiber bars. The items listed above may not be a complete list of recommended foods or beverages. Contact your dietitian for more options. What foods are not recommended? Grains White bread. Pasta made with refined flour. White rice. Vegetables Fried potatoes. Canned vegetables. Well-cooked vegetables. Fruits Fruit juice. Cooked, strained fruit. Meats and Other Protein Sources Fatty cuts of meat. Fried Sales executive or fried fish. Dairy Milk. Yogurt. Cream cheese. Sour cream. Beverages Soft drinks. Other Cakes and pastries. Butter and oils. The items listed above may not be a complete list of foods and beverages to avoid. Contact your dietitian for more information. What are some tips for including high-fiber foods in my diet?  Eat a wide variety of high-fiber foods.  Make sure that half of all grains consumed each day are whole grains.  Replace breads and cereals made from refined flour or white flour with whole-grain breads and cereals.  Replace white rice with brown rice, bulgur wheat, or millet.  Start the day with a breakfast that is high in fiber, such as a cereal that contains at least 5 grams of fiber per serving.  Use beans in place of meat in soups, salads, or pasta.  Eat high-fiber snacks, such as berries, raw vegetables, nuts, or popcorn. This information is not intended to replace advice given to you by your health care provider. Make sure you discuss any questions you have with your health care provider. Document Released:  12/30/2004 Document Revised: 06/07/2015 Document Reviewed: 06/14/2013 Elsevier Interactive Patient Education  [employer].

## 2017-02-26 ENCOUNTER — Telehealth: Payer: Self-pay

## 2017-02-26 NOTE — Telephone Encounter (Signed)
Community resource referral received from Dr. Brigitte Pulse. Left message to discuss with pt/pt's family.    Josepha Pigg, B.A.  Care Guide - Primary Care at [place]

## 2017-02-27 ENCOUNTER — Telehealth: Payer: Self-pay | Admitting: Family Medicine

## 2017-02-27 NOTE — Telephone Encounter (Signed)
Pt given results per notes of Dr. Brigitte Pulse on 02/26/17.  Husband verbalized understanding of recommendations to increase potassium in pt's diet, and noted the food sources suggested per Dr. Brigitte Pulse. Unable to document in result note, due to result note not being routed to [facility].

## 2017-03-05 ENCOUNTER — Ambulatory Visit (INDEPENDENT_AMBULATORY_CARE_PROVIDER_SITE_OTHER): Payer: Medicare Other | Admitting: Family Medicine

## 2017-03-05 ENCOUNTER — Encounter: Payer: Self-pay | Admitting: Family Medicine

## 2017-03-05 ENCOUNTER — Other Ambulatory Visit: Payer: Self-pay

## 2017-03-05 VITALS — BP 126/80 | HR 80 | Temp 98.2°F | Resp 16 | Wt 151.2 lb

## 2017-03-05 DIAGNOSIS — R600 Localized edema: Secondary | ICD-10-CM | POA: Diagnosis not present

## 2017-03-05 DIAGNOSIS — R7989 Other specified abnormal findings of blood chemistry: Secondary | ICD-10-CM

## 2017-03-05 DIAGNOSIS — Z86718 Personal history of other venous thrombosis and embolism: Secondary | ICD-10-CM

## 2017-03-05 DIAGNOSIS — G300 Alzheimer's disease with early onset: Secondary | ICD-10-CM | POA: Diagnosis not present

## 2017-03-05 DIAGNOSIS — Z86711 Personal history of pulmonary embolism: Secondary | ICD-10-CM

## 2017-03-05 DIAGNOSIS — F0281 Dementia in other diseases classified elsewhere with behavioral disturbance: Secondary | ICD-10-CM

## 2017-03-05 DIAGNOSIS — F02818 Dementia in other diseases classified elsewhere, unspecified severity, with other behavioral disturbance: Secondary | ICD-10-CM

## 2017-03-05 LAB — D-DIMER, QUANTITATIVE: D-DIMER: 12.78 mg/L FEU — ABNORMAL HIGH (ref 0.00–0.49)

## 2017-03-05 MED ORDER — FUROSEMIDE 20 MG PO TABS: 20.0000 mg | ORAL_TABLET | Freq: Every day | ORAL | 3 refills | Status: DC | PRN

## 2017-03-05 MED ORDER — FUROSEMIDE 20 MG PO TABS: 20.0000 mg | ORAL_TABLET | Freq: Every day | ORAL | 3 refills | Status: DC

## 2017-03-05 NOTE — Progress Notes (Signed)
Subjective:  By signing my name below, I, Tammy [name], attest that this documentation has been prepared under the direction and in the presence of Delman Cheadle, MD Electronically Signed: Ladene Artist, ED Scribe 03/05/2017 at 3:55 PM.   Patient ID: Tammy [name], female    DOB: 1945/08/13, 72 y.o.   MRN: [id number]  Chief Complaint  Patient presents with  . Edema    BOTH x 2 days   HPI Tammy [name] "Tammy [name]" is a 72 y.o. female with a PMHx sig for early onset Alzheimer's dementia and RLext femoral DVT/bilateral segmental PEs in all lobes 1 yr prior 02/2016, who presents to Primary Care at [facility] complaining of bilateral leg swelling x 2 days. Pt's husband, [name], provides the hx - he states pt sleeps in a recliner which is reclined with her feet elevated, however, he has not noticed a difference in swelling in the mornings. however, she has been wearing compression socks.  Denies appetite change that she will eat some but she has to be bribed with sweets to eat a good meal. However, weight loss has stalled and a little gained back.  Doesn't ever sleep well.  3 months ago, pt presented with same bilateral pedal edema seen by one of my colleages. BP was also elevated at that time. Started on HCTZ 25 qd and symptoms resolved. Pt's HCTZ was stopped ~1 month ago by myself however as  BP and K were low.   Pt had a R femoral DVT and PE 1 yr prior 03/2016. [place] repeated 10/09/16 showed all clot had resolved.  Past Medical History:  Diagnosis Date  . Alzheimer's disease 12/28/2014  . Dementia   . Headache disorder 12/28/2014  . Memory disturbance 12/28/2014   Current Outpatient Medications on File Prior to Visit  Medication Sig Dispense Refill  . donepezil (ARICEPT) 10 MG tablet Take 1 tablet (10 mg total) by mouth at bedtime. 30 tablet 11  . mirtazapine (REMERON) 30 MG tablet Take 1 tablet (30 mg total) by mouth at bedtime. 30 tablet 2  . senna-docusate (SENOKOT-S) 8.6-50 MG tablet Take 4 tablets by  mouth at bedtime. 360 tablet 1   No current facility-administered medications on file prior to visit.    Allergies  Allergen Reactions  . Sulfa Antibiotics Palpitations   No past surgical history on file. Family History  Problem Relation Age of Onset  . Heart disease Brother   . Heart attack Mother   . Dementia Mother    Social History   Socioeconomic History  . Marital status: Married    Spouse name: [name]  . Number of children: 3  . Years of education: 72  . Highest education level: None  Social Needs  . Financial resource strain: None  . Food insecurity - worry: None  . Food insecurity - inability: None  . Transportation needs - medical: None  . Transportation needs - non-medical: None  Occupational History  . Occupation: retired  Tobacco Use  . Smoking status: Never Smoker  . Smokeless tobacco: Never Used  Substance and Sexual Activity  . Alcohol use: No  . Drug use: No  . Sexual activity: Yes  Other Topics Concern  . None  Social History Narrative   Married with 3 children   Right handed   8th grade   1 cup coffee daily   Depression screen [facility] 2/9 03/05/2017 02/23/2017 02/17/2017 02/02/2017 01/19/2017  Decreased Interest 0 0 0 0 0  Down, Depressed, Hopeless 0 0 0  0 0  PHQ - 2 Score 0 0 0 0 0    Review of Systems  Unable to perform ROS: Dementia  Constitutional: Negative for appetite change.  Cardiovascular: Positive for leg swelling.     Objective:   Physical Exam  Constitutional: She is oriented to person, place, and time. She appears well-developed and well-nourished. No distress.  HENT:  Head: Normocephalic and atraumatic.  Eyes: Conjunctivae and EOM are normal.  Neck: Neck supple. No tracheal deviation present.  Cardiovascular: Normal rate, regular rhythm, S1 normal, S2 normal and normal heart sounds.  Pulmonary/Chest: Effort normal and breath sounds normal. No respiratory distress.  Musculoskeletal: Normal range of motion. She exhibits edema.    Wearing compression hose but bilateral feet notably puffy w/ nonpitting edema. After hose removed: 1+ pitting edema, L worse than R  Neurological: She is alert and oriented to person, place, and time.  Skin: Skin is warm and dry.  Psychiatric: She has a normal mood and affect. Her behavior is normal.  Nursing note and vitals reviewed.  BP 126/80 (BP Location: Right Arm, Patient Position: Sitting, Cuff Size: Large)   Pulse 80   Temp 98.2 F (36.8 C) (Oral)   Resp 16   Wt 151 lb 3.2 oz (68.6 kg)   SpO2 99%   BMI 23.68 kg/m     Assessment & Plan:   1. Pedal edema - Presented w/ new onset B lower ext edema 3 mos ago, resolved on hctz which was stopped last mo due to hypokalemia and hypotension. Likely due to sig decreased protein and water intake so will try  furosemide 20 qd x 1 wk, then prn edema - eat high K foods on day of furosemide.  T Due to recent h/o DVT I think we need to check a d-dimer again considering pt had relatively minor sxs at that time consider she had multiple segmental branch pulmonary emboli involving all lobes. Since then her Alzheimer's has sig progressed so I am not sure how likely she would be to volunteer any sxs she was having of DVT/PE - pt always denies all sxs when asked in the office.  2. History of deep vein thrombosis (DVT) of lower extremity - 1 yr prior thought provoked due to car-ride on vacation out of state.  After 6 mos, repeat RLExt [place] confirmed clot resolved so xarelto d/c'd.    3. History of pulmonary embolism   4. 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 Comprehensive metabolic panel  . D-dimer, quantitative (not at [facility])    Meds ordered this encounter  Medications  . DISCONTD: furosemide (LASIX) 20 MG tablet    Sig: Take 1 tablet (20 mg total) by mouth daily.    Dispense:  30 tablet     Refill:  3    As needed for swelling  . furosemide (LASIX) 20 MG tablet    Sig: Take 1 tablet (20 mg total) by mouth daily as needed for edema.    Dispense:  30 tablet    Refill:  3    Please d/c rx I just send over that was "qd". Want sig on bottle to specify "prn".    I personally performed the services described in this documentation, which was scribed in my presence. The recorded information has been reviewed and considered, and addended by me as  needed.   Delman Cheadle, M.D.  Primary Care at [facility] [address] [place], [place] [address] [phone] phone [phone] fax  03/06/17 8:49 PM

## 2017-03-05 NOTE — Patient Instructions (Addendum)
We are doing a blood test to check for a blood clot. If the blood test is positive, we will send you tomorrow for an ultrasound of the legs.  Take the furosemide medication whenever you notice any swelling and make sure you eat plenty of high potassium foods along with it.  Take the furosemide once a day for the next week approximately to get rid of the current swelling.   IF you received an x-ray today, you will receive an invoice from [facility]. Please contact [facility] at [phone] with questions or concerns regarding your invoice.   IF you received labwork today, you will receive an invoice from [facility]. Please contact [facility] at [phone] with questions or concerns regarding your invoice.   Our billing staff will not be able to assist you with questions regarding bills from these companies.  You will be contacted with the lab results as soon as they are available. The fastest way to get your results is to activate your My Chart account. Instructions are located on the last page of this paperwork. If you have not heard from [place] regarding the results in 2 weeks, please contact this office.     Peripheral Edema Peripheral edema is swelling that is caused by a buildup of fluid. Peripheral edema most often affects the lower legs, ankles, and feet. It can also develop in the arms, hands, and face. The area of the body that has peripheral edema will look swollen. It may also feel heavy or warm. Your clothes may start to feel tight. Pressing on the area may make a temporary dent in your skin. You may not be able to move your arm or leg as much as usual. There are many causes of peripheral edema. It can be a complication of other diseases, such as congestive heart failure, kidney disease, or a problem with your blood circulation. It also can be a side effect of certain medicines. It often happens to women during pregnancy. Sometimes, the cause is not known. Treating the  underlying condition is often the only treatment for peripheral edema. Follow these instructions at home: Pay attention to any changes in your symptoms. Take these actions to help with your discomfort:  Raise (elevate) your legs while you are sitting or lying down.  Move around often to prevent stiffness and to lessen swelling. Do not sit or stand for long periods of time.  Wear support stockings as told by your health care provider.  Follow instructions from your health care provider about limiting salt (sodium) in your diet. Sometimes eating less salt can reduce swelling.  Take over-the-counter and prescription medicines only as told by your health care provider. Your health care provider may prescribe medicine to help your body get rid of excess water (diuretic).  Keep all follow-up visits as told by your health care provider. This is important.  Contact a health care provider if:  You have a fever.  Your edema starts suddenly or is getting worse, especially if you are pregnant or have a medical condition.  You have swelling in only one leg.  You have increased swelling and pain in your legs. Get help right away if:  You develop shortness of breath, especially when you are lying down.  You have pain in your chest or abdomen.  You feel weak.  You faint. This information is not intended to replace advice given to you by your health care provider. Make sure you discuss any questions you have with your health  care provider. Document Released: 02/07/2004 Document Revised: 06/04/2015 Document Reviewed: 07/12/2014 Elsevier Interactive Patient Education  [employer].

## 2017-03-06 ENCOUNTER — Encounter: Payer: Self-pay | Admitting: Family Medicine

## 2017-03-06 DIAGNOSIS — Z86718 Personal history of other venous thrombosis and embolism: Secondary | ICD-10-CM | POA: Insufficient documentation

## 2017-03-06 DIAGNOSIS — Z86711 Personal history of pulmonary embolism: Secondary | ICD-10-CM | POA: Insufficient documentation

## 2017-03-06 LAB — COMPREHENSIVE METABOLIC PANEL
A/G RATIO: 1.2 (ref 1.2–2.2)
ALBUMIN: 3.9 g/dL (ref 3.5–4.8)
ALT: 49 IU/L — ABNORMAL HIGH (ref 0–32)
AST: 26 IU/L (ref 0–40)
Alkaline Phosphatase: 109 IU/L (ref 39–117)
BUN / CREAT RATIO: 18 (ref 12–28)
BUN: 14 mg/dL (ref 8–27)
Bilirubin Total: 0.3 mg/dL (ref 0.0–1.2)
CALCIUM: 9.6 mg/dL (ref 8.7–10.3)
CO2: 25 mmol/L (ref 20–29)
Chloride: 108 mmol/L — ABNORMAL HIGH (ref 96–106)
Creatinine, Ser: 0.8 mg/dL (ref 0.57–1.00)
GFR, EST AFRICAN AMERICAN: 86 mL/min/{1.73_m2} (ref >59)
GFR, EST NON AFRICAN AMERICAN: 75 mL/min/{1.73_m2} (ref >59)
GLOBULIN, TOTAL: 3.2 g/dL (ref 1.5–4.5)
Glucose: 89 mg/dL (ref 65–99)
Potassium: 3.4 mmol/L — ABNORMAL LOW (ref 3.5–5.2)
SODIUM: 148 mmol/L — AB (ref 134–144)
TOTAL PROTEIN: 7.1 g/dL (ref 6.0–8.5)

## 2017-03-07 ENCOUNTER — Other Ambulatory Visit: Payer: Self-pay

## 2017-03-07 ENCOUNTER — Emergency Department (HOSPITAL_COMMUNITY): Payer: Medicare Other

## 2017-03-07 ENCOUNTER — Observation Stay (HOSPITAL_COMMUNITY)
Admission: EM | Admit: 2017-03-07 | Discharge: 2017-03-08 | Disposition: A | Discharge status: Home / Self Care | Payer: Medicare Other | Attending: Internal Medicine | Admitting: Internal Medicine

## 2017-03-07 ENCOUNTER — Encounter (HOSPITAL_COMMUNITY): Payer: Self-pay | Admitting: Emergency Medicine

## 2017-03-07 ENCOUNTER — Ambulatory Visit (HOSPITAL_BASED_OUTPATIENT_CLINIC_OR_DEPARTMENT_OTHER)
Admission: RE | Admit: 2017-03-07 | Discharge: 2017-03-07 | Disposition: A | Discharge status: Home / Self Care | Payer: Medicare Other | Source: Ambulatory Visit | Attending: Family Medicine | Admitting: Family Medicine

## 2017-03-07 ENCOUNTER — Telehealth: Payer: Self-pay

## 2017-03-07 DIAGNOSIS — Z882 Allergy status to sulfonamides status: Secondary | ICD-10-CM | POA: Insufficient documentation

## 2017-03-07 DIAGNOSIS — Z7902 Long term (current) use of antithrombotics/antiplatelets: Secondary | ICD-10-CM | POA: Diagnosis not present

## 2017-03-07 DIAGNOSIS — I2699 Other pulmonary embolism without acute cor pulmonale: Principal | ICD-10-CM | POA: Insufficient documentation

## 2017-03-07 DIAGNOSIS — D649 Anemia, unspecified: Secondary | ICD-10-CM | POA: Insufficient documentation

## 2017-03-07 DIAGNOSIS — K7689 Other specified diseases of liver: Secondary | ICD-10-CM | POA: Insufficient documentation

## 2017-03-07 DIAGNOSIS — I82442 Acute embolism and thrombosis of left tibial vein: Secondary | ICD-10-CM | POA: Diagnosis not present

## 2017-03-07 DIAGNOSIS — G300 Alzheimer's disease with early onset: Secondary | ICD-10-CM | POA: Diagnosis not present

## 2017-03-07 DIAGNOSIS — Z79899 Other long term (current) drug therapy: Secondary | ICD-10-CM | POA: Diagnosis not present

## 2017-03-07 DIAGNOSIS — R7989 Other specified abnormal findings of blood chemistry: Secondary | ICD-10-CM | POA: Diagnosis not present

## 2017-03-07 DIAGNOSIS — R600 Localized edema: Secondary | ICD-10-CM

## 2017-03-07 DIAGNOSIS — I82409 Acute embolism and thrombosis of unspecified deep veins of unspecified lower extremity: Secondary | ICD-10-CM | POA: Diagnosis present

## 2017-03-07 DIAGNOSIS — E875 Hyperkalemia: Secondary | ICD-10-CM | POA: Insufficient documentation

## 2017-03-07 DIAGNOSIS — F0280 Dementia in other diseases classified elsewhere without behavioral disturbance: Secondary | ICD-10-CM | POA: Diagnosis not present

## 2017-03-07 DIAGNOSIS — Z86718 Personal history of other venous thrombosis and embolism: Secondary | ICD-10-CM | POA: Diagnosis not present

## 2017-03-07 DIAGNOSIS — Z86711 Personal history of pulmonary embolism: Secondary | ICD-10-CM | POA: Diagnosis not present

## 2017-03-07 DIAGNOSIS — R7303 Prediabetes: Secondary | ICD-10-CM | POA: Insufficient documentation

## 2017-03-07 DIAGNOSIS — I10 Essential (primary) hypertension: Secondary | ICD-10-CM | POA: Insufficient documentation

## 2017-03-07 DIAGNOSIS — E43 Unspecified severe protein-calorie malnutrition: Secondary | ICD-10-CM | POA: Diagnosis not present

## 2017-03-07 DIAGNOSIS — E876 Hypokalemia: Secondary | ICD-10-CM | POA: Insufficient documentation

## 2017-03-07 DIAGNOSIS — G309 Alzheimer's disease, unspecified: Secondary | ICD-10-CM | POA: Diagnosis not present

## 2017-03-07 DIAGNOSIS — M7989 Other specified soft tissue disorders: Secondary | ICD-10-CM | POA: Diagnosis not present

## 2017-03-07 LAB — BASIC METABOLIC PANEL
ANION GAP: 21 — AB (ref 5–15)
BUN: 10 mg/dL (ref 6–20)
CALCIUM: 8.6 mg/dL — AB (ref 8.9–10.3)
CO2: 22 mmol/L (ref 22–32)
CREATININE: 0.82 mg/dL (ref 0.44–1.00)
Chloride: 101 mmol/L (ref 101–111)
GLUCOSE: 94 mg/dL (ref 65–99)
Potassium: 3 mmol/L — ABNORMAL LOW (ref 3.5–5.1)
Sodium: 144 mmol/L (ref 135–145)

## 2017-03-07 LAB — CBC
HCT: 32.4 % — ABNORMAL LOW (ref 36.0–46.0)
Hemoglobin: 10.5 g/dL — ABNORMAL LOW (ref 12.0–15.0)
MCH: 28.1 pg (ref 26.0–34.0)
MCHC: 32.4 g/dL (ref 30.0–36.0)
MCV: 86.6 fL (ref 78.0–100.0)
PLATELETS: 243 10*3/uL (ref 150–400)
RBC: 3.74 MIL/uL — ABNORMAL LOW (ref 3.87–5.11)
RDW: 15.4 % (ref 11.5–15.5)
WBC: 5 10*3/uL (ref 4.0–10.5)

## 2017-03-07 LAB — TSH: TSH: 1.576 u[IU]/mL (ref 0.350–4.500)

## 2017-03-07 LAB — RETICULOCYTES
RBC.: 3.8 MIL/uL — ABNORMAL LOW (ref 3.87–5.11)
RETIC COUNT ABSOLUTE: 53.2 10*3/uL (ref 19.0–186.0)
Retic Ct Pct: 1.4 % (ref 0.4–3.1)

## 2017-03-07 LAB — POC OCCULT BLOOD, ED: Fecal Occult Bld: NEGATIVE

## 2017-03-07 LAB — IRON AND TIBC
IRON: 57 ug/dL (ref 28–170)
Saturation Ratios: 21 % (ref 10.4–31.8)
TIBC: 266 ug/dL (ref 250–450)
UIBC: 209 ug/dL

## 2017-03-07 LAB — APTT: APTT: 28 s (ref 24–36)

## 2017-03-07 LAB — VITAMIN B12: Vitamin B-12: 333 pg/mL (ref 180–914)

## 2017-03-07 LAB — FERRITIN: FERRITIN: 313 ng/mL — AB (ref 11–307)

## 2017-03-07 LAB — PROTIME-INR
INR: 1.03
Prothrombin Time: 13.4 seconds (ref 11.4–15.2)

## 2017-03-07 LAB — FOLATE: Folate: 18.2 ng/mL (ref >5.9)

## 2017-03-07 MED ORDER — HEPARIN BOLUS VIA INFUSION
4500.0000 [IU] | Freq: Once | INTRAVENOUS | Status: AC
  Administered 2017-03-07: 4500 [IU] via INTRAVENOUS
  Filled 2017-03-07: qty 4500

## 2017-03-07 MED ORDER — IOPAMIDOL (ISOVUE-370) INJECTION 76%
INTRAVENOUS | Status: AC
  Administered 2017-03-07: 100 mL
  Filled 2017-03-07: qty 100

## 2017-03-07 MED ORDER — ACETAMINOPHEN 650 MG RE SUPP: 650.0000 mg | Freq: Four times a day (QID) | RECTAL | Status: DC | PRN

## 2017-03-07 MED ORDER — ONDANSETRON HCL 4 MG PO TABS: 4.0000 mg | ORAL_TABLET | Freq: Four times a day (QID) | ORAL | Status: DC | PRN

## 2017-03-07 MED ORDER — POTASSIUM CHLORIDE CRYS ER 20 MEQ PO TBCR
60.0000 meq | EXTENDED_RELEASE_TABLET | Freq: Once | ORAL | Status: AC
  Administered 2017-03-07: 60 meq via ORAL
  Filled 2017-03-07: qty 3

## 2017-03-07 MED ORDER — MIRTAZAPINE 7.5 MG PO TABS
30.0000 mg | ORAL_TABLET | Freq: Every day | ORAL | Status: DC
  Administered 2017-03-07: 30 mg via ORAL
  Filled 2017-03-07: qty 4

## 2017-03-07 MED ORDER — HEPARIN BOLUS VIA INFUSION
4500.0000 [IU] | Freq: Once | INTRAVENOUS | Status: DC
  Filled 2017-03-07: qty 4500

## 2017-03-07 MED ORDER — SENNOSIDES-DOCUSATE SODIUM 8.6-50 MG PO TABS
4.0000 | ORAL_TABLET | Freq: Every day | ORAL | Status: DC
  Administered 2017-03-07: 4 via ORAL
  Filled 2017-03-07 (×2): qty 4

## 2017-03-07 MED ORDER — HEPARIN (PORCINE) IN NACL 100-0.45 UNIT/ML-% IJ SOLN
1050.0000 [IU]/h | INTRAMUSCULAR | Status: DC
  Administered 2017-03-07: 1200 [IU]/h via INTRAVENOUS
  Filled 2017-03-07: qty 250

## 2017-03-07 MED ORDER — IOPAMIDOL (ISOVUE-300) INJECTION 61%
INTRAVENOUS | Status: AC
  Filled 2017-03-07: qty 100

## 2017-03-07 MED ORDER — ONDANSETRON HCL 4 MG/2ML IJ SOLN: 4.0000 mg | Freq: Four times a day (QID) | INTRAMUSCULAR | Status: DC | PRN

## 2017-03-07 MED ORDER — DONEPEZIL HCL 10 MG PO TABS
10.0000 mg | ORAL_TABLET | Freq: Every day | ORAL | Status: DC
  Administered 2017-03-07: 10 mg via ORAL
  Filled 2017-03-07: qty 1

## 2017-03-07 MED ORDER — ACETAMINOPHEN 325 MG PO TABS: 650.0000 mg | ORAL_TABLET | Freq: Four times a day (QID) | ORAL | Status: DC | PRN

## 2017-03-07 MED ORDER — HEPARIN (PORCINE) IN NACL 100-0.45 UNIT/ML-% IJ SOLN
1200.0000 [IU]/h | INTRAMUSCULAR | Status: DC
  Filled 2017-03-07: qty 250

## 2017-03-07 MED ORDER — ENSURE ENLIVE PO LIQD: 237.0000 mL | Freq: Two times a day (BID) | ORAL | Status: DC

## 2017-03-07 NOTE — ED Provider Notes (Signed)
[facility] PROGRESSIVE CARE Provider Note   CSN: [id number] Arrival date & time: 03/07/17  1118     History   Chief Complaint No chief complaint on file.   HPI Tammy [name] is a 72 y.o. female with history of pulmonary embolism and DVT, as well as early Alzheimer's dementia who presents with a 3-day history of bilateral leg swelling, left greater than right.  Patient denies any pain in her legs.  Patient is not currently on Xarelto, as her DVT had cleared and her doctor told her to stop.  She saw her doctor yesterday who sent her for DVT ultrasound, which showed positive DVT in the left lower leg.  She was sent here for CTA of the chest to rule out pulmonary embolism.  She denies any chest pain, shortness of breath, abdominal pain, nausea, vomiting, urinary symptoms.  HPI  Past Medical History:  Diagnosis Date  . Alzheimer's disease 12/28/2014  . Dementia   . Headache disorder 12/28/2014  . Memory disturbance 12/28/2014  . Pulmonary embolus ([place]) 02/23/2016  . 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 Acute pulmonary embolism ([place]) 03/07/2017  . Acute DVT (deep venous thrombosis) ([place]) 03/07/2017  . Normocytic anemia 03/07/2017  . History of deep vein thrombosis (DVT) of lower extremity 03/06/2017  . History of pulmonary embolism 03/06/2017  . Unintentional weight loss of more than 5% body weight within 1 month 02/23/2017  . Essential hypertension 12/31/2016  . Pelvic mass in female 03/11/2016  . Ovarian mass 02/24/2016  . Vitamin D deficiency 01/02/2015  . Prediabetes 01/02/2015  . Memory disturbance 12/28/2014  . Alzheimer's disease 12/28/2014    History reviewed. No pertinent surgical history.  OB History    Gravida Para Term Preterm AB Living   3       0 3   SAB TAB Ectopic Multiple Live Births       0           Home Medications    Prior to Admission medications   Medication Sig Start Date End  Date Taking? Authorizing Provider  donepezil (ARICEPT) 10 MG tablet Take 1 tablet (10 mg total) by mouth at bedtime. 12/29/16  Yes Ward Givens, NP  furosemide (LASIX) 20 MG tablet Take 1 tablet (20 mg total) by mouth daily as needed for edema. 03/05/17  Yes [name], MD  mirtazapine (REMERON) 30 MG tablet Take 1 tablet (30 mg total) by mouth at bedtime. 02/02/17  Yes [name], MD  senna-docusate (SENOKOT-S) 8.6-50 MG tablet Take 4 tablets by mouth at bedtime. 02/23/17  Yes [name], MD    Family History Family History  Problem Relation Age of Onset  . Heart disease Brother   . Heart attack Mother   . Dementia Mother     Social History Social History   Tobacco Use  . Smoking status: Never Smoker  . Smokeless tobacco: Never Used  Substance Use Topics  . Alcohol use: No  . Drug use: No     Allergies   Sulfa antibiotics   Review of Systems Review of Systems  Constitutional: Negative for chills and fever.  HENT: Negative for facial swelling and sore throat.   Respiratory: Negative for shortness of breath.   Cardiovascular: Negative for chest pain.  Gastrointestinal: Negative for abdominal pain, nausea and vomiting.  Genitourinary: Negative for dysuria.  Musculoskeletal: Positive for joint swelling (L lower  leg). Negative for back pain.  Skin: Negative for rash and wound.  Neurological: Negative for headaches.  Psychiatric/Behavioral: The patient is not nervous/anxious.      Physical Exam Updated Vital Signs BP 119/80 (BP Location: Left Arm)   Pulse 81   Temp 97.8 F (36.6 C) (Oral)   Resp (!) 21   Ht 5\' 6"  (1.676 m) Comment: per pt husband  Wt 68 kg (150 lb)   SpO2 100%   BMI 24.21 kg/m   Physical Exam  Constitutional: She appears well-developed and well-nourished. No distress.  HENT:  Head: Normocephalic and atraumatic.  Mouth/Throat: Oropharynx is clear and moist. No oropharyngeal exudate.  Eyes: Conjunctivae are normal. Pupils are equal, round,  and reactive to light. Right eye exhibits no discharge. Left eye exhibits no discharge. No scleral icterus.  Neck: Normal range of motion. Neck supple. No thyromegaly present.  Cardiovascular: Normal rate, regular rhythm, normal heart sounds and intact distal pulses. Exam reveals no gallop and no friction rub.  No murmur heard. Pulmonary/Chest: Effort normal and breath sounds normal. No stridor. No respiratory distress. She has no wheezes. She has no rales.  Abdominal: Soft. Bowel sounds are normal. She exhibits no distension. There is no tenderness. There is no rebound and no guarding.  Musculoskeletal: She exhibits edema (L>R calf mild edema, no tenderness or erythema ).  Lymphadenopathy:    She has no cervical adenopathy.  Neurological: She is alert. Coordination normal.  Skin: Skin is warm and dry. No rash noted. She is not diaphoretic. No pallor.  Psychiatric: She has a normal mood and affect.  Nursing note and vitals reviewed.    ED Treatments / Results  Labs (all labs ordered are listed, but only abnormal results are displayed) Labs Reviewed  CBC - Abnormal; Notable for the following components:      Result Value   RBC 3.74 (*)    Hemoglobin 10.5 (*)    HCT 32.4 (*)    All other components within normal limits  BASIC METABOLIC PANEL - Abnormal; Notable for the following components:   Potassium 3.0 (*)    Calcium 8.6 (*)    Anion gap 21 (*)    All other components within normal limits  FERRITIN - Abnormal; Notable for the following components:   Ferritin 313 (*)    All other components within normal limits  RETICULOCYTES - Abnormal; Notable for the following components:   RBC. 3.80 (*)    All other components within normal limits  PROTIME-INR  APTT  VITAMIN B12  FOLATE  IRON AND TIBC  TSH  CBC  COMPREHENSIVE METABOLIC PANEL  HEPARIN LEVEL (UNFRACTIONATED)  POC OCCULT BLOOD, ED    EKG  EKG Interpretation None       Radiology Ct Angio Chest Pe W And/or Wo  Contrast  Result Date: 03/07/2017 CLINICAL DATA:  Known DVTs, evaluate for pulmonary embolism. EXAM: CT ANGIOGRAPHY CHEST WITH CONTRAST TECHNIQUE: Multidetector CT imaging of the chest was performed using the standard protocol during bolus administration of intravenous contrast. Multiplanar CT image reconstructions and MIPs were obtained to evaluate the vascular anatomy. CONTRAST:  129mL ISOVUE-370 IOPAMIDOL (ISOVUE-370) INJECTION 76% COMPARISON:  Chest CT - 02/23/2016 FINDINGS: Vascular Findings: There is adequate opacification of the pulmonary arterial system with the main pulmonary artery measuring 426 Hounsfield units. Nonocclusive pulmonary embolism is seen involving the right upper lobe pulmonary artery (representative axial image 101, series 7; coronal image 83, series 8) as well as the distal aspect of the right  interlobar pulmonary artery extending to involve the right middle and lower lobe pulmonary arteries (representative axial image 140, series 7; coronal image 86, series 8). The left pulmonary arterial tree appears widely patent. Overall clot burden is deemed small in volume. There is no CT evidence of right-sided heart strain nor is there reflux of injected contrast into the hepatic venous system. Normal caliber of the main pulmonary artery. Normal heart size.  Trace pericardial effusion. No evidence of thoracic aortic aneurysm or dissection on this nongated examination. Bovine configuration of the aortic arch. The branch vessels of the aortic arch appear patent throughout their imaged course. Review of the MIP images confirms the above findings. ---------------------------------------------------------------------------------- Nonvascular Findings: Mediastinum/Lymph Nodes: Scattered mediastinal lymph nodes are numerous though individually not enlarged by size criteria with index precarinal lymph node measuring 0.9 cm in greatest short axis diameter (image 54, series 5). Lungs/Pleura: Minimal  dependent subpleural ground-glass atelectasis. No discrete focal airspace opacities. No pleural effusion or pneumothorax. The central pulmonary airways appear widely patent. No discrete pulmonary nodules. Upper abdomen: Limited visualization of the lower thorax demonstrates 3 discrete hypointense cysts within the imaged right lobe of the liver with dominant cyst measuring approximately 3.1 cm in diameter. Musculoskeletal: No acute or aggressive osseous abnormalities. Incidental note made of a bone island within the right T10 pedicle. Regional soft tissues appear normal. Normal appearance of the imaged portions of the thyroid gland IMPRESSION: Examination is positive for small volume right-sided pulmonary embolism without CT evidence of right-sided heart strain or pulmonary infarction. Electronically Signed   By: Sandi Mariscal M.D.   On: 03/07/2017 15:17    Procedures Procedures (including critical care time)  Medications Ordered in ED Medications  donepezil (ARICEPT) tablet 10 mg (10 mg Oral Given 03/07/17 2105)  mirtazapine (REMERON) tablet 30 mg (30 mg Oral Given 03/07/17 2106)  senna-docusate (Senokot-S) tablet 4 tablet (4 tablets Oral Given 03/07/17 2106)  acetaminophen (TYLENOL) tablet 650 mg (not administered)    Or  acetaminophen (TYLENOL) suppository 650 mg (not administered)  ondansetron (ZOFRAN) tablet 4 mg (not administered)    Or  ondansetron (ZOFRAN) injection 4 mg (not administered)  heparin ADULT infusion 100 units/mL (25000 units/276mL sodium chloride 0.45%) (1,200 Units/hr Intravenous New Bag/Given 03/07/17 1926)  feeding supplement (ENSURE ENLIVE) (ENSURE ENLIVE) liquid 237 mL (not administered)  iopamidol (ISOVUE-370) 76 % injection (100 mLs  Contrast Given 03/07/17 1449)  potassium chloride SA (K-DUR,KLOR-CON) CR tablet 60 mEq (60 mEq Oral Given 03/07/17 1525)  heparin bolus via infusion 4,500 Units (4,500 Units Intravenous Bolus from Bag 03/07/17 1926)     Initial Impression /  Assessment and Plan / ED Course  I have reviewed the triage vital signs and the nursing notes.  Pertinent labs & imaging results that were available during my care of the patient were reviewed by me and considered in my medical decision making (see chart for details).     Patient with new L DVT seen on outpatient ultrasound as well as small volume PE without right heart strain.  Patient denies any chest pain or shortness of breath. BMP shows hyperkalemia, potassium 3.0, and anion gap 21.  Potassium replaced in the ED.  Hemoglobin 10.5, which is new in the past several weeks.  Fecal occult negative.  I consulted Dr. Broadus John with [facility] who will admit the patient for further evaluation and treatment.  Patient also evaluated by Dr. Sabra Heck who guided the patient's management and agrees with plan.  Final Clinical Impressions(s) / ED  Diagnoses   Final diagnoses:  Other acute pulmonary embolism without acute cor pulmonale (HCC)  Anemia, unspecified type    ED Discharge Orders    None       Frederica Kuster, PA-C 03/07/17 2223    Noemi Chapel, MD 03/08/17 [phone]

## 2017-03-07 NOTE — ED Provider Notes (Signed)
The patient is a 72 year old female, prior history of DVT with recurrent leg swelling on the left.  DVT study was ordered by primary earlier in the day was positive for DVT distal to the knee, was sent to hospital for CT angiogram to rule out pulmonary embolism given history.  She is not currently anticoagulated.  On my exam the patient does in fact have asymmetrical swelling left lower extremity greater than right lower extremity but normal pulses, no tachycardia, clear heart and lung sounds.   CT angiogram will be ordered  To r/o PE.  Pt is at HIGH risk of same  I have personally seen the CT and it shows a PE - mutiple R sided clots - no large clots in the L side (none seen by me).  Will need admission (of note her Hgb is slightly lower than the past at < 11 and was 13 a month ago.)  Heparin drip started - CC provided.  .Critical Care Performed by: Noemi Chapel, MD Authorized by: Noemi Chapel, MD   Critical care provider statement:    Critical care time (minutes):  35   Critical care time was exclusive of:  Separately billable procedures and treating other patients and teaching time   Critical care was necessary to treat or prevent imminent or life-threatening deterioration of the following conditions: Large PE.   Critical care was time spent personally by me on the following activities:  Development of treatment plan with patient or surrogate, discussions with consultants, evaluation of patient's response to treatment, examination of patient, obtaining history from patient or surrogate, ordering and performing treatments and interventions, ordering and review of laboratory studies, ordering and review of radiographic studies, pulse oximetry, re-evaluation of patient's condition and review of old charts     Medical screening examination/treatment/procedure(s) were conducted as a shared visit with non-physician practitioner(s) and myself.  I personally evaluated the patient during the  encounter.  Clinical Impression:   Final diagnoses:  Other acute pulmonary embolism without acute cor pulmonale (HCC)  Anemia, unspecified type         Noemi Chapel, MD 03/08/17 [phone]

## 2017-03-07 NOTE — Progress Notes (Signed)
ANTICOAGULATION CONSULT NOTE - Initial Consult  Pharmacy Consult for Heparin  Indication: pulmonary embolus  Allergies  Allergen Reactions  . Sulfa Antibiotics Palpitations    Patient Measurements:   Heparin Dosing Weight: 68 kg  Vital Signs: Temp: 98.5 F (36.9 C) (02/23 1133) Temp Source: Oral (02/23 1133) BP: 141/92 (02/23 1500) Pulse Rate: 111 (02/23 1500)  Labs: Recent Labs    03/05/17 1658 03/07/17 1146  HGB  --  10.5*  HCT  --  32.4*  PLT  --  243  APTT  --  28  LABPROT  --  13.4  INR  --  1.03  CREATININE 0.80 0.82    Estimated Creatinine Clearance: 62.1 mL/min (by C-G formula based on SCr of 0.82 mg/dL).   Medical History: Past Medical History:  Diagnosis Date  . Alzheimer's disease 12/28/2014  . Dementia   . Headache disorder 12/28/2014  . Memory disturbance 12/28/2014  . Pulmonary embolus ([place]) 02/23/2016  . Right femoral vein DVT ([facility]) 02/24/2016    Medications:   (Not in a hospital admission)  Assessment: 51 YOF with prior history of DVT here with recurrent leg swelling. She was found to have a new PE with multiple R sided clots. Pharmacy consulted to start IV heparin. She was on Xarelto in the past which was d/c'ed after old clot resolution. H/H low, Plt wnl.   Goal of Therapy:  Heparin level 0.3-0.7 units/ml Monitor platelets by anticoagulation protocol: Yes   Plan:  -Heparin 4500 units IV bolus, then start IV heparin drip at 1200 units/hr -F/u 8 hr HL -Monitor daily HL, CBC and s/s of bleeding   Albertina Parr, PharmD., BCPS Clinical Pharmacist Pager [phone]

## 2017-03-07 NOTE — ED Notes (Signed)
Patient transported to CT 

## 2017-03-07 NOTE — Progress Notes (Signed)
VASCULAR LAB PRELIMINARY  PRELIMINARY  PRELIMINARY  PRELIMINARY  Bilateral lower extremity venous duplex completed.    Preliminary report:  There is acute DVT noted in the left posterior tibial and peroneal veins.    Called results to Dr. Brigitte Pulse who directed the patient to the ED for CTA and treatment  Shamarie Call, RVT 03/07/2017, 11:11 AM

## 2017-03-07 NOTE — ED Notes (Signed)
Unsuccessful IV start attempts x2

## 2017-03-07 NOTE — ED Triage Notes (Signed)
Pt. Returned from vascular with positive DVT in left lower leg.

## 2017-03-07 NOTE — Progress Notes (Signed)
Pt came from ed to [place] and husband oriented to unit. Clarified with Dr. Durenda Hurt to restart heparin drip. Will cont to monitor pt.

## 2017-03-07 NOTE — ED Notes (Signed)
Attempted to call report x 1  

## 2017-03-07 NOTE — Telephone Encounter (Signed)
Per Dr. Brigitte Pulse,  Schedule bilateral leg venous doppler stat.- sched w/[facility] Called husband and advised.   Scheduled at [facility] - go to ED - Go to admitting desk Advise staff you are for out patient doppler Be at hospital by 10:45am today. Husband verbalized understanding.  Call report will come to PCP TL desk [phone]

## 2017-03-07 NOTE — H&P (Signed)
History and Physical    EDDY TERMINE QIH:[id number] DOB: 04/11/1945 DOA: 03/07/2017  Referring MD/NP/PA: EDP PCP:  Patient coming from: Home  Chief Complaint: Leg swelling/DVT  HPI: Tammy [name] is a 72 y.o. female with medical history significant of Alzheimer's dementia, history of pulmonary embolism in 02/2016, which was felt to be provoked following a lot work trip, treated with 6-7 months of anticoagulation using Xarelto, this was discontinued in September after hematology follow-up. -History is primarily provided by her husband who reports increased leg swelling for the past week, she used to be on HCTZ which was discontinued by her primary M.D., and transitioned to Lasix and also long ago however continued to have increased left leg swelling. She was referred to the vascular lab for venous Dopplers which were found to be positive for DVTs In the left posterior tibial and peroneal veins, subsequently referred by PCP to the ED for [facility]A to [facility] and treatment. CT angiogram noted mild nonspecific mediastinal lymphadenopathy and small pulmonary emboli.  ED Course: She was started on IV heparin per pharmacy  Review of Systems: As per HPI otherwise 14 point review of systems negative, Except for 20-30 pound weight loss over 6 months which her husband attributes to very poor/minimal Po intake   Past Medical History:  Diagnosis Date  . Alzheimer's disease 12/28/2014  . Dementia   . Headache disorder 12/28/2014  . Memory disturbance 12/28/2014  . Pulmonary embolus ([place]) 02/23/2016  . 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 No pertinent surgical history.   reports that  has never smoked. she has never used smokeless tobacco. She reports that she does not drink alcohol or use drugs.  Allergies  Allergen Reactions  . Sulfa Antibiotics Palpitations    Family History  Problem Relation Age of Onset  . Heart disease Brother   . Heart attack Mother   . Dementia Mother       Prior to Admission medications   Medication Sig Start Date End Date Taking? Authorizing Provider  donepezil (ARICEPT) 10 MG tablet Take 1 tablet (10 mg total) by mouth at bedtime. 12/29/16  Yes Ward Givens, NP  furosemide (LASIX) 20 MG tablet Take 1 tablet (20 mg total) by mouth daily as needed for edema. 03/05/17  Yes [name], MD  mirtazapine (REMERON) 30 MG tablet Take 1 tablet (30 mg total) by mouth at bedtime. 02/02/17  Yes [name], MD  senna-docusate (SENOKOT-S) 8.6-50 MG tablet Take 4 tablets by mouth at bedtime. 02/23/17  Yes [name], MD    Physical Exam: Vitals:   03/07/17 1133 03/07/17 1245 03/07/17 1345 03/07/17 1500  BP: (!) 142/85 (!) 145/85 137/79 (!) 141/92  Pulse: 75 62  (!) 111  Resp: (!) 24 18 (!) 22 18  Temp: 98.5 F (36.9 C)     TempSrc: Oral     SpO2: 100% 100%  100%      Constitutional: NAD, calm, comfortable, no distress, pleasant  Vitals:   03/07/17 1133 03/07/17 1245 03/07/17 1345 03/07/17 1500  BP: (!) 142/85 (!) 145/85 137/79 (!) 141/92  Pulse: 75 62  (!) 111  Resp: (!) 24 18 (!) 22 18  Temp: 98.5 F (36.9 C)     TempSrc: Oral     SpO2: 100% 100%  100%   Eyes: PERRL, lids and conjunctivae normal ENMT: Mucous membranes are moist.  Neck: normal, supple Respiratory: Good air movement, lungs are clear  Cardiovascular: Regular rate and  rhythm, Abdomen: soft, non tender, Bowel sounds positive.  Musculoskeletal: No joint deformity upper and lower extremities. Ext: 1+ edema left greater than right  Skin: no rashes, lesions, ulcers.  Neurologic: CN 2-12 grossly intact. Sensation intact, DTR normal. Strength 5/5 in all 4.  Psychiatric: Normal judgment and insight. Alert and oriented x 2. Normal mood.   Labs on Admission: I have personally reviewed following labs and imaging studies  CBC: Recent Labs  Lab 03/07/17 1146  WBC 5.0  HGB 10.5*  HCT 32.4*  MCV 86.6  PLT 706   Basic Metabolic Panel: Recent Labs  Lab  03/05/17 1658 03/07/17 1146  NA 148* 144  K 3.4* 3.0*  CL 108* 101  CO2 25 22  GLUCOSE 89 94  BUN 14 10  CREATININE 0.80 0.82  CALCIUM 9.6 8.6*   GFR: Estimated Creatinine Clearance: 62.1 mL/min (by C-G formula based on SCr of 0.82 mg/dL). Liver Function Tests: Recent Labs  Lab 03/05/17 1658  AST 26  ALT 49*  ALKPHOS 109  BILITOT 0.3  PROT 7.1  ALBUMIN 3.9   No results for input(s): LIPASE, AMYLASE in the last 168 hours. No results for input(s): AMMONIA in the last 168 hours. Coagulation Profile: Recent Labs  Lab 03/07/17 1146  INR 1.03   Cardiac Enzymes: No results for input(s): CKTOTAL, CKMB, CKMBINDEX, TROPONINI in the last 168 hours. BNP (last 3 results) No results for input(s): PROBNP in the last 8760 hours. HbA1C: No results for input(s): HGBA1C in the last 72 hours. CBG: No results for input(s): GLUCAP in the last 168 hours. Lipid Profile: No results for input(s): CHOL, HDL, LDLCALC, TRIG, CHOLHDL, LDLDIRECT in the last 72 hours. Thyroid Function Tests: No results for input(s): TSH, T4TOTAL, FREET4, T3FREE, THYROIDAB in the last 72 hours. Anemia Panel: No results for input(s): VITAMINB12, FOLATE, FERRITIN, TIBC, IRON, RETICCTPCT in the last 72 hours. Urine analysis:    Component Value Date/Time   COLORURINE AMBER (A) 11/09/2016 1600   APPEARANCEUR CLEAR 11/09/2016 1600   LABSPEC 1.024 11/09/2016 1600   PHURINE 5.0 11/09/2016 1600   GLUCOSEU NEGATIVE 11/09/2016 1600   HGBUR MODERATE (A) 11/09/2016 1600   BILIRUBINUR small (A) 02/17/2017 1220   KETONESUR small (15) (A) 02/17/2017 1220   KETONESUR 80 (A) 11/09/2016 1600   PROTEINUR =30 (A) 02/17/2017 1220   PROTEINUR 30 (A) 11/09/2016 1600   UROBILINOGEN 0.2 02/17/2017 1220   NITRITE Negative 02/17/2017 1220   NITRITE NEGATIVE 11/09/2016 1600   LEUKOCYTESUR Small (1+) (A) 02/17/2017 1220   Sepsis Labs: @LABRCNTIP (procalcitonin:4,lacticidven:4) )No results found for this or any previous visit  (from the past 240 hour(s)).   Radiological Exams on Admission: Ct Angio Chest Pe W And/or Wo Contrast  Result Date: 03/07/2017 CLINICAL DATA:  Known DVTs, evaluate for pulmonary embolism. EXAM: CT ANGIOGRAPHY CHEST WITH CONTRAST TECHNIQUE: Multidetector CT imaging of the chest was performed using the standard protocol during bolus administration of intravenous contrast. Multiplanar CT image reconstructions and MIPs were obtained to evaluate the vascular anatomy. CONTRAST:  155mL ISOVUE-370 IOPAMIDOL (ISOVUE-370) INJECTION 76% COMPARISON:  Chest CT - 02/23/2016 FINDINGS: Vascular Findings: There is adequate opacification of the pulmonary arterial system with the main pulmonary artery measuring 426 Hounsfield units. Nonocclusive pulmonary embolism is seen involving the right upper lobe pulmonary artery (representative axial image 101, series 7; coronal image 83, series 8) as well as the distal aspect of the right interlobar pulmonary artery extending to involve the right middle and lower lobe pulmonary arteries (representative axial image  140, series 7; coronal image 86, series 8). The left pulmonary arterial tree appears widely patent. Overall clot burden is deemed small in volume. There is no CT evidence of right-sided heart strain nor is there reflux of injected contrast into the hepatic venous system. Normal caliber of the main pulmonary artery. Normal heart size.  Trace pericardial effusion. No evidence of thoracic aortic aneurysm or dissection on this nongated examination. Bovine configuration of the aortic arch. The branch vessels of the aortic arch appear patent throughout their imaged course. Review of the MIP images confirms the above findings. ---------------------------------------------------------------------------------- Nonvascular Findings: Mediastinum/Lymph Nodes: Scattered mediastinal lymph nodes are numerous though individually not enlarged by size criteria with index precarinal lymph node  measuring 0.9 cm in greatest short axis diameter (image 54, series 5). Lungs/Pleura: Minimal dependent subpleural ground-glass atelectasis. No discrete focal airspace opacities. No pleural effusion or pneumothorax. The central pulmonary airways appear widely patent. No discrete pulmonary nodules. Upper abdomen: Limited visualization of the lower thorax demonstrates 3 discrete hypointense cysts within the imaged right lobe of the liver with dominant cyst measuring approximately 3.1 cm in diameter. Musculoskeletal: No acute or aggressive osseous abnormalities. Incidental note made of a bone island within the right T10 pedicle. Regional soft tissues appear normal. Normal appearance of the imaged portions of the thyroid gland IMPRESSION: Examination is positive for small volume right-sided pulmonary embolism without CT evidence of right-sided heart strain or pulmonary infarction. Electronically Signed   By: Sandi Mariscal M.D.   On: 03/07/2017 15:17    EKG: I have Independently reviewed EKG, no acute ST-T wave changes noted  Assessment/Plan Principal Problem:   Acute pulmonary embolism (HCC)/DVT -Trigger for pulmonary embolism is unclear at this time, except for advanced age and relative immobility -Last year she had an acute PE which was felt to be provoked by long-distance travel -She was started on IV heparin in the ED will continue this overnight and transition her over to oral anticoagulation tomorrow -Monitor on telemetry -Needs age appropriate malignancy screening, unfortunately she has never had a colonoscopy mammogram etc. we will defer this to her PCP -Consider CT abdomen pelvis, as outpatient  Weight loss -Approximately 20-30 pounds in the last 6 months, this is pretty remarkable however spouse reports that patient had pretty much stopped eating and has very poor appetite with her Alzheimer's dementia progressing -Also needs malignancy screening -Dietitian consult  Alzheimer's  dementia -Continue Aricept  Mild normocytic anemia -Hemoccult-negative -Check anemia panel  Hypokalemia  -Secondary to diuretic use  -Replaced   DVT prophylaxis: Heparin Code Status: Full Code  Family Communication: spouse at bedside  Disposition Plan: home  Consults called: none Admission status: Inpatient   Domenic Polite MD [facility] Pager [phone]  If 7PM-7AM, please contact night-coverage [email or web address] Password [facility]  03/07/2017, 4:45 PM

## 2017-03-08 DIAGNOSIS — D649 Anemia, unspecified: Secondary | ICD-10-CM | POA: Diagnosis not present

## 2017-03-08 DIAGNOSIS — I2699 Other pulmonary embolism without acute cor pulmonale: Secondary | ICD-10-CM

## 2017-03-08 DIAGNOSIS — G309 Alzheimer's disease, unspecified: Secondary | ICD-10-CM

## 2017-03-08 DIAGNOSIS — I824Z2 Acute embolism and thrombosis of unspecified deep veins of left distal lower extremity: Secondary | ICD-10-CM

## 2017-03-08 DIAGNOSIS — F0280 Dementia in other diseases classified elsewhere without behavioral disturbance: Secondary | ICD-10-CM | POA: Diagnosis not present

## 2017-03-08 LAB — CBC
HEMATOCRIT: 28.7 % — AB (ref 36.0–46.0)
Hemoglobin: 9.2 g/dL — ABNORMAL LOW (ref 12.0–15.0)
MCH: 27.5 pg (ref 26.0–34.0)
MCHC: 32.1 g/dL (ref 30.0–36.0)
MCV: 85.9 fL (ref 78.0–100.0)
Platelets: 219 10*3/uL (ref 150–400)
RBC: 3.34 MIL/uL — AB (ref 3.87–5.11)
RDW: 15.4 % (ref 11.5–15.5)
WBC: 6.1 10*3/uL (ref 4.0–10.5)

## 2017-03-08 LAB — MAGNESIUM: Magnesium: 1.8 mg/dL (ref 1.7–2.4)

## 2017-03-08 LAB — HEPARIN LEVEL (UNFRACTIONATED): HEPARIN UNFRACTIONATED: 0.96 [IU]/mL — AB (ref 0.30–0.70)

## 2017-03-08 LAB — COMPREHENSIVE METABOLIC PANEL
ALT: 28 U/L (ref 14–54)
AST: 22 U/L (ref 15–41)
Albumin: 3 g/dL — ABNORMAL LOW (ref 3.5–5.0)
Alkaline Phosphatase: 75 U/L (ref 38–126)
Anion gap: 9 (ref 5–15)
BILIRUBIN TOTAL: 0.9 mg/dL (ref 0.3–1.2)
BUN: 8 mg/dL (ref 6–20)
CO2: 25 mmol/L (ref 22–32)
CREATININE: 0.9 mg/dL (ref 0.44–1.00)
Calcium: 8.8 mg/dL — ABNORMAL LOW (ref 8.9–10.3)
Chloride: 109 mmol/L (ref 101–111)
GFR calc Af Amer: >60 mL/min (ref >60)
Glucose, Bld: 83 mg/dL (ref 65–99)
Potassium: 3.4 mmol/L — ABNORMAL LOW (ref 3.5–5.1)
Sodium: 143 mmol/L (ref 135–145)
TOTAL PROTEIN: 5.7 g/dL — AB (ref 6.5–8.1)

## 2017-03-08 MED ORDER — POTASSIUM CHLORIDE CRYS ER 20 MEQ PO TBCR
40.0000 meq | EXTENDED_RELEASE_TABLET | Freq: Once | ORAL | Status: AC
  Administered 2017-03-08: 40 meq via ORAL
  Filled 2017-03-08: qty 2

## 2017-03-08 MED ORDER — RIVAROXABAN (XARELTO) VTE STARTER PACK (15 & 20 MG): ORAL_TABLET | ORAL | 0 refills | Status: DC

## 2017-03-08 MED ORDER — RIVAROXABAN 15 MG PO TABS
15.0000 mg | ORAL_TABLET | Freq: Two times a day (BID) | ORAL | Status: DC
  Administered 2017-03-08: 15 mg via ORAL

## 2017-03-08 MED ORDER — RIVAROXABAN 15 MG PO TABS
15.0000 mg | ORAL_TABLET | Freq: Two times a day (BID) | ORAL | Status: DC
  Filled 2017-03-08: qty 1

## 2017-03-08 MED ORDER — ENSURE ENLIVE PO LIQD: 237.0000 mL | Freq: Two times a day (BID) | ORAL | 12 refills | Status: DC

## 2017-03-08 NOTE — Progress Notes (Signed)
ANTICOAGULATION CONSULT NOTE - Follow-Up Consult  Pharmacy Consult for: Heparin to Xarelto Indication: pulmonary embolus  Allergies  Allergen Reactions  . Sulfa Antibiotics Palpitations   Patient Measurements: Height: 5\' 6"  (167.6 cm)(per pt husband) Weight: 148 lb 6.4 oz (67.3 kg) IBW/kg (Calculated) : 59.3 Heparin Dosing Weight: 68 kg  Vital Signs: Temp: 98.2 F (36.8 C) (02/24 0339) Temp Source: Oral (02/24 0339) BP: 120/83 (02/24 0339) Pulse Rate: 71 (02/24 0339)  Labs: Recent Labs    03/05/17 1658 03/07/17 1146 03/08/17 0313  HGB  --  10.5* 9.2*  HCT  --  32.4* 28.7*  PLT  --  243 219  APTT  --  28  --   LABPROT  --  13.4  --   INR  --  1.03  --   HEPARINUNFRC  --   --  0.96*  CREATININE 0.80 0.82 0.90    Estimated Creatinine Clearance: 54.4 mL/min (by C-G formula based on SCr of 0.9 mg/dL).   Medical History: Past Medical History:  Diagnosis Date  . Alzheimer's disease 12/28/2014  . Dementia   . Headache disorder 12/28/2014  . Memory disturbance 12/28/2014  . Pulmonary embolus ([place]) 02/23/2016  . Right femoral vein DVT ([place]) 02/24/2016   Assessment: 56 YOF with prior history of DVT here with recurrent leg swelling. She was found to have a new PE with multiple R sided clots. Pharmacy consulted to transition from heparin to Xarelto. She was on Xarelto in the past which was d/c'ed after old clot resolution.   Small drop in H/H noted. No bleeding noted.   Goal of Therapy:  Monitor platelets by anticoagulation protocol: Yes   Plan:  Discontinue heparin  Initiate Xarelto 15 mg twice daily for 21 days followed by Xarelto 20 mg daily with food Monitor CBC and s/sx of bleeding   Diana L. Kyung Rudd, PharmD, [place] PGY1 Pharmacy Resident Pager: [phone]

## 2017-03-08 NOTE — Discharge Instructions (Signed)
Please get your medications reviewed and adjusted by your Primary MD. ° °Please request your Primary MD to go over all Hospital Tests and Procedure/Radiological results at the follow up, please get all Hospital records sent to your Prim MD by signing hospital release before you go home. ° °If you had Pneumonia of Lung problems at the Hospital: °Please get a 2 view Chest X ray done in 6-8 weeks after hospital discharge or sooner if instructed by your Primary MD. ° °If you have Congestive Heart Failure: °Please call your Cardiologist or Primary MD anytime you have any of the following symptoms:  °1) 3 pound weight gain in 24 hours or 5 pounds in 1 week  °2) shortness of breath, with or without a dry hacking cough  °3) swelling in the hands, feet or stomach  °4) if you have to sleep on extra pillows at night in order to breathe ° °Follow cardiac low salt diet and 1.5 lit/day fluid restriction. ° °If you have diabetes °Accuchecks 4 times/day, Once in AM empty stomach and then before each meal. °Log in all results and show them to your primary doctor at your next visit. °If any glucose reading is under 80 or above 300 call your primary MD immediately. ° °If you have Seizure/Convulsions/Epilepsy: °Please do not drive, operate heavy machinery, participate in activities at heights or participate in high speed sports until you have seen by Primary MD or a Neurologist and advised to do so again. ° °If you had Gastrointestinal Bleeding: °Please ask your Primary MD to check a complete blood count within one week of discharge or at your next visit. Your endoscopic/colonoscopic biopsies that are pending at the time of discharge, will also need to followed by your Primary MD. ° °Get Medicines reviewed and adjusted. °Please take all your medications with you for your next visit with your Primary MD ° °Please request your Primary MD to go over all hospital tests and procedure/radiological results at the follow up, please ask your  Primary MD to get all Hospital records sent to his/her office. ° °If you experience worsening of your admission symptoms, develop shortness of breath, life threatening emergency, suicidal or homicidal thoughts you must seek medical attention immediately by calling 911 or calling your MD immediately  if symptoms less severe. ° °You must read complete instructions/literature along with all the possible adverse reactions/side effects for all the Medicines you take and that have been prescribed to you. Take any new Medicines after you have completely understood and accpet all the possible adverse reactions/side effects.  ° °Do not drive or operate heavy machinery when taking Pain medications.  ° °Do not take more than prescribed Pain, Sleep and Anxiety Medications ° °Special Instructions: If you have smoked or chewed Tobacco  in the last 2 yrs please stop smoking, stop any regular Alcohol  and or any Recreational drug use. ° °Wear Seat belts while driving. ° °Please note °You were cared for by a hospitalist during your hospital stay. If you have any questions about your discharge medications or the care you received while you were in the hospital after you are discharged, you can call the unit and asked to speak with the hospitalist on call if the hospitalist that took care of you is not available. Once you are discharged, your primary care physician will handle any further medical issues. Please note that NO REFILLS for any discharge medications will be authorized once you are discharged, as it is imperative that you   return to your primary care physician (or establish a relationship with a primary care physician if you do not have one) for your aftercare needs so that they can reassess your need for medications and monitor your lab values.  You can reach the hospitalist office at phone [phone] or fax [phone]   If you do not have a primary care physician, you can call [phone] for a physician  referral.  Rivaroxaban oral tablets What is this medicine? RIVAROXABAN (ri va ROX a ban) is an anticoagulant (blood thinner). It is used to treat blood clots in the lungs or in the veins. It is also used after knee or hip surgeries to prevent blood clots. It is also used to lower the chance of stroke in people with a medical condition called atrial fibrillation. This medicine may be used for other purposes; ask your health care provider or pharmacist if you have questions. COMMON BRAND NAME(S): Xarelto, Xarelto Starter Pack What should I tell my health care provider before I take this medicine? They need to know if you have any of these conditions: -bleeding disorders -bleeding in the brain -blood in your stools (black or tarry stools) or if you have blood in your vomit -history of stomach bleeding -kidney disease -liver disease -low blood counts, like low white cell, platelet, or red cell counts -recent or planned spinal or epidural procedure -take medicines that treat or prevent blood clots -an unusual or allergic reaction to rivaroxaban, other medicines, foods, dyes, or preservatives -pregnant or trying to get pregnant -breast-feeding How should I use this medicine? Take this medicine by mouth with a glass of water. Follow the directions on the prescription label. Take your medicine at regular intervals. Do not take it more often than directed. Do not stop taking except on your doctor's advice. Stopping this medicine may increase your risk of a blood clot. Be sure to refill your prescription before you run out of medicine. If you are taking this medicine after hip or knee replacement surgery, take it with or without food. If you are taking this medicine for atrial fibrillation, take it with your evening meal. If you are taking this medicine to treat blood clots, take it with food at the same time each day. If you are unable to swallow your tablet, you may crush the tablet and mix it in  applesauce. Then, immediately eat the applesauce. You should eat more food right after you eat the applesauce containing the crushed tablet. Talk to your pediatrician regarding the use of this medicine in children. Special care may be needed. Overdosage: If you think you have taken too much of this medicine contact a poison control center or emergency room at once. NOTE: This medicine is only for you. Do not share this medicine with others. What if I miss a dose? If you take your medicine once a day and miss a dose, take the missed dose as soon as you remember. If you take your medicine twice a day and miss a dose, take the missed dose immediately. In this instance, 2 tablets may be taken at the same time. The next day you should take 1 tablet twice a day as directed. What may interact with this medicine? Do not take this medicine with any of the following medications: -defibrotide This medicine may also interact with the following medications: -aspirin and aspirin-like medicines -certain antibiotics like erythromycin, azithromycin, and clarithromycin -certain medicines for fungal infections like ketoconazole and itraconazole -certain medicines for irregular heart beat  like amiodarone, quinidine, dronedarone -certain medicines for seizures like carbamazepine, phenytoin -certain medicines that treat or prevent blood clots like warfarin, enoxaparin, and dalteparin -conivaptan -diltiazem -felodipine -indinavir -lopinavir; ritonavir -NSAIDS, medicines for pain and inflammation, like ibuprofen or naproxen -ranolazine -rifampin -ritonavir -SNRIs, medicines for depression, like desvenlafaxine, duloxetine, levomilnacipran, venlafaxine -SSRIs, medicines for depression, like citalopram, escitalopram, fluoxetine, fluvoxamine, paroxetine, sertraline -St. John's wort -verapamil This list may not describe all possible interactions. Give your health care provider a list of all the medicines, herbs,  non-prescription drugs, or dietary supplements you use. Also tell them if you smoke, drink alcohol, or use illegal drugs. Some items may interact with your medicine. What should I watch for while using this medicine? Visit your doctor or health care professional for regular checks on your progress. Notify your doctor or health care professional and seek emergency treatment if you develop breathing problems; changes in vision; chest pain; severe, sudden headache; pain, swelling, warmth in the leg; trouble speaking; sudden numbness or weakness of the face, arm or leg. These can be signs that your condition has gotten worse. If you are going to have surgery or other procedure, tell your doctor that you are taking this medicine. What side effects may I notice from receiving this medicine? Side effects that you should report to your doctor or health care professional as soon as possible: -allergic reactions like skin rash, itching or hives, swelling of the face, lips, or tongue -back pain -redness, blistering, peeling or loosening of the skin, including inside the mouth -signs and symptoms of bleeding such as bloody or black, tarry stools; red or dark-brown urine; spitting up blood or brown material that looks like coffee grounds; red spots on the skin; unusual bruising or bleeding from the eye, gums, or nose Side effects that usually do not require medical attention (report to your doctor or health care professional if they continue or are bothersome): -dizziness -muscle pain This list may not describe all possible side effects. Call your doctor for medical advice about side effects. You may report side effects to FDA at 1-800-FDA-1088. Where should I keep my medicine? Keep out of the reach of children. Store at room temperature between 15 and 30 degrees C (59 and 86 degrees F). Throw away any unused medicine after the expiration date. NOTE: This sheet is a summary. It may not cover all possible  information. If you have questions about this medicine, talk to your doctor, pharmacist, or health care provider.  2018 Elsevier/Gold Standard (2015-09-19 16:29:33)  Deep Vein Thrombosis Deep vein thrombosis (DVT) is a condition in which a blood clot forms in a deep vein, such as a lower leg, thigh, or arm vein. A clot is blood that has thickened into a gel or solid. This condition is dangerous. It can lead to serious and even life-threatening complications if the clot travels to the lungs and causes a blockage (pulmonary embolism). It can also damage veins in the leg. This can result in leg pain, swelling, discoloration, and sores (post-thrombotic syndrome). What are the causes? This condition may be caused by:  A slowdown of blood flow.  Damage to a vein.  A condition that makes blood clot more easily.  What increases the risk? The following factors may make you more likely to develop this condition:  Being overweight.  Being elderly, especially over age 71.  Sitting or lying down for more than four hours.  Lack of physical activity (sedentary lifestyle).  Being pregnant, giving birth, or having  recently given birth.  Taking medicines that contain estrogen.  Smoking.  A history of any of the following: ? Blood clots or blood clotting disease. ? Peripheral vascular disease. ? Inflammatory bowel disease. ? Cancer. ? Heart disease. ? Genetic conditions that affect how blood clots. ? Neurological diseases that affect the legs (leg paresis). ? Injury. ? Major or lengthy surgery. ? A central line placed inside a large vein.  What are the signs or symptoms? Symptoms of this condition include:  Swelling, pain, or tenderness in an arm or leg.  Warmth, redness, or discoloration in an arm or leg.  If the clot is in your leg, symptoms may be more noticeable or worse when you stand or walk. Some people do not have any symptoms. How is this diagnosed? This condition is  diagnosed with:  A medical history.  A physical exam.  Tests, such as: ? Blood tests. These are done to see how your blood clots. ? Imaging tests. These are done to check for clots. Tests may include:  Ultrasound.  CT scan.  MRI.  X-ray.  Venogram. For this test, X-rays are taken after a dye is injected into a vein.  How is this treated? Treatment for this condition depends on the cause, your risk for bleeding or developing more clots, and any medical conditions you have. Treatment may include:  Taking blood thinners (also called anticoagulants). These medicines may be taken by mouth, injected under the skin, or injected through an IV tube (catheter). These medicines prevent clots from forming.  Injecting medicine that dissolves blood clots into the affected vein (catheter-directed thrombolysis).  Having surgery. Surgery may be done to: ? Remove the clot. ? Place a filter in a large vein to catch blood clots before they reach the lungs.  Some treatments may be continued for up to six months. Follow these instructions at home: If you are taking an oral blood thinner:  Take the medicine exactly as told by your health care provider. Some blood thinners need to be taken at the same time every day. Do not skip a dose.  Ask your health care provider about what foods and drugs interact with the medicine.  Ask about possible side effects. General instructions  Blood thinners can cause easy bruising and difficulty stopping bleeding. Because of this, if you are taking or were given a blood thinner: ? Hold pressure over cuts for longer than usual. ? Tell your dentist and other health care providers that you are taking blood thinners before having any procedures that can cause bleeding. ? Avoid contact sports.  Take over-the-counter and prescription medicines only as told by your health care provider.  Return to your normal activities as told by your health care provider. Ask  your health care provider what activities are safe for you.  Wear compression stockings if recommended by your health care provider.  Keep all follow-up visits as told by your health care provider. This is important. How is this prevented? To lower your risk of developing this condition again:  For 30 or more minutes every day, do an activity that: ? Involves moving your arms and legs. ? Increases your heart rate.  When traveling for longer than four hours: ? Exercise your arms and legs every hour. ? Drink plenty of water. ? Avoid drinking alcohol.  Avoid sitting or lying for a long time without moving your legs.  Stay a healthy weight.  If you are a woman who is older than age 54,  avoid unnecessary use of medicines that contain estrogen.  Do not use any products that contain nicotine or tobacco, such as cigarettes and e-cigarettes. This is especially important if you take estrogen medicines. If you need help quitting, ask your health care provider.  Contact a health care provider if:  You miss a dose of your blood thinner.  You have nausea, vomiting, or diarrhea that lasts for more than one day.  Your menstrual period is heavier than usual.  You have unusual bruising. Get help right away if:  You have new or increased pain, swelling, or redness in an arm or leg.  You have numbness or tingling in an arm or leg.  You have shortness of breath.  You have chest pain.  You have a rapid or irregular heartbeat.  You feel light-headed or dizzy.  You cough up blood.  There is blood in your vomit, stool, or urine.  You have a serious fall or accident, or you hit your head.  You have a severe headache or confusion.  You have a cut that will not stop bleeding. These symptoms may represent a serious problem that is an emergency. Do not wait to see if the symptoms will go away. Get medical help right away. Call your local emergency services (911 in the [place] Do not drive  yourself to the hospital. Summary  DVT is a condition in which a blood clot forms in a deep vein, such as a lower leg, thigh, or arm vein.  Symptoms can include swelling, warmth, pain, and redness in your leg or arm.  Treatment may include taking blood thinners, injecting medicine that dissolves blood clots,wearing compression stockings, or surgery.  If you are prescribed blood thinners, take them exactly as told. This information is not intended to replace advice given to you by your health care provider. Make sure you discuss any questions you have with your health care provider. Document Released: 12/30/2004 Document Revised: 02/02/2016 Document Reviewed: 02/02/2016 Elsevier Interactive Patient Education  2018 [employer].  Pulmonary Embolism A pulmonary embolism (PE) is a sudden blockage or decrease of blood flow in one lung or both lungs. Most blockages come from a blood clot that forms in a lower leg, thigh, or arm vein (deep vein thrombosis, DVT) and travels to the lungs. A clot is blood that has thickened into a gel or solid. PE is a dangerous and life-threatening condition that needs to be treated right away. What are the causes? This condition is usually caused by a blood clot that forms in a vein and moves to the lungs. In rare cases, it may be caused by air, fat, part of a tumor, or other tissue that moves through the veins and into the lungs. What increases the risk? The following factors may make you more likely to develop this condition:  Having DVT or a history of DVT.  Being older than age 41.  Personal or family history of blood clots or blood clotting disease.  Major or lengthy surgery.  Orthopedic surgery, especially hip or knee replacement.  Traumatic injury, such as breaking a hip or leg.  Spinal cord injury.  Stroke.  Taking medicines that contain estrogen. These include birth control pills and hormone replacement therapy.  Long-term (chronic) lung or  heart disease.  Cancer and chemotherapy.  Having a central venous catheter.  Pregnancy and the period after delivery.  What are the signs or symptoms? Symptoms of this condition usually start suddenly and include:  Shortness of breath  while active or at rest.  Coughing or coughing up blood or blood-tinged mucus.  Chest pain that is often worse with deep breaths.  Rapid or irregular heartbeat.  Feeling light-headed or dizzy.  Fainting.  Feeling anxious.  Sweating.  Pain and swelling in a leg. This is a symptom of DVT, which can lead to PE.  How is this diagnosed? This condition may be diagnosed based on:  Your medical history.  A physical exam.  Blood tests to check blood oxygen level and how well your blood clots, and a D-dimer blood test, which checks your blood for a substance that is released when a blood clot breaks apart.  CT pulmonary angiogram. This test checks blood flow in and around your lungs.  Ventilation-perfusion scan, also called a lung VQ scan. This test measures air flow and blood flow to the lungs.  Ultrasound of the legs to look for blood clots.  How is this treated? Treatment for this conditions depends on many factors, such as the cause of your PE, your risk for bleeding or developing more clots, and other medical conditions you have. Treatment aims to remove, dissolve, or stop blood clots from forming or growing larger. Treatment may include:  Blood thinning medicines (anticoagulants) to stop clots from forming or growing. These medicines may be given as a pill, as an injection, or through an IV tube (infusion).  Medicines that dissolve clots (thrombolytics).  A procedure in which a flexible tube is used to remove a blood clot (embolectomy) or deliver medicine to destroy it (catheter-directed thrombolysis).  A procedure in which a filter is inserted into a large vein that carries blood to the heart (inferior vena cava). This filter (vena cava  filter) catches blood clots before they reach the lungs.  Surgery to remove the clot (surgical embolectomy). This is rare.  You may need a combination of immediate, long-term (up to 3 months after diagnosis), and extended (more than 3 months after diagnosis) treatments. Your treatment may continue for several months (maintenance therapy). You and your health care provider will work together to choose the treatment program that is best for you. Follow these instructions at home: If you are taking an anticoagulant medicine:  Take the medicine every day at the same time each day.  Understand what foods and drugs interact with your medicine.  Understand the side effects of this medicine, including excessive bruising or bleeding. Ask your health care provider or pharmacist about other side effects. General instructions  Take over-the-counter and prescription medicines only as told by your health care provider.  Anticoagulant medicines may cause side effects, including easy bruising and difficulty stopping bleeding. If you are prescribed an anticoagulant: ? Hold pressure over cuts for longer than usual. ? Tell your dentist and other health care providers that you are taking anticoagulants before you have any procedure that may cause bleeding. ? Avoid contact sports. ? Be extra careful when handling sharp objects. ? Use a soft toothbrush. Floss with waxed dental floss. ? Shave with an Copy.  Wear a medical alert bracelet or carry a medical alert card that says you have had a PE.  Ask your health care provider when you may return to your normal activities.  Talk with your health care provider about any travel plans. It is important to make sure that you are still able to take your medicine while on trips.  Keep all follow-up visits as told by your health care provider. This is important. How is  this prevented? Take these actions to lower your risk of developing another  PE:  Exercise regularly. Take frequent walks. For at least 30 minutes every day, engage in: ? Activity that involves moving your arms and legs. ? Activity that encourages good blood flow through your body by increasing your heart rate.  While traveling, drink plenty of water and avoid drinking alcohol. Ask your health care provider if you should wear below-the-knee compression stockings.  Avoid sitting or lying in bed for long periods of time without moving your legs. Exercise your arms and legs every hour during long-distance travel (over 4 hours).  If you are hospitalized or have surgery, ask your health care provider about your risks and what treatments can help prevent blood clots.  Maintain a healthy weight. Ask your health care provider what weight is healthy for you.  If you are a woman who is over age 41, avoid unnecessary use of medicines that contain estrogen, including birth control pills.  Do not use any products that contain nicotine or tobacco, such as cigarettes and e-cigarettes. This is especially important if you take estrogen medicines. If you need help quitting, ask your health care provider.  See your health care provider for regular checkups. This may include blood tests and ultrasound testing on your legs to check for new blood clots.  Contact a health care provider if:  You missed a dose of your blood thinner medicine. Get help right away if:  You have new or increased pain, swelling, warmth, or redness in an arm or leg.  You have numbness or tingling in an arm or leg.  You have shortness of breath while active or at rest.  You have chest pain.  You have a rapid or irregular heartbeat.  You feel light-headed or dizzy.  You cough up blood.  You have blood in your vomit, stool, or urine.  You have a fever.  You have abdomen (abdominal) pain.  You have a severe fall or head injury.  You have a severe headache.  You have vision changes.  You  cannot move your arms or legs.  You are confused or have memory loss.  You are bleeding for 10 minutes or more, even with strong pressure on the wound. These symptoms may represent a serious problem that is an emergency. Do not wait to see if the symptoms will go away. Get medical help right away. Call your local emergency services (911 in the [place] Do not drive yourself to the hospital. Summary  A pulmonary embolism (PE) is a sudden blockage or decrease of blood flow in one lung or both lungs. PE is a dangerous and life-threatening condition that needs to be treated right away.  Having deep vein thrombosis (DVT) or a history of DVT is the most common risk factor for PE.  Treatments for this condition usually include medicines to thin your blood (anticoagulants) or medicines to break apart blood clots (thrombolytics).  If you are prescribed blood thinners, it is important to take the medicine every single day at the same time each day.  If you have signs of PE or DVT, call your local emergency services (911 in the [place] This information is not intended to replace advice given to you by your health care provider. Make sure you discuss any questions you have with your health care provider. Document Released: 12/28/1999 Document Revised: 02/02/2016 Document Reviewed: 02/02/2016 Elsevier Interactive Patient Education  2018 [employer].

## 2017-03-08 NOTE — Plan of Care (Signed)
  Education: Knowledge of General Education information will improve 03/08/2017 1425 - Adequate for Discharge by Shanon Rosser, RN   Health Behavior/Discharge Planning: Ability to manage health-related needs will improve 03/08/2017 1425 - Adequate for Discharge by Shanon Rosser, RN   Clinical Measurements: Ability to maintain clinical measurements within normal limits will improve 03/08/2017 1425 - Adequate for Discharge by Shanon Rosser, RN Will remain free from infection 03/08/2017 1425 - Adequate for Discharge by Shanon Rosser, RN Diagnostic test results will improve 03/08/2017 1425 - Adequate for Discharge by Shanon Rosser, RN Respiratory complications will improve 03/08/2017 1425 - Adequate for Discharge by Shanon Rosser, RN Cardiovascular complication will be avoided 03/08/2017 1425 - Adequate for Discharge by Shanon Rosser, RN   Activity: Risk for activity intolerance will decrease 03/08/2017 1425 - Adequate for Discharge by Shanon Rosser, RN   Nutrition: Adequate nutrition will be maintained 03/08/2017 1425 - Adequate for Discharge by Shanon Rosser, RN   Coping: Level of anxiety will decrease 03/08/2017 1425 - Adequate for Discharge by Shanon Rosser, RN   Elimination: Will not experience complications related to bowel motility 03/08/2017 1425 - Adequate for Discharge by Shanon Rosser, RN Will not experience complications related to urinary retention 03/08/2017 1425 - Adequate for Discharge by Shanon Rosser, RN   Pain Managment: General experience of comfort will improve 03/08/2017 1425 - Adequate for Discharge by Shanon Rosser, RN   Safety: Ability to remain free from injury will improve 03/08/2017 1425 - Adequate for Discharge by Shanon Rosser, RN   Skin Integrity: Risk for impaired skin integrity will decrease 03/08/2017 1425 - Adequate for Discharge by Shanon Rosser, RN

## 2017-03-08 NOTE — Progress Notes (Addendum)
Benefit check sent to [employer] and CC'd to Milus Banister RN CM. This will process Monday during business hours.  13:00 Spoke to patient's spouse at bedside. He states they have used the 30 day free card in the past, this would make them ineligible to use it again. He states he cannot remember the copay amount since it has been a while since it was filled.  Patient spouse states they earn approximately 6K a month through their SS and disability.  Patient spouse states they quit getting the med filled because the doctor told them to stop taking it. He repeats that it was not due to financial limitations. CM will report this to Dr Algis Liming as requested. 13:10 As requetsed by MD, instructed patient's spouse to fill Rx on way home from hospital as patient will need med tonight. He verbalized understanding and f/u w PCP if problems arise.

## 2017-03-08 NOTE — Discharge Summary (Signed)
Physician Discharge Summary  Tammy [name] FTD:[id number] DOB: 1945/01/16  PCP: [name], MD  Admit date: 03/07/2017 Discharge date: 03/08/2017  Recommendations for Outpatient Follow-up:  1. Dr. Delman Cheadle, PCP in 3 days with repeat labs (CBC & BMP). 2. Dr. Simone Curia, Hematology  Home Health: None Equipment/Devices: None  Discharge Condition: Improved and stable CODE STATUS: Full Diet recommendation: Heart healthy diet.  Discharge Diagnoses:  Principal Problem:   Acute pulmonary embolism (HCC) Active Problems:   Alzheimer's disease   Prediabetes   Essential hypertension   Acute DVT (deep venous thrombosis) (HCC)   Normocytic anemia   Brief Summary: 72 year old married female, with PMH of advanced dementia, right lower extremity DVT & bilateral PE February 2018 which was felt to be a provoked event following a road trip, treated with 6-7 months of anticoagulation with Xarelto and was discontinued in September 2018 after hematology follow-up, noted to have increased left leg swelling and was referred by PCP to vascular lab for venous Doppler where she was found to have acute DVT in the left posterior tibial vein and peroneal vein.  Subsequently referred by PCP to ED for CTA chest which showed small volume right sided pulmonary embolism without CT evidence of right heart strain or pulmonary infarction.  She was admitted for evaluation and observation.  Assessment and plan:  1. 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  Risk factors include advanced age, relative immobility and significant weight loss where underlying non-benign process needs to be considered.  She was placed on IV heparin drip overnight.  I discussed extensively with patient's spouse today regarding options for oral anticoagulation including warfarin versus NOAC's (Xarelto which patient has used and tolerated in the past, Eliquis) versus  Pradaxa.  Discussed in detail regarding indications, risks and benefits of each.  Spouse preferred for patient to go back on Xarelto.  Advised him that she may likely be on it long-term/lifelong unless she develops contraindications.  Case management consulted for medication needs and discussed with spouse, no financial difficulties and procuring this medication.  She was transitioned from IV heparin to Xarelto and received first dose in the hospital.  Spouse advised to make sure that she continues Xarelto at home beginning tonight's dose.  He verbalized understanding.  Recommend outpatient hematology follow-up for further evaluation.  She may need age-appropriate malignancy screening including screening colonoscopy, mammogram and may consider CT abdomen and pelvis depending on how aggressive family wishes to be.  Patient has remained hemodynamically stable and not hypoxic. 2. Severe weight loss: As per report, patient has lost approximately 20-30 pounds in the last 6 months but spouse reports that patient has pretty much stopped eating and has had very poor appetite which may be part of her advanced dementia.  Outpatient evaluation and management as deemed necessary. 3. Alzheimer's dementia: Continue Aricept.  No behavioral abnormalities noted. 4. Normocytic anemia: Possibly chronic disease.  FOBT negative.  Anemia panel as below.  Follow CBCs closely as outpatient.  No overt bleeding reported. 5. Hypokalemia: Replaced prior to discharge.  Magnesium 1.8.  Follow BMP in a few days as outpatient. 6. Severe protein calorie malnutrition/Body mass index is 23.95 kg/m.: Continue nutritional supplements.   Consultations:  None  Procedures:  None   Discharge Instructions  Discharge Instructions    Call MD for:  difficulty breathing, headache or visual disturbances   Complete by:  As directed    Call MD for:  extreme fatigue   Complete by:  As directed    Call MD for:  persistant dizziness or  light-headedness   Complete by:  As directed    Call MD for:  severe uncontrolled pain   Complete by:  As directed    Diet - low sodium heart healthy   Complete by:  As directed    Increase activity slowly   Complete by:  As directed        Medication List    TAKE these medications   donepezil 10 MG tablet Commonly known as:  ARICEPT Take 1 tablet (10 mg total) by mouth at bedtime.   feeding supplement (ENSURE ENLIVE) Liqd Take 237 mLs by mouth 2 (two) times daily between meals.   furosemide 20 MG tablet Commonly known as:  LASIX Take 1 tablet (20 mg total) by mouth daily as needed for edema.   mirtazapine 30 MG tablet Commonly known as:  REMERON Take 1 tablet (30 mg total) by mouth at bedtime.   Rivaroxaban 15 & 20 MG Tbpk Take as directed on package: Start with one 15mg  tablet by mouth twice a day with food. On Day 22, switch to one 20mg  tablet once a day with food.  PHARMACY: Please discard the first tablet from the pack which she has received in the hospital 03/08/2017 am.   senna-docusate 8.6-50 MG tablet Commonly known as:  Senokot-S Take 4 tablets by mouth at bedtime.      Follow-up Information    [name], MD. Schedule an appointment as soon as possible for a visit in 3 day(s).   Specialty:  Family Medicine Why:  To be seen with repeat labs (CBC & BMP). Contact information: [place] [address] [phone]        [name], [name], MD. Schedule an appointment as soon as possible for a visit.   Specialty:  Hematology and Oncology Contact information: [address] [place] [address] [phone]          Allergies  Allergen Reactions  . Sulfa Antibiotics Palpitations      Procedures/Studies: Ct Angio Chest Pe W And/or Wo Contrast  Result Date: 03/07/2017 CLINICAL DATA:  Known DVTs, evaluate for pulmonary embolism. EXAM: CT ANGIOGRAPHY CHEST WITH CONTRAST TECHNIQUE: Multidetector CT imaging of the chest was performed  using the standard protocol during bolus administration of intravenous contrast. Multiplanar CT image reconstructions and MIPs were obtained to evaluate the vascular anatomy. CONTRAST:  153mL ISOVUE-370 IOPAMIDOL (ISOVUE-370) INJECTION 76% COMPARISON:  Chest CT - 02/23/2016 FINDINGS: Vascular Findings: There is adequate opacification of the pulmonary arterial system with the main pulmonary artery measuring 426 Hounsfield units. Nonocclusive pulmonary embolism is seen involving the right upper lobe pulmonary artery (representative axial image 101, series 7; coronal image 83, series 8) as well as the distal aspect of the right interlobar pulmonary artery extending to involve the right middle and lower lobe pulmonary arteries (representative axial image 140, series 7; coronal image 86, series 8). The left pulmonary arterial tree appears widely patent. Overall clot burden is deemed small in volume. There is no CT evidence of right-sided heart strain nor is there reflux of injected contrast into the hepatic venous system. Normal caliber of the main pulmonary artery. Normal heart size.  Trace pericardial effusion. No evidence of thoracic aortic aneurysm or dissection on this nongated examination. Bovine configuration of the aortic arch. The branch vessels of the aortic arch appear patent throughout their imaged course. Review of the  MIP images confirms the above findings. ---------------------------------------------------------------------------------- Nonvascular Findings: Mediastinum/Lymph Nodes: Scattered mediastinal lymph nodes are numerous though individually not enlarged by size criteria with index precarinal lymph node measuring 0.9 cm in greatest short axis diameter (image 54, series 5). Lungs/Pleura: Minimal dependent subpleural ground-glass atelectasis. No discrete focal airspace opacities. No pleural effusion or pneumothorax. The central pulmonary airways appear widely patent. No discrete pulmonary nodules.  Upper abdomen: Limited visualization of the lower thorax demonstrates 3 discrete hypointense cysts within the imaged right lobe of the liver with dominant cyst measuring approximately 3.1 cm in diameter. Musculoskeletal: No acute or aggressive osseous abnormalities. Incidental note made of a bone island within the right T10 pedicle. Regional soft tissues appear normal. Normal appearance of the imaged portions of the thyroid gland IMPRESSION: Examination is positive for small volume right-sided pulmonary embolism without CT evidence of right-sided heart strain or pulmonary infarction. Electronically Signed   By: Sandi Mariscal M.D.   On: 03/07/2017 15:17  Bilateral lower extremity venous Dopplers 03/07/17:  Final Interpretation: Right: There is no evidence of deep vein thrombosis in the lower extremity.There is no evidence of superficial venous thrombosis. Left: There is evidence of acute DVT in the Posterior Tibial veins, and Peroneal veins. There is no evidence of superficial venous thrombosis.    Subjective: Patient interviewed and examined with spouse and RN at bedside.  Patient pleasantly confused and unable to provide any history.  As per spouse, apart from asymmetric left leg swelling without pain, patient really did not have any complaints and was advised by PCP to come to the hospital for evaluation.  No chest pain, dyspnea or leg pain reported.  Denies recent long distance travel.  Discharge Exam:  Vitals:   03/07/17 1730 03/07/17 1800 03/07/17 2123 03/08/17 0339  BP: 134/84 (!) 146/83 119/80 120/83  Pulse: (!) 58  81 71  Resp: 20 18 (!) 21 20  Temp:  98.3 F (36.8 C) 97.8 F (36.6 C) 98.2 F (36.8 C)  TempSrc:   Oral Oral  SpO2: 98% 100% 100% 100%  Weight:  68 kg (150 lb)  67.3 kg (148 lb 6.4 oz)  Height:  5\' 6"  (1.676 m)      General: Pleasant elderly female, moderately built, poorly nourished, sitting up comfortably in bed eating breakfast.  Did not appear in any  distress. Cardiovascular: S1 & S2 heard, RRR, S1/S2 +. No murmurs, rubs, gallops or clicks. No JVD or pedal edema.  Telemetry personally reviewed: Sinus rhythm. Respiratory: Clear to auscultation without wheezing, rhonchi or crackles. No increased work of breathing. Abdominal:  Non distended, non tender & soft. No organomegaly or masses appreciated. Normal bowel sounds heard. CNS: Alert and oriented only to self and partly to place. No focal deficits. Extremities: no edema, no cyanosis.  Left leg mildly asymmetrically swollen compared to right without acute findings or tenderness.  Symmetric peripheral pulses felt.    The results of significant diagnostics from this hospitalization (including imaging, microbiology, ancillary and laboratory) are listed below for reference.     Labs: CBC: Recent Labs  Lab 03/07/17 1146 03/08/17 0313  WBC 5.0 6.1  HGB 10.5* 9.2*  HCT 32.4* 28.7*  MCV 86.6 85.9  PLT 243 379   Basic Metabolic Panel: Recent Labs  Lab 03/05/17 1658 03/07/17 1146 03/08/17 0313  NA 148* 144 143  K 3.4* 3.0* 3.4*  CL 108* 101 109  CO2 25 22 25   GLUCOSE 89 94 83  BUN 14 10 8   CREATININE 0.80  0.82 0.90  CALCIUM 9.6 8.6* 8.8*  MG  --   --  1.8   Liver Function Tests: Recent Labs  Lab 03/05/17 1658 03/08/17 0313  AST 26 22  ALT 49* 28  ALKPHOS 109 75  BILITOT 0.3 0.9  PROT 7.1 5.7*  ALBUMIN 3.9 3.0*    Thyroid function studies Recent Labs    03/07/17 1832  TSH 1.576   Anemia work up Recent Labs    03/07/17 1824 03/07/17 1827  VITAMINB12 333  --   FOLATE  --  18.2  FERRITIN 313*  --   TIBC 266  --   IRON 57  --   RETICCTPCT 1.4  --    Discussed in detail with patient's spouse at bedside.  Updated care and answered questions.    Time coordinating discharge: Over 30 minutes  SIGNED:  Vernell Leep, MD, FACP, [facility]. [facility] Pager [phone]  If 7PM-7AM, please contact night-coverage [email or web address] Password [facility] 03/08/2017,  1:53 PM

## 2017-03-08 NOTE — Care Management Obs Status (Signed)
[place] NOTIFICATION   Patient Details  Name: Tammy [name] MRN: [id number] Date of Birth: 05/22/1945   Medicare Observation Status Notification Given:  Yes    Carles Collet, RN 03/08/2017, 1:38 PM

## 2017-03-08 NOTE — Progress Notes (Signed)
ANTICOAGULATION CONSULT NOTE - Initial Consult  Pharmacy Consult for Heparin  Indication: pulmonary embolus  Allergies  Allergen Reactions  . Sulfa Antibiotics Palpitations    Patient Measurements: Height: 5\' 6"  (167.6 cm)(per pt husband) Weight: 148 lb 6.4 oz (67.3 kg) IBW/kg (Calculated) : 59.3 Heparin Dosing Weight: 68 kg  Vital Signs: Temp: 98.2 F (36.8 C) (02/24 0339) Temp Source: Oral (02/24 0339) BP: 120/83 (02/24 0339) Pulse Rate: 71 (02/24 0339)  Labs: Recent Labs    03/05/17 1658 03/07/17 1146 03/08/17 0313  HGB  --  10.5* 9.2*  HCT  --  32.4* 28.7*  PLT  --  243 219  APTT  --  28  --   LABPROT  --  13.4  --   INR  --  1.03  --   HEPARINUNFRC  --   --  0.96*  CREATININE 0.80 0.82 0.90    Estimated Creatinine Clearance: 54.4 mL/min (by C-G formula based on SCr of 0.9 mg/dL).   Medical History: Past Medical History:  Diagnosis Date  . Alzheimer's disease 12/28/2014  . Dementia   . Headache disorder 12/28/2014  . Memory disturbance 12/28/2014  . Pulmonary embolus ([place]) 02/23/2016  . Right femoral vein DVT (HCC) 02/24/2016    Medications:  Medications Prior to Admission  Medication Sig Dispense Refill Last Dose  . donepezil (ARICEPT) 10 MG tablet Take 1 tablet (10 mg total) by mouth at bedtime. 30 tablet 11 03/06/2017 at Unknown time  . furosemide (LASIX) 20 MG tablet Take 1 tablet (20 mg total) by mouth daily as needed for edema. 30 tablet 3 Past Week at Unknown time  . mirtazapine (REMERON) 30 MG tablet Take 1 tablet (30 mg total) by mouth at bedtime. 30 tablet 2 03/06/2017 at Unknown time  . senna-docusate (SENOKOT-S) 8.6-50 MG tablet Take 4 tablets by mouth at bedtime. 360 tablet 1 03/06/2017 at Unknown time    Assessment: 18 YOF with prior history of DVT here with recurrent leg swelling. She was found to have a new PE with multiple R sided clots. Pharmacy consulted to start IV heparin. She was on Xarelto in the past which was d/c'ed after old clot  resolution. Small drop in hgb noted. Spoke with RN, no bleeding.   Goal of Therapy:  Heparin level 0.3-0.7 units/ml Monitor platelets by anticoagulation protocol: Yes    Plan:  -Reduce heparin to 1050 units/hr -Daily HL, CBC -Check level this afternoon   Harvel Quale 03/08/2017 5:40 AM

## 2017-03-08 NOTE — Care Management CC44 (Signed)
Condition Code 44 Documentation Completed  Patient Details  Name: NEOMIA HERBEL MRN: [id number] Date of Birth: April 23, 1945   Condition Code 44 given:  Yes Patient signature on Condition Code 44 notice:  Yes Documentation of 2 MD's agreement:  Yes Code 44 added to claim:  Yes    Carles Collet, RN 03/08/2017, 1:38 PM

## 2017-03-09 ENCOUNTER — Telehealth: Payer: Self-pay

## 2017-03-09 NOTE — Telephone Encounter (Signed)
Transition Care Management Follow-up Telephone Call   Date discharged: 03/08/17   How have you been since you were released from the hospital? Patient is doing better. Most of the swelling in her feet has resolved.    Do you understand why you were in the hospital? yes   Do you understand the discharge instructions? yes   Where were you discharged to? home   Items Reviewed:  Medications reviewed: yes  Allergies reviewed: yes  Dietary changes reviewed: yes  Referrals reviewed: yes   Functional Questionnaire:   Activities of Daily Living (ADLs):   She states they are independent in the following: ambulation, bathing and hygiene, feeding, continence, grooming, toileting and dressing States they require assistance with the following: none   Any transportation issues/concerns?: no   Any patient concerns? no   Confirmed importance and date/time of follow-up visits scheduled yes  Provider Appointment booked with Dr. Brigitte Pulse on 03/16/17 @ 2:40 pm   Confirmed with patient if condition begins to worsen call PCP or go to the ER.  Patient was given the office number and encouraged to call back with question or concerns.  : yes

## 2017-03-11 ENCOUNTER — Telehealth: Payer: Self-pay | Admitting: Hematology and Oncology

## 2017-03-11 NOTE — Telephone Encounter (Signed)
Appointment scheduled per 2/27 sch msg from Dr Lebron Conners  Patient's husband has been notified also

## 2017-03-16 ENCOUNTER — Ambulatory Visit (INDEPENDENT_AMBULATORY_CARE_PROVIDER_SITE_OTHER): Payer: Medicare Other | Admitting: Family Medicine

## 2017-03-16 ENCOUNTER — Other Ambulatory Visit: Payer: Self-pay

## 2017-03-16 ENCOUNTER — Encounter: Payer: Self-pay | Admitting: Family Medicine

## 2017-03-16 VITALS — BP 124/78 | HR 72 | Temp 97.3°F | Resp 18 | Ht 66.0 in | Wt 152.8 lb

## 2017-03-16 DIAGNOSIS — Z5181 Encounter for therapeutic drug level monitoring: Secondary | ICD-10-CM

## 2017-03-16 DIAGNOSIS — I10 Essential (primary) hypertension: Secondary | ICD-10-CM | POA: Diagnosis not present

## 2017-03-16 DIAGNOSIS — I2699 Other pulmonary embolism without acute cor pulmonale: Secondary | ICD-10-CM

## 2017-03-16 DIAGNOSIS — E440 Moderate protein-calorie malnutrition: Secondary | ICD-10-CM | POA: Diagnosis not present

## 2017-03-16 DIAGNOSIS — R634 Abnormal weight loss: Secondary | ICD-10-CM

## 2017-03-16 LAB — POCT CBC
GRANULOCYTE PERCENT: 58.6 % (ref 37–80)
HEMATOCRIT: 32.9 % — AB (ref 37.7–47.9)
HEMOGLOBIN: 10.7 g/dL — AB (ref 12.2–16.2)
Lymph, poc: 1.9 (ref 0.6–3.4)
MCH, POC: 28.1 pg (ref 27–31.2)
MCHC: 32.6 g/dL (ref 31.8–35.4)
MCV: 86 fL (ref 80–97)
MID (cbc): 0.3 (ref 0–0.9)
MPV: 9.4 fL (ref 0–99.8)
POC GRANULOCYTE: 3.1 (ref 2–6.9)
POC LYMPH PERCENT: 35.7 %L (ref 10–50)
POC MID %: 5.7 % (ref 0–12)
Platelet Count, POC: 296 10*3/uL (ref 142–424)
RBC: 3.82 M/uL — AB (ref 4.04–5.48)
RDW, POC: 16.1 %
WBC: 5.3 10*3/uL (ref 4.6–10.2)

## 2017-03-16 MED ORDER — TRIAMTERENE-HCTZ 37.5-25 MG PO TABS: 1.0000 | ORAL_TABLET | Freq: Every day | ORAL | 0 refills | Status: DC

## 2017-03-16 NOTE — Progress Notes (Signed)
Subjective:  By signing my name below, I, [name], attest that this documentation has been prepared under the direction and in the presence of Delman Cheadle, MD. Electronically Signed: [name], [place]. 03/16/2017 , 3:29 PM .  Patient was seen in Room 1 .   Patient ID: Tammy [name], female    DOB: May 05, 1945, 72 y.o.   MRN: [id number] Chief Complaint  Patient presents with  . Transitions Of Care    Pt was in the hospital on 03/07/2017 for pulmonary embolism and anemia.   HPI Tammy [name] is a 72 y.o. female who presents to Primary Care at [facility] for hospital follow up and transition of care. Patient was hospitalized from 2/23 to 2/24 after she was found to have left lower extremity DVT so she was sent to the ER where CTA showed small volume right sided PE. She was admitted overnight for IV heparin drip and then transitioned to Xarelto which had done well previously when she was found to have a right lower extremity DVT 1 year prior in Feb 2018.   At that time, she had completed 6-7 months of Xarelto and repeat doppler had confirmed that clot had resolved so after discussing/consulting with hematology, anticoagulation had been discontinued as initial DVT was considered provoked following a road trip. Patient did not have any known trigger of decreased ambulation prior to this recent presentation. She had been noted to have lower extremity edema that responded to HCTZ, but several months ago had recurrence when diuretic was discontinued. As patient was unable to provide detail of history due to dementia and her history of DVT, a D-dimer was obtained which was positive and illicit venous US.   Patient states she holds and grabs her left leg because she describes a cramping sensation of pain, "feeling like it's stuck". She's been taking Lasix. She denies chest pain or shortness of breath. Her husband notes patient's feet are puffy constantly.   She has an appointment with hematologist, Dr.  Lebron Conners on March 8th and GYN on April 11th.   Past Medical History:  Diagnosis Date  . Alzheimer's disease 12/28/2014  . Dementia   . Headache disorder 12/28/2014  . Memory disturbance 12/28/2014  . Pulmonary embolus ([place]) 02/23/2016  . 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 No pertinent surgical history. Prior to Admission medications   Medication Sig Start Date End Date Taking? Authorizing Provider  donepezil (ARICEPT) 10 MG tablet Take 1 tablet (10 mg total) by mouth at bedtime. 12/29/16   Ward Givens, NP  feeding supplement, ENSURE ENLIVE, (ENSURE ENLIVE) LIQD Take 237 mLs by mouth 2 (two) times daily between meals. 03/08/17   Hongalgi, Lenis Dickinson, MD  furosemide (LASIX) 20 MG tablet Take 1 tablet (20 mg total) by mouth daily as needed for edema. 03/05/17   [name], MD  mirtazapine (REMERON) 30 MG tablet Take 1 tablet (30 mg total) by mouth at bedtime. 02/02/17   [name], MD  Rivaroxaban 15 & 20 MG TBPK Take as directed on package: Start with one 15mg  tablet by mouth twice a day with food. On Day 22, switch to one 20mg  tablet once a day with food.  PHARMACY: Please discard the first tablet from the pack which she has received in the hospital 03/08/2017 am. 03/08/17   Modena Jansky, MD  senna-docusate (SENOKOT-S) 8.6-50 MG tablet Take 4 tablets by mouth at bedtime. 02/23/17   [name], MD   Allergies  Allergen Reactions  . Sulfa Antibiotics Palpitations   Family History  Problem Relation Age of Onset  . Heart disease Brother   . Heart attack Mother   . Dementia Mother    Social History   Socioeconomic History  . Marital status: Married    Spouse name: [name]  . Number of children: 3  . Years of education: 25  . Highest education level: None  Social Needs  . Financial resource strain: None  . Food insecurity - worry: None  . Food insecurity - inability: None  . Transportation needs - medical: None  . Transportation needs - non-medical: None   Occupational History  . Occupation: retired  Tobacco Use  . Smoking status: Never Smoker  . Smokeless tobacco: Never Used  Substance and Sexual Activity  . Alcohol use: No  . Drug use: No  . Sexual activity: Yes  Other Topics Concern  . None  Social History Narrative   Married with 3 children   Right handed   8th grade   1 cup coffee daily   Depression screen [facility] 2/9 03/16/2017 03/05/2017 02/23/2017 02/17/2017 02/02/2017  Decreased Interest 0 0 0 0 0  Down, Depressed, Hopeless 0 0 0 0 0  PHQ - 2 Score 0 0 0 0 0    Review of Systems  Constitutional: Negative for chills, fatigue, fever and unexpected weight change.  Respiratory: Negative for cough, chest tightness and shortness of breath.   Cardiovascular: Positive for leg swelling. Negative for chest pain.  Gastrointestinal: Negative for constipation, diarrhea, nausea and vomiting.  Musculoskeletal: Positive for joint swelling and myalgias.  Skin: Negative for rash and wound.  Neurological: Negative for dizziness, weakness and headaches.       Objective:   Physical Exam  Constitutional: She is oriented to person, place, and time. She appears well-developed and well-nourished. No distress.  HENT:  Head: Normocephalic and atraumatic.  Eyes: EOM are normal. Pupils are equal, round, and reactive to light.  Neck: Neck supple.  Cardiovascular: Normal rate.  Pulses:      Dorsalis pedis pulses are 2+ on the right side, and 2+ on the left side.  Pulmonary/Chest: Effort normal. No respiratory distress.  Musculoskeletal: Normal range of motion. She exhibits edema.  Left lower extremity: 1+ pitting edema Right lower extremity: trace pitting edema  Neurological: She is alert and oriented to person, place, and time.  Skin: Skin is warm and dry.  Psychiatric: She has a normal mood and affect. Her behavior is normal.  Nursing note and vitals reviewed.   BP 124/78 (BP Location: Left Arm, Patient Position: Sitting, Cuff Size: Normal)    Pulse 72   Temp (!) 97.3 F (36.3 C) (Oral)   Resp 18   Ht 5\' 6"  (1.676 m)   Wt 152 lb 12.8 oz (69.3 kg)   SpO2 100%   BMI 24.66 kg/m      Assessment & Plan:   1. Other acute pulmonary embolism without acute cor pulmonale ([place])   2. Essential hypertension   3. Unintentional weight loss of more than 5% body weight within 1 month   4. Moderate protein-calorie malnutrition ([place])   5. Medication monitoring encounter     Orders Placed This Encounter  Procedures  . Comprehensive metabolic panel  . POCT CBC    Meds ordered this encounter  Medications  . triamterene-hydrochlorothiazide (MAXZIDE-25) 37.5-25 MG tablet    Sig: Take 1 tablet by mouth daily.    Dispense:  90 tablet  Refill:  0    D/c prior lasix and hctz rxs    I personally performed the services described in this documentation, which was scribed in my presence. The recorded information has been reviewed and considered, and addended by me as needed.   Delman Cheadle, M.D.  Primary Care at [facility] [address] [place], [place] [address] [phone] phone [phone] fax  03/18/17 4:45 AM

## 2017-03-16 NOTE — Patient Instructions (Addendum)
IF you received an x-ray today, you will receive an invoice from [facility]. Please contact [facility] at [phone] with questions or concerns regarding your invoice.   IF you received labwork today, you will receive an invoice from [facility]. Please contact [facility] at [phone] with questions or concerns regarding your invoice.   Our billing staff will not be able to assist you with questions regarding bills from these companies.  You will be contacted with the lab results as soon as they are available. The fastest way to get your results is to activate your My Chart account. Instructions are located on the last page of this paperwork. If you have not heard from [place] regarding the results in 2 weeks, please contact this office.     Protein Content in Foods Generally, most healthy people need around 50 grams of protein each day. Depending on your overall health, you may need more or less protein in your diet. Talk to your health care provider or dietitian about how much protein you need. See the following list for the protein content of some common foods. High-protein foods High-protein foods contain 4 grams (4 g) or more of protein per serving. They include:  Beef, ground sirloin (cooked) - 3 oz have 24 g of protein.  Cheese (hard) - 1 oz has 7 g of protein.  Chicken breast, boneless and skinless (cooked) - 3 oz have 13.4 g of protein.  Cottage cheese - 1/2 cup has 13.4 g of protein.  Egg - 1 egg has 6 g of protein.  Fish, filet (cooked) - 1 oz has 6-7 g of protein.  Garbanzo beans (canned or cooked) - 1/2 cup has 6-7 g of protein.  Kidney beans (canned or cooked) - 1/2 cup has 6-7 g of protein.  Lamb (cooked) - 3 oz has 24 g of protein.  Milk - 1 cup (8 oz) has 8 g of protein.  Nuts (peanuts, pistachios, almonds) - 1 oz has 6 g of protein.  Peanut butter - 1 oz has 7-8 g of protein.  Pork tenderloin (cooked) - 3 oz has 18.4 g of  protein.  Pumpkin seeds - 1 oz has 8.5 g of protein.  Soybeans (roasted) - 1 oz has 8 g of protein.  Soybeans (cooked) - 1/2 cup has 11 g of protein.  Soy milk - 1 cup (8 oz) has 5-10 g of protein.  Soy or vegetable patty - 1 patty has 11 g of protein.  Sunflower seeds - 1 oz has 5.5 g of protein.  Tofu (firm) - 1/2 cup has 20 g of protein.  Tuna (canned in water) - 3 oz has 20 g of protein.  Yogurt - 6 oz has 8 g of protein.  Low-protein foods Low-protein foods contain 3 grams (3 g) or less of protein per serving. They include:  Beets (raw or cooked) - 1/2 cup has 1.5 g of protein.  Bran cereal - 1/2 cup has 2-3 g of protein.  Bread - 1 slice has 2.5 g of protein.  Broccoli (raw or cooked) - 1/2 cup has 2 g of protein.  Collard greens (raw or cooked) - 1/2 cup has 2 g of protein.  Corn (fresh or cooked) - 1/2 cup has 2 g of protein.  Cream cheese - 1 oz has 2 g of protein.  Creamer (half-and-half) - 1 oz has 1 g of protein.  Flour tortilla - 1 tortilla has 2.5 g of protein  Frozen  yogurt - 1/2 cup has 3 g of protein.  Fruit or vegetable juice - 1/2 cup has 1 g of protein.  Green beans (raw or cooked) - 1/2 cup has 1 g of protein.  Green peas (canned) - 1/2 cup has 3.5 g of protein.  Muffins - 1 small muffin (2 oz) has 3 g of protein.  Oatmeal (cooked) - 1/2 cup has 3 g of protein.  Potato (baked with skin) - 1 medium potato has 3 g of protein.  Rice (cooked) - 1/2 cup has 2.5-3.5 g of protein.  Sour cream - 1/2 cup has 2.5 g of protein.  Spinach (cooked) - 1/2 cup has 3 g of protein.  Squash (cooked) - 1/2 cup has 1.5 g of protein.  Actual amounts of protein may be different depending on processing. Talk with your health care provider or dietitian about what foods are recommended for you. This information is not intended to replace advice given to you by your health care provider. Make sure you discuss any questions you have with your health care  provider. Document Released: 03/31/2015 Document Revised: 09/10/2015 Document Reviewed: 09/10/2015 Elsevier Interactive Patient Education  2018 [place].  High-Protein and High-Calorie Diet Eating high-protein and high-calorie foods can help you to gain weight, heal after an injury, and recover after an illness or surgery. What is my plan? The specific amount of daily protein and calories you need depends on:  Your body weight.  The reason this diet is recommended for you.  Generally, a high-protein, high-calorie diet involves:  Eating 250-500 extra calories each day.  Making sure that 10-35% of your daily calories come from protein.  Talk to your health care provider about how much protein and how many calories you need each day. Follow the diet as directed by your health care provider. What do I need to know about this diet?  Ask your health care provider if you should take a nutritional supplement.  Try to eat six small meals each day instead of three large meals.  Eat a balanced diet, including one food that is high in protein at each meal.  Keep nutritious snacks handy, such as nuts, trail mixes, dried fruit, and yogurt.  If you have kidney disease or diabetes, eating too much protein may put extra stress on your kidneys. Talk to your health care provider if you have either of those conditions. What are some high-protein foods? Grains Quinoa. Bulgur wheat. Vegetables Soybeans. Peas. Meats and Other Protein Sources Beef, pork, and poultry. Fish and seafood. Eggs. Tofu. Textured vegetable protein (TVP). Peanut butter. Nuts and seeds. Dried beans. Protein powders. Dairy Whole milk. Whole-milk yogurt. Powdered milk. Cheese. [employer]. Eggnog. Beverages High-protein supplement drinks. Soy milk. Other Protein bars. The items listed above may not be a complete list of recommended foods or beverages. Contact your dietitian for more options. What are some  high-calorie foods? Grains Pasta. Quick breads. Muffins. Pancakes. Ready-to-eat cereal. Vegetables Vegetables cooked in oil or butter. Fried potatoes. Fruits Dried fruit. Fruit leather. Canned fruit in syrup. Fruit juice. Avocados. Meats and Other Protein Sources Peanut butter. Nuts and seeds. Dairy Heavy cream. Whipped cream. Cream cheese. Sour cream. Ice cream. Custard. Pudding. Beverages Meal-replacement beverages. Nutrition shakes. Fruit juice. Sugar-sweetened soft drinks. Condiments Salad dressing. Mayonnaise. Alfredo sauce. Fruit preserves or jelly. Honey. Syrup. Sweets/Desserts Cake. Cookies. Pie. Pastries. Candy bars. Chocolate. Fats and Oils Butter or margarine. Oil. Gravy. Other Meal-replacement bars. The items listed above may not be a complete  list of recommended foods or beverages. Contact your dietitian for more options. What are some tips for including high-protein and high-calorie foods in my diet?  Add whole milk, half-and-half, or heavy cream to cereal, pudding, soup, or hot cocoa.  Add whole milk to instant breakfast drinks.  Add peanut butter to oatmeal or smoothies.  Add powdered milk to baked goods, smoothies, or milkshakes.  Add powdered milk, cream, or butter to mashed potatoes.  Add cheese to cooked vegetables.  Make whole-milk yogurt parfaits. Top them with granola, fruit, or nuts.  Add cottage cheese to your fruit.  Add avocados, cheese, or both to sandwiches or salads.  Add meat, poultry, or seafood to rice, pasta, casseroles, salads, and soups.  Use mayonnaise when making egg salad, chicken salad, or tuna salad.  Use peanut butter as a topping for pretzels, celery, or crackers.  Add beans to casseroles, dips, and spreads.  Add pureed beans to sauces and soups.  Replace calorie-free drinks with calorie-containing drinks, such as milk and fruit juice. This information is not intended to replace advice given to you by your health care  provider. Make sure you discuss any questions you have with your health care provider. Document Released: 12/30/2004 Document Revised: 06/07/2015 Document Reviewed: 06/14/2013 Elsevier Interactive Patient Education  2018 [place]. Potassium Content of Foods Potassium is a mineral found in many foods and drinks. It helps keep fluids and minerals balanced in your body and affects how steadily your heart beats. Potassium also helps control your blood pressure and keep your muscles and nervous system healthy. Certain health conditions and medicines may change the balance of potassium in your body. When this happens, you can help balance your level of potassium through the foods that you do or do not eat. Your health care provider or dietitian may recommend an amount of potassium that you should have each day. The following lists of foods provide the amount of potassium (in parentheses) per serving in each item. High in potassium The following foods and beverages have 200 mg or more of potassium per serving:  Apricots, 2 raw or 5 dry (200 mg).  Artichoke, 1 medium (345 mg).  Avocado, raw,  each (245 mg).  Banana, 1 medium (425 mg).  Beans, lima, or baked beans, canned,  cup (280 mg).  Beans, white, canned,  cup (595 mg).  Beef roast, 3 oz (320 mg).  Beef, ground, 3 oz (270 mg).  Beets, raw or cooked,  cup (260 mg).  Bran muffin, 2 oz (300 mg).  Broccoli,  cup (230 mg).  Brussels sprouts,  cup (250 mg).  Cantaloupe,  cup (215 mg).  Cereal, 100% bran,  cup (200-400 mg).  Cheeseburger, single, fast food, 1 each (225-400 mg).  Chicken, 3 oz (220 mg).  Clams, canned, 3 oz (535 mg).  Crab, 3 oz (225 mg).  Dates, 5 each (270 mg).  Dried beans and peas,  cup (300-475 mg).  Figs, dried, 2 each (260 mg).  Fish: halibut, tuna, cod, snapper, 3 oz (480 mg).  Fish: salmon, haddock, swordfish, perch, 3 oz (300 mg).  Fish, tuna, canned 3 oz (200 mg).  [place] fries,  fast food, 3 oz (470 mg).  Granola with fruit and nuts,  cup (200 mg).  Grapefruit juice,  cup (200 mg).  Greens, beet,  cup (655 mg).  Honeydew melon,  cup (200 mg).  Kale, raw, 1 cup (300 mg).  Kiwi, 1 medium (240 mg).  Kohlrabi, rutabaga, parsnips,  cup (280  mg).  Lentils,  cup (365 mg).  Mango, 1 each (325 mg).  Milk, chocolate, 1 cup (420 mg).  Milk: nonfat, low-fat, whole, buttermilk, 1 cup (350-380 mg).  Molasses, 1 Tbsp (295 mg).  Mushrooms,  cup (280) mg.  Nectarine, 1 each (275 mg).  Nuts: almonds, peanuts, hazelnuts, [place], cashew, mixed, 1 oz (200 mg).  Nuts, pistachios, 1 oz (295 mg).  Orange, 1 each (240 mg).  Orange juice,  cup (235 mg).  Papaya, medium,  fruit (390 mg).  Peanut butter, chunky, 2 Tbsp (240 mg).  Peanut butter, smooth, 2 Tbsp (210 mg).  Pear, 1 medium (200 mg).  Pomegranate, 1 whole (400 mg).  Pomegranate juice,  cup (215 mg).  Pork, 3 oz (350 mg).  Potato chips, salted, 1 oz (465 mg).  Potato, baked with skin, 1 medium (925 mg).  Potatoes, boiled,  cup (255 mg).  Potatoes, mashed,  cup (330 mg).  Prune juice,  cup (370 mg).  Prunes, 5 each (305 mg).  Pudding, chocolate,  cup (230 mg).  Pumpkin, canned,  cup (250 mg).  Raisins, seedless,  cup (270 mg).  Seeds, sunflower or pumpkin, 1 oz (240 mg).  Soy milk, 1 cup (300 mg).  Spinach,  cup (420 mg).  Spinach, canned,  cup (370 mg).  Sweet potato, baked with skin, 1 medium (450 mg).  Swiss chard,  cup (480 mg).  Tomato or vegetable juice,  cup (275 mg).  Tomato sauce or puree,  cup (400-550 mg).  Tomato, raw, 1 medium (290 mg).  Tomatoes, canned,  cup (200-300 mg).  [place], 3 oz (250 mg).  Wheat germ, 1 oz (250 mg).  Winter squash,  cup (250 mg).  Yogurt, plain or fruited, 6 oz (260-435 mg).  Zucchini,  cup (220 mg).  Moderate in potassium The following foods and beverages have 50-200 mg of potassium per  serving:  Apple, 1 each (150 mg).  Apple juice,  cup (150 mg).  Applesauce,  cup (90 mg).  Apricot nectar,  cup (140 mg).  Asparagus, small spears,  cup or 6 spears (155 mg).  Bagel, cinnamon raisin, 1 each (130 mg).  Bagel, egg or plain, 4 in., 1 each (70 mg).  Beans, green,  cup (90 mg).  Beans, yellow,  cup (190 mg).  Beer, regular, 12 oz (100 mg).  Beets, canned,  cup (125 mg).  Blackberries,  cup (115 mg).  Blueberries,  cup (60 mg).  Bread, whole wheat, 1 slice (70 mg).  Broccoli, raw,  cup (145 mg).  Cabbage,  cup (150 mg).  Carrots, cooked or raw,  cup (180 mg).  Cauliflower, raw,  cup (150 mg).  Celery, raw,  cup (155 mg).  Cereal, bran flakes, cup (120-150 mg).  Cheese, cottage,  cup (110 mg).  Cherries, 10 each (150 mg).  Chocolate, 1 oz bar (165 mg).  Coffee, brewed 6 oz (90 mg).  Corn,  cup or 1 ear (195 mg).  Cucumbers,  cup (80 mg).  Egg, large, 1 each (60 mg).  Eggplant,  cup (60 mg).  Endive, raw, cup (80 mg).  English muffin, 1 each (65 mg).  Fish, orange roughy, 3 oz (150 mg).  Frankfurter, beef or pork, 1 each (75 mg).  Fruit cocktail,  cup (115 mg).  Grape juice,  cup (170 mg).  Grapefruit,  fruit (175 mg).  Grapes,  cup (155 mg).  Greens: kale, turnip, collard,  cup (110-150 mg).  Ice cream or frozen yogurt, chocolate,  cup (175  mg).  Ice cream or frozen yogurt, vanilla,  cup (120-150 mg).  Lemons, limes, 1 each (80 mg).  Lettuce, all types, 1 cup (100 mg).  Mixed vegetables,  cup (150 mg).  Mushrooms, raw,  cup (110 mg).  Nuts: walnuts, pecans, or macadamia, 1 oz (125 mg).  Oatmeal,  cup (80 mg).  Okra,  cup (110 mg).  Onions, raw,  cup (120 mg).  Peach, 1 each (185 mg).  Peaches, canned,  cup (120 mg).  Pears, canned,  cup (120 mg).  Peas, green, frozen,  cup (90 mg).  Peppers, green,  cup (130 mg).  Peppers, red,  cup (160 mg).  Pineapple juice,   cup (165 mg).  Pineapple, fresh or canned,  cup (100 mg).  Plums, 1 each (105 mg).  Pudding, vanilla,  cup (150 mg).  Raspberries,  cup (90 mg).  Rhubarb,  cup (115 mg).  Rice, wild,  cup (80 mg).  Shrimp, 3 oz (155 mg).  Spinach, raw, 1 cup (170 mg).  Strawberries,  cup (125 mg).  Summer squash  cup (175-200 mg).  Swiss chard, raw, 1 cup (135 mg).  Tangerines, 1 each (140 mg).  Tea, brewed, 6 oz (65 mg).  Turnips,  cup (140 mg).  Watermelon,  cup (85 mg).  Wine, red, table, 5 oz (180 mg).  Wine, white, table, 5 oz (100 mg).  Low in potassium The following foods and beverages have less than 50 mg of potassium per serving.  Bread, white, 1 slice (30 mg).  Carbonated beverages, 12 oz (less than 5 mg).  Cheese, 1 oz (20-30 mg).  Cranberries,  cup (45 mg).  Cranberry juice cocktail,  cup (20 mg).  Fats and oils, 1 Tbsp (less than 5 mg).  Hummus, 1 Tbsp (32 mg).  Nectar: papaya, mango, or pear,  cup (35 mg).  Rice, white or brown,  cup (50 mg).  Spaghetti or macaroni,  cup cooked (30 mg).  Tortilla, flour or corn, 1 each (50 mg).  Waffle, 4 in., 1 each (50 mg).  Water chestnuts,  cup (40 mg).  This information is not intended to replace advice given to you by your health care provider. Make sure you discuss any questions you have with your health care provider. Document Released: 08/13/2004 Document Revised: 06/07/2015 Document Reviewed: 11/26/2012 Elsevier Interactive Patient Education  [employer].

## 2017-03-17 LAB — COMPREHENSIVE METABOLIC PANEL
ALBUMIN: 4.2 g/dL (ref 3.5–4.8)
ALT: 9 IU/L (ref 0–32)
AST: 14 IU/L (ref 0–40)
Albumin/Globulin Ratio: 1.6 (ref 1.2–2.2)
Alkaline Phosphatase: 75 IU/L (ref 39–117)
BUN / CREAT RATIO: 12 (ref 12–28)
BUN: 9 mg/dL (ref 8–27)
Bilirubin Total: 0.2 mg/dL (ref 0.0–1.2)
CALCIUM: 9.3 mg/dL (ref 8.7–10.3)
CO2: 23 mmol/L (ref 20–29)
Chloride: 107 mmol/L — ABNORMAL HIGH (ref 96–106)
Creatinine, Ser: 0.77 mg/dL (ref 0.57–1.00)
GFR, EST AFRICAN AMERICAN: 90 mL/min/{1.73_m2} (ref >59)
GFR, EST NON AFRICAN AMERICAN: 78 mL/min/{1.73_m2} (ref >59)
GLOBULIN, TOTAL: 2.7 g/dL (ref 1.5–4.5)
Glucose: 82 mg/dL (ref 65–99)
Potassium: 3.7 mmol/L (ref 3.5–5.2)
SODIUM: 148 mmol/L — AB (ref 134–144)
TOTAL PROTEIN: 6.9 g/dL (ref 6.0–8.5)

## 2017-03-18 ENCOUNTER — Encounter: Payer: Self-pay | Admitting: Radiology

## 2017-03-20 ENCOUNTER — Ambulatory Visit: Payer: Medicare Other | Admitting: Hematology and Oncology

## 2017-03-23 ENCOUNTER — Ambulatory Visit: Payer: Medicare Other | Admitting: Family Medicine

## 2017-04-14 ENCOUNTER — Other Ambulatory Visit: Payer: Self-pay | Admitting: Family Medicine

## 2017-04-14 NOTE — Telephone Encounter (Signed)
Copied from [place] [id number]. Topic: Quick Communication - Rx Refill/Question >> Apr 14, 2017  1:23 PM Robina Ade, Helene Kelp D wrote: Medication: Xarelto Has the patient contacted their pharmacy? No because she was only given a started kit. (Agent: If no, request that the patient contact the pharmacy for the refill.) Preferred Pharmacy (with phone number or street name): [facility] [place] [place] Agent: Please be advised that RX refills may take up to 3 business days. We ask that you follow-up with your pharmacy.

## 2017-04-14 NOTE — Telephone Encounter (Signed)
xarelto LOV: 03/16/17 PCP: Dr Delman Cheadle Pharmacy: [facility] [address] [place], [place]

## 2017-04-15 MED ORDER — RIVAROXABAN 20 MG PO TABS: 20.0000 mg | ORAL_TABLET | Freq: Every day | ORAL | 1 refills | Status: DC

## 2017-04-15 NOTE — Telephone Encounter (Signed)
Sent in refill

## 2017-04-22 ENCOUNTER — Telehealth: Payer: Self-pay | Admitting: Family Medicine

## 2017-04-22 MED ORDER — RIVAROXABAN 15 MG PO TABS: 15.0000 mg | ORAL_TABLET | Freq: Two times a day (BID) | ORAL | 0 refills | Status: DC

## 2017-04-22 NOTE — Telephone Encounter (Addendum)
Called pt's [facility] pharmacist to get their opinion on if pt needed to restart the 3 wks of bid xarelto loading before resuming maintanence therapy (which was what was waiting for her at [facility])- pt being treated for recurrent unprovoked, now subacute, DVT dx'd 6+ wks ago. Pt complete first mo of treatment - likely ran out ~3/26 so pt has been off of xarelto for >/= 2 wks  PHARMACIST THINKS THAT YES - WOULD BE BEST FOR HER TO RELOAD THE XARELTO BY STARTING ASAP WITH 15MG  BID X 3 WK, THEN SHE CAN TRANSITION OVER TO THE 20MG  DAILY WHICH IS THE RX THAT HER HUSBAND PICKED UP FROM [place] AT 2:30 TODAY.  PLEASE CALL HUSBAND - NEEDS TO GO BACK [facility] FOR SECOND XAR[facility]TO RX WHICH PT NEEDS TO TAKE FIRST - BID - UNTIL GONE, THEN CHANGE BACK OVER TO THE XARELTO THAT HE PICKED UP TODAY.

## 2017-04-22 NOTE — Telephone Encounter (Signed)
Copied from [place] [id number]. Topic: Quick Communication - See Telephone Encounter >> Apr 22, 2017  2:24 PM Ivar Drape wrote: CRM for notification. See Telephone encounter for: 04/22/17. Starling Manns - NP w/[facility] [phone] wanted to notify the provider that the patient has not been taking her Louanna Raw meds for two weeks now because when the patient went into the hospital, they forgot to call in for the refill.

## 2017-04-22 NOTE — Telephone Encounter (Signed)
Spoke with NP. Per NP patient husband for got to pick up RX and pt has not taken xarelto for 2 weeks. Attempted to call pt to see status and no answer. This is just an FYI as husband was informed by NP that rx was ready to be picked up at [facility] and pt should restart immediately.

## 2017-04-23 ENCOUNTER — Ambulatory Visit (INDEPENDENT_AMBULATORY_CARE_PROVIDER_SITE_OTHER): Payer: Medicare Other | Admitting: Obstetrics & Gynecology

## 2017-04-23 ENCOUNTER — Encounter: Payer: Self-pay | Admitting: Obstetrics & Gynecology

## 2017-04-23 VITALS — BP 134/86 | Wt 189.0 lb

## 2017-04-23 DIAGNOSIS — R1900 Intra-abdominal and pelvic swelling, mass and lump, unspecified site: Secondary | ICD-10-CM | POA: Diagnosis not present

## 2017-04-23 DIAGNOSIS — I2699 Other pulmonary embolism without acute cor pulmonale: Secondary | ICD-10-CM | POA: Diagnosis not present

## 2017-04-23 DIAGNOSIS — Z780 Asymptomatic menopausal state: Secondary | ICD-10-CM | POA: Diagnosis not present

## 2017-04-23 DIAGNOSIS — Z01411 Encounter for gynecological examination (general) (routine) with abnormal findings: Secondary | ICD-10-CM | POA: Diagnosis not present

## 2017-04-23 DIAGNOSIS — F0280 Dementia in other diseases classified elsewhere without behavioral disturbance: Secondary | ICD-10-CM

## 2017-04-23 DIAGNOSIS — G300 Alzheimer's disease with early onset: Secondary | ICD-10-CM

## 2017-04-23 NOTE — Telephone Encounter (Signed)
Called pt husband. He is aware to pick up new RX and start that one first as per [place] instructions. Also advised that if he has any questions he can ask the pharmacist to go over the medication with him.

## 2017-04-23 NOTE — Progress Notes (Signed)
Tammy [name] 13-Feb-1945 [id number]   History:    72 y.o. G3P3L3 Married  RP:  Established patient presenting for annual gyn exam   HPI: Early onset Alzheimer, history provided by patient's husband present during the exam.  Menopause, well without HRT.  No PMB.  No pelvic pain.  Normal vaginal secretions.  Abstinent.  Urine/BMs wnl.  Breasts wnl.  H/O PE in 2018 on Xeralto.  Patient had a CT Scan with an incidental finding of a Rt Ovarian mass last year.  A Pelvic US done on 03/11/2016 showed a right ovarian mass measuring 7.0 x 5.9 x 4.0 cm solid cystic with diffuse homogeneous low level echoes with a lobulated echogenic area seen within the mass was noted. Arterial blood flow was seen to the wall of the mass.  No fluid in the cul-de-sac.  Patient seen by [facility] Dr Denman George whose impression was that the mass was probably benign, and given the Alzheimer and the recent PE, recommended no surgery and no repeat radiology assessment unless the patient became symptomatic.   Past medical history,surgical history, family history and social history were all reviewed and documented in the EPIC chart.  Gynecologic History No LMP recorded. Patient is postmenopausal. Contraception: post menopausal status Last Pap: 02/2016. Results were: Negative Mammogram:  No further screening Bone Density: No further screening Colonoscopy:  No further screening  Obstetric History OB History  Gravida Para Term Preterm AB Living  3       0 3  SAB TAB Ectopic Multiple Live Births      0        # Outcome Date GA Lbr Len/2nd Weight Sex Delivery Anes PTL Lv  3 Gravida           2 Gravida           1 Gravida              ROS: A ROS was performed and pertinent positives and negatives are included in the history.  GENERAL: No fevers or chills. HEENT: No change in vision, no earache, sore throat or sinus congestion. NECK: No pain or stiffness. CARDIOVASCULAR: No chest pain or pressure. No palpitations. PULMONARY: No  shortness of breath, cough or wheeze. GASTROINTESTINAL: No abdominal pain, nausea, vomiting or diarrhea, melena or bright red blood per rectum. GENITOURINARY: No urinary frequency, urgency, hesitancy or dysuria. MUSCULOSKELETAL: No joint or muscle pain, no back pain, no recent trauma. DERMATOLOGIC: No rash, no itching, no lesions. ENDOCRINE: No polyuria, polydipsia, no heat or cold intolerance. No recent change in weight. HEMATOLOGICAL: No anemia or easy bruising or bleeding. NEUROLOGIC: No headache, seizures, numbness, tingling or weakness. PSYCHIATRIC: No depression, no loss of interest in normal activity or change in sleep pattern.     Exam:   BP 134/86   Wt 189 lb (85.7 kg)   BMI 30.51 kg/m   Body mass index is 30.51 kg/m.  General appearance : Well developed well nourished female. No acute distress HEENT: Eyes: no retinal hemorrhage or exudates,  Neck supple, trachea midline, no carotid bruits, no thyroidmegaly Lungs: Clear to auscultation, no rhonchi or wheezes, or rib retractions  Heart: Regular rate and rhythm, no murmurs or gallops Breast:Examined in sitting and supine position were symmetrical in appearance, no palpable masses or tenderness,  no skin retraction, no nipple inversion, no nipple discharge, no skin discoloration, no axillary or supraclavicular lymphadenopathy Abdomen: no palpable masses or tenderness, no rebound or guarding Extremities: no edema or skin discoloration  or tenderness  Pelvic: Vulva: Normal             Vagina: No gross lesions or discharge  Cervix: No gross lesions or discharge  Uterus  AV, normal size, shape and consistency, non-tender and mobile  Adnexa  Left Without masses or tenderness.  Right fullness, soft, NT.  Anus: Normal   Assessment/Plan:  72 y.o. female for annual exam   1. Encounter for gynecological examination with abnormal finding Gynecologic exam in menopause, appears stable since last year.  Breast exam normal.  Health labs with  family physician.  2. Menopause present Well on no hormone replacement therapy.  No postmenopausal bleeding.  3. Pelvic mass in female Probably benign right ovarian mass detected by CT scan and ultrasound last year.  Patient remains asymptomatic.  We will continue to observe with pelvic exam yearly unless patient becomes symptomatic as per Dr. Serita Grit recommendations.  4. Other acute pulmonary embolism without acute cor pulmonale (HCC) On Xarelto.  5. Early onset Alzheimer's dementia without behavioral disturbance   Princess Bruins MD, 12:08 PM 04/23/2017

## 2017-04-23 NOTE — Patient Instructions (Signed)
1. Encounter for gynecological examination with abnormal finding Gynecologic exam in menopause, appears stable since last year.  Breast exam normal.  Health labs with family physician.  2. Menopause present Well on no hormone replacement therapy.  No postmenopausal bleeding.  3. Pelvic mass in female Probably benign right ovarian mass detected by CT scan and ultrasound last year.  Patient remains asymptomatic.  We will continue to observe with pelvic exam yearly unless patient becomes symptomatic as per Dr. Serita Grit recommendations.  4. Other acute pulmonary embolism without acute cor pulmonale (HCC) On Xarelto.  5. Early onset Alzheimer's dementia without behavioral disturbance  [name], it was a pleasure meeting you and your husband today!

## 2017-04-30 ENCOUNTER — Other Ambulatory Visit: Payer: Self-pay

## 2017-04-30 ENCOUNTER — Encounter: Payer: Self-pay | Admitting: Family Medicine

## 2017-04-30 ENCOUNTER — Ambulatory Visit (INDEPENDENT_AMBULATORY_CARE_PROVIDER_SITE_OTHER): Payer: Medicare Other | Admitting: Family Medicine

## 2017-04-30 VITALS — BP 124/80 | HR 65 | Temp 98.5°F | Resp 18 | Ht 66.0 in | Wt 151.1 lb

## 2017-04-30 DIAGNOSIS — G478 Other sleep disorders: Secondary | ICD-10-CM

## 2017-04-30 DIAGNOSIS — D649 Anemia, unspecified: Secondary | ICD-10-CM

## 2017-04-30 DIAGNOSIS — E559 Vitamin D deficiency, unspecified: Secondary | ICD-10-CM

## 2017-04-30 DIAGNOSIS — R7303 Prediabetes: Secondary | ICD-10-CM

## 2017-04-30 DIAGNOSIS — G309 Alzheimer's disease, unspecified: Secondary | ICD-10-CM | POA: Diagnosis not present

## 2017-04-30 DIAGNOSIS — I10 Essential (primary) hypertension: Secondary | ICD-10-CM

## 2017-04-30 DIAGNOSIS — F05 Delirium due to known physiological condition: Secondary | ICD-10-CM

## 2017-04-30 DIAGNOSIS — I2699 Other pulmonary embolism without acute cor pulmonale: Secondary | ICD-10-CM | POA: Diagnosis not present

## 2017-04-30 DIAGNOSIS — Z0000 Encounter for general adult medical examination without abnormal findings: Secondary | ICD-10-CM

## 2017-04-30 DIAGNOSIS — F0280 Dementia in other diseases classified elsewhere without behavioral disturbance: Secondary | ICD-10-CM

## 2017-04-30 LAB — POC MICROSCOPIC URINALYSIS (UMFC): Mucus: ABSENT

## 2017-04-30 LAB — POCT URINALYSIS DIP (MANUAL ENTRY)
BILIRUBIN UA: NEGATIVE
BILIRUBIN UA: NEGATIVE mg/dL
Glucose, UA: NEGATIVE mg/dL
Nitrite, UA: NEGATIVE
PH UA: 5.5 (ref 5.0–8.0)
Protein Ur, POC: NEGATIVE mg/dL
SPEC GRAV UA: 1.025 (ref 1.010–1.025)
Urobilinogen, UA: 0.2 E.U./dL

## 2017-04-30 MED ORDER — MELATONIN 5 MG PO TABS: 1.0000 | ORAL_TABLET | Freq: Every day | ORAL | 0 refills | Status: DC

## 2017-04-30 MED ORDER — MIRTAZAPINE 45 MG PO TABS: 45.0000 mg | ORAL_TABLET | Freq: Every day | ORAL | 1 refills | Status: DC

## 2017-04-30 NOTE — Progress Notes (Deleted)
Subjective:    Tammy [name] is a 72 y.o. female who presents for Medicare Annual/Subsequent preventive examination.  Preventive Screening-Counseling & Management  Tobacco Social History   Tobacco Use  Smoking Status Never Smoker  Smokeless Tobacco Never Used     Problems Prior to Visit 1.   Current Problems (verified) Patient Active Problem List   Diagnosis Date Noted  . Acute pulmonary embolism ([place]) 03/07/2017  . Acute DVT (deep venous thrombosis) ([place]) 03/07/2017  . Normocytic anemia 03/07/2017  . History of deep vein thrombosis (DVT) of lower extremity 03/06/2017  . History of pulmonary embolism 03/06/2017  . Unintentional weight loss of more than 5% body weight within 1 month 02/23/2017  . Essential hypertension 12/31/2016  . Pelvic mass in female 03/11/2016  . Ovarian mass 02/24/2016  . Vitamin D deficiency 01/02/2015  . Prediabetes 01/02/2015  . Memory disturbance 12/28/2014  . Alzheimer's disease 12/28/2014    Medications Prior to Visit Current Outpatient Medications on File Prior to Visit  Medication Sig Dispense Refill  . donepezil (ARICEPT) 10 MG tablet Take 1 tablet (10 mg total) by mouth at bedtime. 30 tablet 11  . feeding supplement, ENSURE ENLIVE, (ENSURE ENLIVE) LIQD Take 237 mLs by mouth 2 (two) times daily between meals. 237 mL 12  . mirtazapine (REMERON) 30 MG tablet Take 1 tablet (30 mg total) by mouth at bedtime. 30 tablet 2  . Rivaroxaban (XARELTO) 15 MG TABS tablet Take 1 tablet (15 mg total) by mouth 2 (two) times daily with a meal. 42 tablet 0  . Rivaroxaban 15 & 20 MG TBPK Take as directed on package: Start with one 15mg  tablet by mouth twice a day with food. On Day 22, switch to one 20mg  tablet once a day with food.  PHARMACY: Please discard the first tablet from the pack which she has received in the hospital 03/08/2017 am. 51 each 0  . senna-docusate (SENOKOT-S) 8.6-50 MG tablet Take 4 tablets by mouth at bedtime. 360 tablet 1  . tamsulosin  (FLOMAX) 0.4 MG CAPS capsule Take 0.4 mg by mouth.    . triamterene-hydrochlorothiazide (MAXZIDE-25) 37.5-25 MG tablet Take 1 tablet by mouth daily. 90 tablet 0  . rivaroxaban (XARELTO) 20 MG TABS tablet Take 1 tablet (20 mg total) by mouth daily with supper. (Patient not taking: Reported on 04/23/2017) 90 tablet 1   No current facility-administered medications on file prior to visit.     Current Medications (verified) Current Outpatient Medications  Medication Sig Dispense Refill  . donepezil (ARICEPT) 10 MG tablet Take 1 tablet (10 mg total) by mouth at bedtime. 30 tablet 11  . feeding supplement, ENSURE ENLIVE, (ENSURE ENLIVE) LIQD Take 237 mLs by mouth 2 (two) times daily between meals. 237 mL 12  . mirtazapine (REMERON) 30 MG tablet Take 1 tablet (30 mg total) by mouth at bedtime. 30 tablet 2  . Rivaroxaban (XARELTO) 15 MG TABS tablet Take 1 tablet (15 mg total) by mouth 2 (two) times daily with a meal. 42 tablet 0  . Rivaroxaban 15 & 20 MG TBPK Take as directed on package: Start with one 15mg  tablet by mouth twice a day with food. On Day 22, switch to one 20mg  tablet once a day with food.  PHARMACY: Please discard the first tablet from the pack which she has received in the hospital 03/08/2017 am. 51 each 0  . senna-docusate (SENOKOT-S) 8.6-50 MG tablet Take 4 tablets by mouth at bedtime. 360 tablet 1  . tamsulosin (FLOMAX) 0.4  MG CAPS capsule Take 0.4 mg by mouth.    . triamterene-hydrochlorothiazide (MAXZIDE-25) 37.5-25 MG tablet Take 1 tablet by mouth daily. 90 tablet 0  . rivaroxaban (XARELTO) 20 MG TABS tablet Take 1 tablet (20 mg total) by mouth daily with supper. (Patient not taking: Reported on 04/23/2017) 90 tablet 1   No current facility-administered medications for this visit.      Allergies (verified) Sulfa antibiotics   PAST HISTORY  Family History Family History  Problem Relation Age of Onset  . Heart disease Brother   . Heart attack Mother   . Dementia Mother      Social History Social History   Tobacco Use  . Smoking status: Never Smoker  . Smokeless tobacco: Never Used  Substance Use Topics  . Alcohol use: No     Are there smokers in your home (other than you)? {yes/no:20286}  Risk Factors Current exercise habits: {exercise:19826}  Dietary issues discussed: ***   Cardiac risk factors: {risk factors:510}.  Depression Screen (Note: if answer to either of the following is "Yes", a more complete depression screening is indicated)   Over the past two weeks, have you felt down, depressed or hopeless? {yes/no:20286}  Over the past two weeks, have you felt little interest or pleasure in doing things? {yes/no:20286}  Have you lost interest or pleasure in daily life? {yes/no:20286}  Do you often feel hopeless? {yes/no:20286}  Do you cry easily over simple problems? {yes/no:20286}  Activities of Daily Living In your present state of health, do you have any difficulty performing the following activities?:  Driving? {yes/no:20286} Managing money?  {Responses; yes/no (default no):140031::"No"} Feeding yourself? {yes/no:20286} Getting from bed to chair? {yes/no:20286}{exam, Complete:18323} Climbing a flight of stairs? {yes/no:20286} Preparing food and eating?: {yes/no (default no):140031::"No"} Bathing or showering? {Responses; yes/no (default no):140031::"No"} Getting dressed: {yes/no (default no):140031::"No"} Getting to the toilet? {Responses; yes/no (default no):140031::"No"} Using the toilet:{yes/no (default no):140031::"No"} Moving around from place to place: {yes/no (default no):140031::"No"} In the past year have you fallen or had a near fall?:{yes/no (default no):140031::"No"}   Are you sexually active?  {yes/no:20286}  Do you have more than one partner?  {Responses; yes/no (default no):140031::"No"}  Hearing Difficulties: {yes/no (default no):140031::"No"} Do you often ask people to speak up or repeat themselves? {Responses;  yes/no (default no):140031::"No"} Do you experience ringing or noises in your ears? {Responses; yes/no (default no):140031::"No"} Do you have difficulty understanding soft or whispered voices? {Responses; yes/no (default no):140031::"No"}   Do you feel that you have a problem with memory? {yes/no:20286}  Do you often misplace items? {yes/no:20286}  Do you feel safe at home?  {yes/no:20286}  Cognitive Testing  Alert? {yes/no:20286}  Normal Appearance?{yes/no:20286}  Oriented to person? {yes/no:20286}  Place? {yes/no:20286}   Time? {yes/no:20286}  Recall of three objects?  {yes/no:20286}  Can perform simple calculations? {yes/no:20286}  Displays appropriate judgment?{yes/no:20286}  Can read the correct time from a watch face?{yes/no:20286}   Advanced Directives have been discussed with the patient? {yes/no:20286}  List the Names of Other Physician/Practitioners you currently use: 1.    Indicate any recent Medical Services you may have received from other than [facility] providers in the past year (date may be approximate).  Immunization History  Administered Date(s) Administered  . Influenza,inj,Quad PF,6+ Mos 12/19/2014, 02/22/2016  . Pneumococcal Conjugate-13 08/04/2016    Screening Tests Health Maintenance  Topic Date Due  . TETANUS/TDAP  08/13/1965  . MAMMOGRAM  03/05/2018 (Originally 08/13/1996)  . DEXA SCAN  03/05/2018 (Originally 08/14/2011)  . COLONOSCOPY  03/05/2018 (Originally 08/13/1996)  .  PNA vac Low Risk Adult (2 of 2 - PPSV23) 08/04/2017  . INFLUENZA VACCINE  08/13/2017  . Hepatitis C Screening  Completed    All answers were reviewed with the patient and necessary referrals were made:  [name], MD   04/30/2017   History reviewed: {history reviewed:20406::"allergies","current medications","past family history","past medical history","past social history","past surgical history","problem list"}  Review of Systems {ros; complete:30496}    Objective:     Vision by  Snellen chart: right eye:{vision:19455::"20/20"}, left eye:{vision:19455::"20/20"}  Body mass index is 24.39 kg/m. Pulse 65   Temp 98.5 F (36.9 C) (Oral)   Resp 18   Ht 5\' 6"  (1.676 m)   Wt 151 lb 1.6 oz (68.5 kg)   SpO2 100%   BMI 24.39 kg/m   {Exam, female:18323}     Assessment:     ***     Plan:     During the course of the visit the patient was educated and counseled about appropriate screening and preventive services including:    {plan:19836}  Diet review for nutrition referral? Yes ____  Not Indicated ____   Patient Instructions (the written plan) was given to the patient.  Medicare Attestation I have personally reviewed: The patient's medical and social history Their use of alcohol, tobacco or illicit drugs Their current medications and supplements The patient's functional ability including ADLs,fall risks, home safety risks, cognitive, and hearing and visual impairment Diet and physical activities Evidence for depression or mood disorders  The patient's weight, height, BMI, and visual acuity have been recorded in the chart.  I have made referrals, counseling, and provided education to the patient based on review of the above and I have provided the patient with a written personalized care plan for preventive services.     [name], MD   04/30/2017

## 2017-04-30 NOTE — Progress Notes (Addendum)
[name], MD  Physician  Family Medicine  Progress Notes  Sign at close encounter  Encounter Date:  04/30/2017          Sign at close encounter           [] Hide copied text  [] Hover for details   Subjective:    Tammy [name] is a 72 y.o. female who presents for Medicare Annual/Subsequent preventive examination. Tammy [name] is a 72 y.o. Female who presents to the clinic today for her annual exam and wellness check. History was provided by husband due to patient's dementia. Preventive Screening-Counseling & Management   Tobacco Social History      Tobacco Use  Smoking Status Never Smoker  Smokeless Tobacco Never Used     Problems Prior to Visit 1. Early advanced Alzheimer's dementia/Sun-downing - Her husband notes that she has not been sleeping well at night and has wandered some at night. The pt notes that she takes naps during the day.  On Aricept.  2.  DVT/PE - The pt and her husband note that she tolerates her Xarelto well, and she denies any leg swelling. Initial was Rt lower ext DVT and B PE 02/2016 but stopped Xarelto 09/2016 after hematology eval - thought was provoked by long car-trip for vacation. However, acute Left DVT in left posterior tibial vein and peroneal vein and small volume Rt-sided PE recurred w/ pt with minimal sxs other than some bilateral but Lt sig > Rt pedal edema almost exactly 1 yr later 03/07/17. No known precipitated event. Now on Xarelto long-term/lifelong as long as not develops contraindications. However, pt was recommended to see hematology (Dr. Greer Pickerel) o/p as well as have screening colonoscopy, mammogram, and consider repeat CT abd/pelvix depending on what family wants as far as aggressive vs conservative mngmnt.  3.  Rt ovarian mass - followed by at [facility] last seen 04/23/17 by Dr. Dellis Filbert- 1 wk ago who on clinical assessment suspect that mass was stable and noted it was reassuringly soft and non-tender. Incidentally  found on Ct scan 02/2016 then had pelvic US for better characterization. Also saw gyn-onc Dr. Everitt Amber for further eval at that time who suspected mass was benign and rec no intervention or repeat radiology assessment unless pt became symptomatic due to comorbidities of early Alzheimers and concurrently diagnosed DVT/PE. So plans to cont to see gyn annually for pelvic exams but sooner if symptomatic (sig constipation, Rt lower ext edema, weight loss, anorexia (latter two could also be from Alzheimer's progression) PAP 02/2017 negative  4. Normocytic Anemia of unknown etiology - poss due to chronic disease. During recent hosp for DVT/PE 03/07/17, had lab w/u for anemia w/ nml folate, vit B12, iron, ferritin, retics, coags, tsh, fecal occult blood all normal. Was arranged to f/u w/ hematology o/p Dr. Grace Isaac but no showed 03/20/17 appt.  5. Severe weight loss - Could be secondary to progressive dementia but could be malignancy considering recurrent DVTs as well - is not UTD on mammogram, colonscopy, consider repeat pelvic/abd CT but due to severity of dementia, husband not sure he wants to put Tammy [name] though the discomfort of testing and then procedures and treatment if anything is found considering she will have difficulty understanding why she is feeling hurt or ill, etc.  Never f/u w/ heme/onc Dr. Grace Isaac 03/20/17 as was sched after last hosp 02/2017 - no showed visit.   She notes that her breathing and appetite have been good. She  denies any chest or stomach pain. The pt denies any constipation and reports that she has been able to use the bathroom well.  The patient's husband notes that the pt went to the OBGYN Dr. Dellis Filbert a few days ago - husband reports they did a routine mammogram and breast exam which yielded no noteworthy findings. However, chart review shows visit on 4/11 was for pelvic mass, clinical breast exam was done - no mammogram, pelvic exam was done but no further paps indicated.     Current Problems (verified)     Patient Active Problem List   Diagnosis Date Noted  . Acute pulmonary embolism ([place]) 03/07/2017  . Acute DVT (deep venous thrombosis) ([place]) 03/07/2017  . Normocytic anemia 03/07/2017  . History of deep vein thrombosis (DVT) of lower extremity 03/06/2017  . History of pulmonary embolism 03/06/2017  . Unintentional weight loss of more than 5% body weight within 1 month 02/23/2017  . Essential hypertension 12/31/2016  . Pelvic mass in female 03/11/2016  . Ovarian mass 02/24/2016  . Vitamin D deficiency 01/02/2015  . Prediabetes 01/02/2015  . Memory disturbance 12/28/2014  . Alzheimer's disease 12/28/2014    Medications Prior to Visit       Current Outpatient Medications on File Prior to Visit  Medication Sig Dispense Refill  . donepezil (ARICEPT) 10 MG tablet Take 1 tablet (10 mg total) by mouth at bedtime. 30 tablet 11  . feeding supplement, ENSURE ENLIVE, (ENSURE ENLIVE) LIQD Take 237 mLs by mouth 2 (two) times daily between meals. 237 mL 12  . mirtazapine (REMERON) 30 MG tablet Take 1 tablet (30 mg total) by mouth at bedtime. 30 tablet 2  . Rivaroxaban (XARELTO) 15 MG TABS tablet Take 1 tablet (15 mg total) by mouth 2 (two) times daily with a meal. 42 tablet 0  . Rivaroxaban 15 & 20 MG TBPK Take as directed on package: Start with one 15mg  tablet by mouth twice a day with food. On Day 22, switch to one 20mg  tablet once a day with food.  PHARMACY: Please discard the first tablet from the pack which she has received in the hospital 03/08/2017 am. 51 each 0  . senna-docusate (SENOKOT-S) 8.6-50 MG tablet Take 4 tablets by mouth at bedtime. 360 tablet 1  . tamsulosin (FLOMAX) 0.4 MG CAPS capsule Take 0.4 mg by mouth.    . triamterene-hydrochlorothiazide (MAXZIDE-25) 37.5-25 MG tablet Take 1 tablet by mouth daily. 90 tablet 0  . rivaroxaban (XARELTO) 20 MG TABS tablet Take 1 tablet (20 mg total) by mouth daily with supper. (Patient not taking:  Reported on 04/23/2017) 90 tablet 1   No current facility-administered medications on file prior to visit.     Current Medications (verified)       Current Outpatient Medications  Medication Sig Dispense Refill  . donepezil (ARICEPT) 10 MG tablet Take 1 tablet (10 mg total) by mouth at bedtime. 30 tablet 11  . feeding supplement, ENSURE ENLIVE, (ENSURE ENLIVE) LIQD Take 237 mLs by mouth 2 (two) times daily between meals. 237 mL 12  . mirtazapine (REMERON) 30 MG tablet Take 1 tablet (30 mg total) by mouth at bedtime. 30 tablet 2  . Rivaroxaban (XARELTO) 15 MG TABS tablet Take 1 tablet (15 mg total) by mouth 2 (two) times daily with a meal. 42 tablet 0  . Rivaroxaban 15 & 20 MG TBPK Take as directed on package: Start with one 15mg  tablet by mouth twice a day with food. On Day 22, switch to  one 20mg  tablet once a day with food.  PHARMACY: Please discard the first tablet from the pack which she has received in the hospital 03/08/2017 am. 51 each 0  . senna-docusate (SENOKOT-S) 8.6-50 MG tablet Take 4 tablets by mouth at bedtime. 360 tablet 1  . tamsulosin (FLOMAX) 0.4 MG CAPS capsule Take 0.4 mg by mouth.    . triamterene-hydrochlorothiazide (MAXZIDE-25) 37.5-25 MG tablet Take 1 tablet by mouth daily. 90 tablet 0  . rivaroxaban (XARELTO) 20 MG TABS tablet Take 1 tablet (20 mg total) by mouth daily with supper. (Patient not taking: Reported on 04/23/2017) 90 tablet 1   No current facility-administered medications for this visit.      Allergies (verified) Sulfa antibiotics   PAST HISTORY  Family History Family History  Problem Relation Age of Onset  . Heart disease Brother   . Heart attack Mother   . Dementia Mother     Social History Social History       Tobacco Use  . Smoking status: Never Smoker  . Smokeless tobacco: Never Used  Substance Use Topics  . Alcohol use: No     Are there smokers in your home (other than you)? No  Risk Factors Current exercise  habits: The patient does not participate in regular exercise at present.  Dietary issues discussed: - husband tries to feed her healthy food but she just refuses to eat it then snacks on junk.   Cardiac risk factors: advanced age (older than 67 for men, 27 for women) and sedentary lifestyle.  Depression Screen (Note: if answer to either of the following is "Yes", a more complete depression screening is indicated)  Unable to accurately assess due to moderate early Alzheimers though pt answers no to the questions - denies any depression or anhedonia Depression screen [employer] 2/9 03/16/2017 03/05/2017 02/23/2017 02/17/2017 02/02/2017  Decreased Interest 0 0 0 0 0  Down, Depressed, Hopeless 0 0 0 0 0  PHQ - 2 Score 0 0 0 0 0    Activities of Daily Living In your present state of health, do you have any difficulty performing the following activities?:  Driving? Yes Managing money?  Yes Feeding yourself? No Getting from bed to chair? No Climbing a flight of stairs? No Preparing food and eating?: Yes Bathing or showering? Yes Getting dressed: Yes Getting to the toilet? Yes Using the toilet:No Moving around from place to place: No  Fall Risk  04/30/2017 03/16/2017 03/05/2017 02/23/2017 02/17/2017  Falls in the past year? No No No No No   In the past year have you fallen or had a near fall?:No             Are you sexually active?  No             Do you have more than one partner?  No  Hearing Difficulties: No Do you often ask people to speak up or repeat themselves? No Do you experience ringing or noises in your ears? No Do you have difficulty understanding soft or whispered voices? Yes              Do you feel that you have a problem with memory? Yes             Do you often misplace items? Yes             Do you feel safe at home?  Yes  Cognitive Testing  Alert? Yes  Normal Appearance?Yes             Oriented to person? Yes  Place? No              Time? No             Recall  of three objects?  No             Can perform simple calculations? No             Displays appropriate judgment?No             Can read the correct time from a watch face?No              Advanced Directives have been discussed with the patient? Yes  List the Names of Other Physician/Practitioners you currently use: 1.  Dr. Princess Bruins, gynecology 04/23/17  Indicate any recent Medical Services you may have received from other than [facility] providers in the past year (date may be approximate).  Immunization History  Administered Date(s) Administered  . Influenza,inj,Quad PF,6+ Mos 12/19/2014, 02/22/2016  . Pneumococcal Conjugate-13 08/04/2016    Screening Tests     Health Maintenance  Topic Date Due  . TETANUS/TDAP  08/13/1965  . MAMMOGRAM  03/05/2018 (Originally 08/13/1996)  . DEXA SCAN  03/05/2018 (Originally 08/14/2011)  . COLONOSCOPY  03/05/2018 (Originally 08/13/1996)  . PNA vac Low Risk Adult (2 of 2 - PPSV23) 08/04/2017  . INFLUENZA VACCINE  08/13/2017  . Hepatitis C Screening  Completed    All answers were reviewed with the patient and necessary referrals were made:  [name], MD                                04/30/2017   History reviewed: allergies, current medications, past family history, past medical history, past social history, past surgical history and problem list  Review of Systems Review of systems not obtained due to patient factors.    Objective:  Vision Screening Comments: Pt has difficultly understanding what she needs to read.  Body mass index is 24.39 kg/m. Pulse 65   Temp 98.5 F (36.9 C) (Oral)   Resp 18   Ht 5\' 6"  (1.676 m)   Wt 151 lb 1.6 oz (68.5 kg)   SpO2 100%   BMI 24.39 kg/m   General appearance: alert, cooperative, appears stated age, no distress and slowed mentation Head: Normocephalic, without obvious abnormality, atraumatic Eyes: conjunctivae/corneas clear. PERRL, EOM's intact. Ears: normal TM's and external ear  canals both ears Nose: Nares normal. Septum midline. Mucosa normal. No drainage or sinus tenderness. Throat: normal findings: lips normal without lesions, palate normal, tongue midline and normal and soft palate, uvula, and tonsils normal Neck: no adenopathy, supple, symmetrical, trachea midline and thyroid not enlarged, symmetric, no tenderness/mass/nodules Lungs: clear to auscultation bilaterally Heart: regular rate and rhythm, S1, S2 normal, no murmur, click, rub or gallop Abdomen: soft, non-tender; bowel sounds normal; no masses,  no organomegaly Extremities: extremities normal, atraumatic, no cyanosis or edema Pulses: 2+ and symmetric Skin: Skin color, texture, turgor normal. No rashes or lesions Lymph nodes: Cervical, supraclavicular nodes normal. Neurologic: Motor: grossly normal Gait: Normal   Assessment:    1. Medicare annual wellness visit, subsequent   2. Essential hypertension - Okay to refill Triamterene HCTZ x6 months when requested.   3. Other acute pulmonary embolism without acute cor pulmonale ([place])   4. Alzheimer's dementia  without behavioral disturbance, unspecified timing of dementia onset   5. Normocytic anemia   6. Vitamin D deficiency   7. Prediabetes -  Lab Results  Component Value Date   HGBA1C 6.1 (H) 02/02/2017   HGBA1C 5.8 (H) 08/04/2016   HGBA1C 5.6 03/27/2016    8. Sundowning - start trial of melatonin 5 qhs and increase mirtazapine from 30 to 45.  Try to spend time outdoors and/or active during the day so Tammy [name] doesn't have as many chances to nap - plant flowers, weed the garden, handweed the lawn, etc  9. Awakens from sleep at night       Plan:   During the course of the visit the patient was educated and counseled about appropriate screening and preventive services including:   Due to progression of early Alzheimer's it seems appropriate no longer to screen for conditions/cancer that would require surgical or difficult therapeutic treatment  such as cancer and osteoporosis. Last Pap: 02/2016. Results were: Negative Mammogram:  No further screening -  Mammogram had been MISTAKENLY listed in HM as completed on 4/15 - has not been done at all recently and not indicated - this was mistaken understanding on husband's report. . Donzetta Kohut Density: No further screening Colonoscopy:  No further screening  Diet review for nutrition referral? Yes ____  Not Indicated __x__ - pt will not understand/remember instruction   Patient Instructions (the written plan) was given to the patient.  Medicare Attestation I have personally reviewed: The patient's medical and social history Their use of alcohol, tobacco or illicit drugs Their current medications and supplements The patient's functional ability including ADLs,fall risks, home safety risks, cognitive, and hearing and visual impairment Diet and physical activities Evidence for depression or mood disorders  The patient's weight, height, BMI, and visual acuity have been recorded in the chart.  I have made referrals, counseling, and provided education to the patient based on review of the above and I have provided the patient with a written personalized care plan for preventive services.     Orders Placed This Encounter  Procedures  . Urine Culture  . Basic metabolic panel    Order Specific Question:   Has the patient fasted?    Answer:   No  . CBC with Differential/Platelet  . POCT urinalysis dipstick  . POCT Microscopic Urinalysis ([facility])    Meds ordered this encounter  Medications  . mirtazapine (REMERON) 45 MG tablet    Sig: Take 1 tablet (45 mg total) by mouth at bedtime.    Dispense:  90 tablet    Refill:  1    D/c prior rx for mirtazapine 30 mg a day  . Melatonin 5 MG TABS    Sig: Take 1 tablet (5 mg total) by mouth at bedtime. About 1-2 hours before bed    Refill:  0    I personally performed the services described in this documentation, which was scribed in my  presence. The recorded information has been reviewed and considered, and addended by me as needed.   Delman Cheadle, M.D.  Primary Care at [facility] [address] [place], [place] [address] [phone] phone [phone] fax  06/30/17 10:50 PM   [name], MD                                04/30/2017

## 2017-04-30 NOTE — Patient Instructions (Addendum)
I agree that spending time outside more during the day will be a great way to help with sleep at night.   We also increased the mirtazipine to 45 mg and start a melatonin supplement (find in the pharmacy/[employer] in the vitamin aisle) 1-2 hours before bed at night.    IF you received an x-ray today, you will receive an invoice from [facility]. Please contact [facility] at [phone] with questions or concerns regarding your invoice.   IF you received labwork today, you will receive an invoice from [facility]. Please contact [facility] at [phone] with questions or concerns regarding your invoice.   Our billing staff will not be able to assist you with questions regarding bills from these companies.  You will be contacted with the lab results as soon as they are available. The fastest way to get your results is to activate your My Chart account. Instructions are located on the last page of this paperwork. If you have not heard from [place] regarding the results in 2 weeks, please contact this office.   Insomnia Insomnia is a sleep disorder that makes it difficult to fall asleep or to stay asleep. Insomnia can cause tiredness (fatigue), low energy, difficulty concentrating, mood swings, and poor performance at work or school. There are three different ways to classify insomnia:  Difficulty falling asleep.  Difficulty staying asleep.  Waking up too early in the morning.  Any type of insomnia can be long-term (chronic) or short-term (acute). Both are common. Short-term insomnia usually lasts for three months or less. Chronic insomnia occurs at least three times a week for longer than three months. What are the causes? Insomnia may be caused by another condition, situation, or substance, such as:  Anxiety.  Certain medicines.  Gastroesophageal reflux disease (GERD) or other gastrointestinal conditions.  Asthma or other breathing conditions.  Restless legs  syndrome, sleep apnea, or other sleep disorders.  Chronic pain.  Menopause. This may include hot flashes.  Stroke.  Abuse of alcohol, tobacco, or illegal drugs.  Depression.  Caffeine.  Neurological disorders, such as Alzheimer disease.  An overactive thyroid (hyperthyroidism).  The cause of insomnia may not be known. What increases the risk? Risk factors for insomnia include:  Gender. Women are more commonly affected than men.  Age. Insomnia is more common as you get older.  Stress. This may involve your professional or personal life.  Income. Insomnia is more common in people with lower income.  Lack of exercise.  Irregular work schedule or night shifts.  Traveling between different time zones.  What are the signs or symptoms? If you have insomnia, trouble falling asleep or trouble staying asleep is the main symptom. This may lead to other symptoms, such as:  Feeling fatigued.  Feeling nervous about going to sleep.  Not feeling rested in the morning.  Having trouble concentrating.  Feeling irritable, anxious, or depressed.  How is this treated? Treatment for insomnia depends on the cause. If your insomnia is caused by an underlying condition, treatment will focus on addressing the condition. Treatment may also include:  Medicines to help you sleep.  Counseling or therapy.  Lifestyle adjustments.  Follow these instructions at home:  Take medicines only as directed by your health care provider.  Keep regular sleeping and waking hours. Avoid naps.  Keep a sleep diary to help you and your health care provider figure out what could be causing your insomnia. Include: ? When you sleep. ? When you wake up during the night. ?  How well you sleep. ? How rested you feel the next day. ? Any side effects of medicines you are taking. ? What you eat and drink.  Make your bedroom a comfortable place where it is easy to fall asleep: ? Put up shades or special  blackout curtains to block light from outside. ? Use a white noise machine to block noise. ? Keep the temperature cool.  Exercise regularly as directed by your health care provider. Avoid exercising right before bedtime.  Use relaxation techniques to manage stress. Ask your health care provider to suggest some techniques that may work well for you. These may include: ? Breathing exercises. ? Routines to release muscle tension. ? Visualizing peaceful scenes.  Cut back on alcohol, caffeinated beverages, and cigarettes, especially close to bedtime. These can disrupt your sleep.  Do not overeat or eat spicy foods right before bedtime. This can lead to digestive discomfort that can make it hard for you to sleep.  Limit screen use before bedtime. This includes: ? Watching TV. ? Using your smartphone, tablet, and computer.  Stick to a routine. This can help you fall asleep faster. Try to do a quiet activity, brush your teeth, and go to bed at the same time each night.  Get out of bed if you are still awake after 15 minutes of trying to sleep. Keep the lights down, but try reading or doing a quiet activity. When you feel sleepy, go back to bed.  Make sure that you drive carefully. Avoid driving if you feel very sleepy.  Keep all follow-up appointments as directed by your health care provider. This is important. Contact a health care provider if:  You are tired throughout the day or have trouble in your daily routine due to sleepiness.  You continue to have sleep problems or your sleep problems get worse. Get help right away if:  You have serious thoughts about hurting yourself or someone else. This information is not intended to replace advice given to you by your health care provider. Make sure you discuss any questions you have with your health care provider. Document Released: 12/28/1999 Document Revised: 06/01/2015 Document Reviewed: 09/30/2013 Elsevier Interactive Patient Education   2018 [employer].    Vitamin D Deficiency Vitamin D deficiency is when your body does not have enough vitamin D. Vitamin D is important to your body for many reasons:  It helps the body to absorb two important minerals, called calcium and phosphorus.  It plays a role in bone health.  It may help to prevent some diseases, such as diabetes and multiple sclerosis.  It plays a role in muscle function, including heart function.  You can get vitamin D by:  Eating foods that naturally contain vitamin D.  Eating or drinking milk or other dairy products that have vitamin D added to them.  Taking a vitamin D supplement or a multivitamin supplement that contains vitamin D.  Being in the sun. Your body naturally makes vitamin D when your skin is exposed to sunlight. Your body changes the sunlight into a form of the vitamin that the body can use.  If vitamin D deficiency is severe, it can cause a condition in which your bones become soft. In adults, this condition is called osteomalacia. In children, this condition is called rickets. What are the causes? Vitamin D deficiency may be caused by:  Not eating enough foods that contain vitamin D.  Not getting enough sun exposure.  Having certain digestive system diseases that  make it difficult for your body to absorb vitamin D. These diseases include Crohn disease, chronic pancreatitis, and cystic fibrosis.  Having a surgery in which a part of the stomach or a part of the small intestine is removed.  Being obese.  Having chronic kidney disease or liver disease.  What increases the risk? This condition is more likely to develop in:  Older people.  People who do not spend much time outdoors.  People who live in a long-term care facility.  People who have had broken bones.  People with weak or thin bones (osteoporosis).  People who have a disease or condition that changes how the body absorbs vitamin D.  People who have dark  skin.  People who take certain medicines, such as steroid medicines or certain seizure medicines.  People who are overweight or obese.  What are the signs or symptoms? In mild cases of vitamin D deficiency, there may not be any symptoms. If the condition is severe, symptoms may include:  Bone pain.  Muscle pain.  Falling often.  Broken bones caused by a minor injury.  How is this diagnosed? This condition is usually diagnosed with a blood test. How is this treated? Treatment for this condition may depend on what caused the condition. Treatment options include:  Taking vitamin D supplements.  Taking a calcium supplement. Your health care provider will suggest what dose is best for you.  Follow these instructions at home:  Take medicines and supplements only as told by your health care provider.  Eat foods that contain vitamin D. Choices include: ? Fortified dairy products, cereals, or juices. Fortified means that vitamin D has been added to the food. Check the label on the package to be sure. ? Fatty fish, such as salmon or trout. ? Eggs. ? Oysters.  Do not use a tanning bed.  Maintain a healthy weight. Lose weight, if needed.  Keep all follow-up visits as told by your health care provider. This is important. Contact a health care provider if:  Your symptoms do not go away.  You feel like throwing up (nausea) or you throw up (vomit).  You have fewer bowel movements than usual or it is difficult for you to have a bowel movement (constipation). This information is not intended to replace advice given to you by your health care provider. Make sure you discuss any questions you have with your health care provider. Document Released: 03/24/2011 Document Revised: 06/13/2015 Document Reviewed: 05/17/2014 Elsevier Interactive Patient Education  2018 [employer].   Prediabetes Prediabetes is the condition of having a blood sugar (blood glucose) level that is higher than  it should be, but not high enough for you to be diagnosed with type 2 diabetes. Having prediabetes puts you at risk for developing type 2 diabetes (type 2 diabetes mellitus). Prediabetes may be called impaired glucose tolerance or impaired fasting glucose. Prediabetes usually does not cause symptoms. Your health care provider can diagnose this condition with blood tests. You may be tested for prediabetes if you are overweight and if you have at least one other risk factor for prediabetes. Risk factors for prediabetes include:  Having a family member with type 2 diabetes.  Being overweight or obese.  Being older than age 67.  Being of American-Indian, African-American, Hispanic/Latino, or Asian/Pacific Islander descent.  Having an inactive (sedentary) lifestyle.  Having a history of gestational diabetes or polycystic ovarian syndrome (PCOS).  Having low levels of good cholesterol (HDL-C) or high levels of blood fats (  triglycerides).  Having high blood pressure.  What is blood glucose and how is blood glucose measured?  Blood glucose refers to the amount of glucose in your bloodstream. Glucose comes from eating foods that contain sugars and starches (carbohydrates) that the body breaks down into glucose. Your blood glucose level may be measured in mg/dL (milligrams per deciliter) or mmol/L (millimoles per liter).Your blood glucose may be checked with one or more of the following blood tests:  A fasting blood glucose (FBG) test. You will not be allowed to eat (you will fast) for at least 8 hours before a blood sample is taken. ? A normal range for FBG is 70-100 mg/dl (3.9-5.6 mmol/L).  An A1c (hemoglobin A1c) blood test. This test provides information about blood glucose control over the previous 2?76months.  An oral glucose tolerance test (OGTT). This test measures your blood glucose twice: ? After fasting. This is your baseline level. ? Two hours after you drink a beverage that  contains glucose.  You may be diagnosed with prediabetes:  If your FBG is 100?125 mg/dL (5.6-6.9 mmol/L).  If your A1c level is 5.7?6.4%.  If your OGGT result is 140?199 mg/dL (7.8-11 mmol/L).  These blood tests may be repeated to confirm your diagnosis. What happens if blood glucose is too high? The pancreas produces a hormone (insulin) that helps move glucose from the bloodstream into cells. When cells in the body do not respond properly to insulin that the body makes (insulin resistance), excess glucose builds up in the blood instead of going into cells. As a result, high blood glucose (hyperglycemia) can develop, which can cause many complications. This is a symptom of prediabetes. What can happen if blood glucose stays higher than normal for a long time? Having high blood glucose for a long time is dangerous. Too much glucose in your blood can damage your nerves and blood vessels. Long-term damage can lead to complications from diabetes, which may include:  Heart disease.  Stroke.  Blindness.  Kidney disease.  Depression.  Poor circulation in the feet and legs, which could lead to surgical removal (amputation) in severe cases.  How can prediabetes be prevented from turning into type 2 diabetes?  To help prevent type 2 diabetes, take the following actions:  Be physically active. ? Do moderate-intensity physical activity for at least 30 minutes on at least 5 days of the week, or as much as told by your health care provider. This could be brisk walking, biking, or water aerobics. ? Ask your health care provider what activities are safe for you. A mix of physical activities may be best, such as walking, swimming, cycling, and strength training.  Lose weight as told by your health care provider. ? Losing 5-7% of your body weight can reverse insulin resistance. ? Your health care provider can determine how much weight loss is best for you and can help you lose weight  safely.  Follow a healthy meal plan. This includes eating lean proteins, complex carbohydrates, fresh fruits and vegetables, low-fat dairy products, and healthy fats. ? Follow instructions from your health care provider about eating or drinking restrictions. ? Make an appointment to see a diet and nutrition specialist (registered dietitian) to help you create a healthy eating plan that is right for you.  Do not smoke or use any tobacco products, such as cigarettes, chewing tobacco, and e-cigarettes. If you need help quitting, ask your health care provider.  Take over-the-counter and prescription medicines as told by your health care  provider. You may be prescribed medicines that help lower the risk of type 2 diabetes.  This information is not intended to replace advice given to you by your health care provider. Make sure you discuss any questions you have with your health care provider. Document Released: 04/23/2015 Document Revised: 06/07/2015 Document Reviewed: 02/20/2015 Elsevier Interactive Patient Education  2018 [employer].   Prediabetes Eating Plan Prediabetes-also called impaired glucose tolerance or impaired fasting glucose-is a condition that causes blood sugar (blood glucose) levels to be higher than normal. Following a healthy diet can help to keep prediabetes under control. It can also help to lower the risk of type 2 diabetes and heart disease, which are increased in people who have prediabetes. Along with regular exercise, a healthy diet:  Promotes weight loss.  Helps to control blood sugar levels.  Helps to improve the way that the body uses insulin.  What do I need to know about this eating plan?  Use the glycemic index (GI) to plan your meals. The index tells you how quickly a food will raise your blood sugar. Choose low-GI foods. These foods take a longer time to raise blood sugar.  Pay close attention to the amount of carbohydrates in the food that you eat.  Carbohydrates increase blood sugar levels.  Keep track of how many calories you take in. Eating the right amount of calories will help you to achieve a healthy weight. Losing about 7 percent of your starting weight can help to prevent type 2 diabetes.  You may want to follow a Mediterranean diet. This diet includes a lot of vegetables, lean meats or fish, whole grains, fruits, and healthy oils and fats. What foods can I eat? Grains Whole grains, such as whole-wheat or whole-grain breads, crackers, cereals, and pasta. Unsweetened oatmeal. Bulgur. Barley. Quinoa. Brown rice. Corn or whole-wheat flour tortillas or taco shells. Vegetables Lettuce. Spinach. Peas. Beets. Cauliflower. Cabbage. Broccoli. Carrots. Tomatoes. Squash. Eggplant. Herbs. Peppers. Onions. Cucumbers. Brussels sprouts. Fruits Berries. Bananas. Apples. Oranges. Grapes. Papaya. Mango. Pomegranate. Kiwi. Grapefruit. Cherries. Meats and Other Protein Sources Seafood. Lean meats, such as chicken and [place] or lean cuts of pork and beef. Tofu. Eggs. Nuts. Beans. Dairy Low-fat or fat-free dairy products, such as yogurt, cottage cheese, and cheese. Beverages Water. Tea. Coffee. Sugar-free or diet soda. Seltzer water. Milk. Milk alternatives, such as soy or almond milk. Condiments Mustard. Relish. Low-fat, low-sugar ketchup. Low-fat, low-sugar barbecue sauce. Low-fat or fat-free mayonnaise. Sweets and Desserts Sugar-free or low-fat pudding. Sugar-free or low-fat ice cream and other frozen treats. Fats and Oils Avocado. Walnuts. Olive oil. The items listed above may not be a complete list of recommended foods or beverages. Contact your dietitian for more options. What foods are not recommended? Grains Refined white flour and flour products, such as bread, pasta, snack foods, and cereals. Beverages Sweetened drinks, such as sweet iced tea and soda. Sweets and Desserts Baked goods, such as cake, cupcakes, pastries, cookies, and  cheesecake. The items listed above may not be a complete list of foods and beverages to avoid. Contact your dietitian for more information. This information is not intended to replace advice given to you by your health care provider. Make sure you discuss any questions you have with your health care provider. Document Released: 05/16/2014 Document Revised: 06/07/2015 Document Reviewed: 01/25/2014 Elsevier Interactive Patient Education  2017 [employer].

## 2017-05-01 LAB — CBC WITH DIFFERENTIAL/PLATELET
BASOS: 1 %
Basophils Absolute: 0.1 10*3/uL (ref 0.0–0.2)
EOS (ABSOLUTE): 0.1 10*3/uL (ref 0.0–0.4)
EOS: 1 %
HEMATOCRIT: 33 % — AB (ref 34.0–46.6)
Hemoglobin: 10.4 g/dL — ABNORMAL LOW (ref 11.1–15.9)
IMMATURE GRANS (ABS): 0 10*3/uL (ref 0.0–0.1)
IMMATURE GRANULOCYTES: 0 %
LYMPHS: 43 %
Lymphocytes Absolute: 2 10*3/uL (ref 0.7–3.1)
MCH: 28 pg (ref 26.6–33.0)
MCHC: 31.5 g/dL (ref 31.5–35.7)
MCV: 89 fL (ref 79–97)
MONOS ABS: 0.3 10*3/uL (ref 0.1–0.9)
Monocytes: 7 %
NEUTROS PCT: 48 %
Neutrophils Absolute: 2.2 10*3/uL (ref 1.4–7.0)
Platelets: 295 10*3/uL (ref 150–379)
RBC: 3.71 x10E6/uL — ABNORMAL LOW (ref 3.77–5.28)
RDW: 14.7 % (ref 12.3–15.4)
WBC: 4.7 10*3/uL (ref 3.4–10.8)

## 2017-05-01 LAB — BASIC METABOLIC PANEL
BUN/Creatinine Ratio: 20 (ref 12–28)
BUN: 22 mg/dL (ref 8–27)
CO2: 24 mmol/L (ref 20–29)
Calcium: 9.7 mg/dL (ref 8.7–10.3)
Chloride: 104 mmol/L (ref 96–106)
Creatinine, Ser: 1.1 mg/dL — ABNORMAL HIGH (ref 0.57–1.00)
GFR calc Af Amer: 59 mL/min/{1.73_m2} — ABNORMAL LOW (ref >59)
GFR calc non Af Amer: 51 mL/min/{1.73_m2} — ABNORMAL LOW (ref >59)
Glucose: 91 mg/dL (ref 65–99)
Potassium: 3.7 mmol/L (ref 3.5–5.2)
SODIUM: 143 mmol/L (ref 134–144)

## 2017-05-01 LAB — URINE CULTURE

## 2017-06-25 ENCOUNTER — Other Ambulatory Visit: Payer: Self-pay | Admitting: Family Medicine

## 2017-06-29 ENCOUNTER — Ambulatory Visit (INDEPENDENT_AMBULATORY_CARE_PROVIDER_SITE_OTHER): Payer: Medicare Other | Admitting: Adult Health

## 2017-06-29 ENCOUNTER — Encounter: Payer: Self-pay | Admitting: Adult Health

## 2017-06-29 VITALS — BP 109/79 | HR 83 | Ht 66.0 in | Wt 137.2 lb

## 2017-06-29 DIAGNOSIS — G309 Alzheimer's disease, unspecified: Secondary | ICD-10-CM | POA: Diagnosis not present

## 2017-06-29 DIAGNOSIS — F0280 Dementia in other diseases classified elsewhere without behavioral disturbance: Secondary | ICD-10-CM

## 2017-06-29 MED ORDER — DONEPEZIL HCL 5 MG PO TABS: 5.0000 mg | ORAL_TABLET | Freq: Every day | ORAL | 11 refills | Status: DC

## 2017-06-29 NOTE — Progress Notes (Signed)
I have read the note, and I agree with the clinical assessment and plan.  Charles K Willis   

## 2017-06-29 NOTE — Patient Instructions (Signed)
Your Plan:  Decrease Aricept to 5 mg at bedtime Monitor weight  If your symptoms worsen or you develop new symptoms please let us know.       Thank you for coming to see [place] at [facility]. I hope we have been able to provide you high quality care today.  You may receive a patient satisfaction survey over the next few weeks. We would appreciate your feedback and comments so that we may continue to improve ourselves and the health of our patients.

## 2017-06-29 NOTE — Progress Notes (Signed)
PATIENT: Tammy [name] DOB: 1945-09-12  REASON FOR VISIT: follow up HISTORY FROM: patient  HISTORY OF PRESENT ILLNESS: Today 06/29/17 Tammy [name] is a 72 year old female with a history of memory disturbance.  She returns today for follow-up.  She lives at home with her husband.  She requires assistance with all ADLs.  She does not operate a motor vehicle.  She no longer does any cooking.  Her husband manages her finances as well as her medications and appointments.  He denies any changes in her mood or behavior.  He denies any hallucinations.  Patient remains on Aricept 10 mg at bedtime.  He notes that he is having a hard time getting her to eat.  He is trying to supplement with Ensure.  Reports that she has lost a significant amount of weight.   HISTORY 12/29/16  Tammy [name] is a 72 year old female with a history of progressive memory disturbance consistent with Alzheimer's disease.  She returns today for follow-up.  At the last visit the patient was restarted on Aricept.  Husband reports that she is tolerating the medication well.  She does require assistance with all ADLs.  Her husband manages the finances and prepares all meals.  Her daughter assists her with all her hygiene needs.  The family denies any trouble sleeping.  Denies any hallucinations or agitation or aggressiveness.  Overall the patient has remained stable.  She returns today for an evaluation.   REVIEW OF SYSTEMS: Out of a complete 14 system review of symptoms, the patient complains only of the following symptoms, and all other reviewed systems are negative.  See HPI  ALLERGIES: Allergies  Allergen Reactions  . Sulfa Antibiotics Palpitations    HOME MEDICATIONS: Outpatient Medications Prior to Visit  Medication Sig Dispense Refill  . donepezil (ARICEPT) 10 MG tablet Take 1 tablet (10 mg total) by mouth at bedtime. 30 tablet 11  . feeding supplement, ENSURE ENLIVE, (ENSURE ENLIVE) LIQD Take 237 mLs by mouth 2 (two)  times daily between meals. 237 mL 12  . Melatonin 5 MG TABS Take 1 tablet (5 mg total) by mouth at bedtime. About 1-2 hours before bed  0  . mirtazapine (REMERON) 45 MG tablet Take 1 tablet (45 mg total) by mouth at bedtime. 90 tablet 1  . rivaroxaban (XARELTO) 20 MG TABS tablet Take 1 tablet (20 mg total) by mouth daily with supper. (Patient not taking: Reported on 04/23/2017) 90 tablet 1  . senna-docusate (SENOKOT-S) 8.6-50 MG tablet Take 4 tablets by mouth at bedtime. 360 tablet 1  . tamsulosin (FLOMAX) 0.4 MG CAPS capsule Take 0.4 mg by mouth.    . triamterene-hydrochlorothiazide (MAXZIDE-25) 37.5-25 MG tablet Take 1 tablet by mouth daily. 90 tablet 0   No facility-administered medications prior to visit.     PAST MEDICAL HISTORY: Past Medical History:  Diagnosis Date  . Alzheimer's disease 12/28/2014  . Dementia   . Headache disorder 12/28/2014  . Memory disturbance 12/28/2014  . Pulmonary embolus ([place]) 02/23/2016  . 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  FAMILY HISTORY: Family History  Problem Relation Age of Onset  . Heart disease Brother   . Heart attack Mother   . Dementia Mother     SOCIAL HISTORY: Social History   Socioeconomic History  . Marital status: Married    Spouse name: [name]  . Number of children: 3  . Years of education: 54  . Highest  education level: Not on file  Occupational History  . Occupation: retired  Scientific laboratory technician  . Financial resource strain: Not on file  . Food insecurity:    Worry: Not on file    Inability: Not on file  . Transportation needs:    Medical: Not on file    Non-medical: Not on file  Tobacco Use  . Smoking status: Never Smoker  . Smokeless tobacco: Never Used  Substance and Sexual Activity  . Alcohol use: No  . Drug use: No  . Sexual activity: Yes  Lifestyle  . Physical activity:    Days per week: Not on file    Minutes per session: Not on file  .  Stress: Not on file  Relationships  . Social connections:    Talks on phone: Not on file    Gets together: Not on file    Attends religious service: Not on file    Active member of club or organization: Not on file    Attends meetings of clubs or organizations: Not on file    Relationship status: Not on file  . Intimate partner violence:    Fear of current or ex partner: Not on file    Emotionally abused: Not on file    Physically abused: Not on file    Forced sexual activity: Not on file  Other Topics Concern  . Not on file  Social History Narrative   Married with 3 children   Right handed   8th grade   1 cup coffee daily      PHYSICAL EXAM  Vitals:   06/29/17 1249  Height: 5\' 6"  (1.676 m)   Body mass index is 24.39 kg/m.   MMSE - Mini Mental State Exam 06/26/2016 12/26/2015 06/25/2015  Orientation to time 0 1 0  Orientation to Place 2 3 3   Registration 0 3 3  Attention/ Calculation 0 0 0  Recall 0 1 0  Language- name 2 objects 1 2 2   Language- repeat 0 1 1  Language- follow 3 step command 3 3 3   Language- read & follow direction 0 0 0  Write a sentence 0 0 0  Copy design 0 0 0  Total score 6 14 12      Generalized: Well developed, in no acute distress   Neurological examination  Mentation: Alert. Follows all commands intermittently.  Speech is limited Cranial nerve II-XII: Pupils were equal round reactive to light. Extraocular movements were full, visual field were full on confrontational test. Facial sensation and strength were normal. Uvula tongue midline. Head turning and shoulder shrug  were normal and symmetric. Motor: The motor testing reveals 5 over 5 strength of all 4 extremities. Good symmetric motor tone is noted throughout.  Sensory: Sensory testing is intact to soft touch on all 4 extremities. No evidence of extinction is noted.  Coordination: Cerebellar testing reveals good finger-nose-finger bilaterally difficulty with heel-to-shin bilaterally.    Gait and station: Gait is normal.  Reflexes: Deep tendon reflexes are symmetric and normal bilaterally.   DIAGNOSTIC DATA (LABS, IMAGING, TESTING) - I reviewed patient records, labs, notes, testing and imaging myself where available.  Lab Results  Component Value Date   WBC 4.7 04/30/2017   HGB 10.4 (L) 04/30/2017   HCT 33.0 (L) 04/30/2017   MCV 89 04/30/2017   PLT 295 04/30/2017      Component Value Date/Time   NA 143 04/30/2017 1632   K 3.7 04/30/2017 1632   CL 104 04/30/2017 1632  CO2 24 04/30/2017 1632   GLUCOSE 91 04/30/2017 1632   GLUCOSE 83 03/08/2017 0313   BUN 22 04/30/2017 1632   CREATININE 1.10 (H) 04/30/2017 1632   CREATININE 0.63 12/19/2014 1246   CALCIUM 9.7 04/30/2017 1632   PROT 6.9 03/16/2017 1724   ALBUMIN 4.2 03/16/2017 1724   AST 14 03/16/2017 1724   ALT 9 03/16/2017 1724   ALKPHOS 75 03/16/2017 1724   BILITOT 0.2 03/16/2017 1724   GFRNONAA 51 (L) 04/30/2017 1632   GFRAA 59 (L) 04/30/2017 1632   Lab Results  Component Value Date   CHOL 250 (H) 02/23/2017   HDL 69 02/23/2017   LDLCALC 164 (H) 02/23/2017   TRIG 87 02/23/2017   CHOLHDL 3.6 02/23/2017   Lab Results  Component Value Date   HGBA1C 6.1 (H) 02/02/2017   Lab Results  Component Value Date   VITAMINB12 333 03/07/2017   Lab Results  Component Value Date   TSH 1.576 03/07/2017      ASSESSMENT AND PLAN 72 y.o. year old female  has a past medical history of Alzheimer's disease (12/28/2014), Dementia, Headache disorder (12/28/2014), Memory disturbance (12/28/2014), Pulmonary embolus ([place]) (02/23/2016), and Right femoral vein DVT ([place]) (02/24/2016). here with:  1.  Memory disturbance  We were unable to check a MMSE today.  The patient has loss approximately 30 pounds since her last visit.  I will decrease Aricept to 5 mg at bedtime.  In the future this medication may need to be discontinued.  I have encouraged the patient's husband to continue to supplement with Ensure as well as  encouraging the patient to eat meals.  The patient and her husband voiced understanding.  He will follow-up in 6 months or sooner if needed.   I spent 15 minutes with the patient. 50% of this time was spent discussing medication changes   Ward Givens, MSN, NP-C 06/29/2017, 12:57 PM [facility] [address], [place], [place] [address] [phone]

## 2017-07-01 ENCOUNTER — Encounter: Payer: Self-pay | Admitting: Family Medicine

## 2017-07-01 ENCOUNTER — Other Ambulatory Visit: Payer: Self-pay

## 2017-07-01 ENCOUNTER — Ambulatory Visit (INDEPENDENT_AMBULATORY_CARE_PROVIDER_SITE_OTHER): Payer: Medicare Other | Admitting: Family Medicine

## 2017-07-01 VITALS — BP 103/72 | HR 90 | Temp 98.2°F | Resp 16 | Ht 66.0 in | Wt 137.0 lb

## 2017-07-01 DIAGNOSIS — R634 Abnormal weight loss: Secondary | ICD-10-CM

## 2017-07-01 DIAGNOSIS — F0281 Dementia in other diseases classified elsewhere with behavioral disturbance: Secondary | ICD-10-CM

## 2017-07-01 DIAGNOSIS — I10 Essential (primary) hypertension: Secondary | ICD-10-CM | POA: Diagnosis not present

## 2017-07-01 DIAGNOSIS — D649 Anemia, unspecified: Secondary | ICD-10-CM | POA: Diagnosis not present

## 2017-07-01 DIAGNOSIS — G300 Alzheimer's disease with early onset: Secondary | ICD-10-CM | POA: Diagnosis not present

## 2017-07-01 DIAGNOSIS — N179 Acute kidney failure, unspecified: Secondary | ICD-10-CM | POA: Diagnosis not present

## 2017-07-01 LAB — CBC WITH DIFFERENTIAL/PLATELET
BASOS ABS: 0 10*3/uL (ref 0.0–0.2)
Basos: 1 %
EOS (ABSOLUTE): 0.1 10*3/uL (ref 0.0–0.4)
Eos: 1 %
HEMOGLOBIN: 12.6 g/dL (ref 11.1–15.9)
Hematocrit: 37.4 % (ref 34.0–46.6)
IMMATURE GRANS (ABS): 0 10*3/uL (ref 0.0–0.1)
Immature Granulocytes: 0 %
LYMPHS: 40 %
Lymphocytes Absolute: 1.6 10*3/uL (ref 0.7–3.1)
MCH: 29 pg (ref 26.6–33.0)
MCHC: 33.7 g/dL (ref 31.5–35.7)
MCV: 86 fL (ref 79–97)
MONOCYTES: 8 %
Monocytes Absolute: 0.3 10*3/uL (ref 0.1–0.9)
NEUTROS ABS: 2.1 10*3/uL (ref 1.4–7.0)
Neutrophils: 50 %
PLATELETS: 329 10*3/uL (ref 150–450)
RBC: 4.34 x10E6/uL (ref 3.77–5.28)
RDW: 13.9 % (ref 12.3–15.4)
WBC: 4.1 10*3/uL (ref 3.4–10.8)

## 2017-07-01 LAB — COMPREHENSIVE METABOLIC PANEL
A/G RATIO: 1.3 (ref 1.2–2.2)
ALT: 9 IU/L (ref 0–32)
AST: 15 IU/L (ref 0–40)
Albumin: 4.5 g/dL (ref 3.5–4.8)
Alkaline Phosphatase: 72 IU/L (ref 39–117)
BILIRUBIN TOTAL: 0.3 mg/dL (ref 0.0–1.2)
BUN/Creatinine Ratio: 21 (ref 12–28)
BUN: 39 mg/dL — AB (ref 8–27)
CHLORIDE: 102 mmol/L (ref 96–106)
CO2: 22 mmol/L (ref 20–29)
Calcium: 10.3 mg/dL (ref 8.7–10.3)
Creatinine, Ser: 1.88 mg/dL — ABNORMAL HIGH (ref 0.57–1.00)
GFR calc Af Amer: 31 mL/min/{1.73_m2} — ABNORMAL LOW (ref >59)
GFR calc non Af Amer: 27 mL/min/{1.73_m2} — ABNORMAL LOW (ref >59)
GLUCOSE: 99 mg/dL (ref 65–99)
Globulin, Total: 3.6 g/dL (ref 1.5–4.5)
POTASSIUM: 4.4 mmol/L (ref 3.5–5.2)
Sodium: 140 mmol/L (ref 134–144)
TOTAL PROTEIN: 8.1 g/dL (ref 6.0–8.5)

## 2017-07-01 MED ORDER — TRIAMTERENE-HCTZ 37.5-25 MG PO TABS: 0.5000 | ORAL_TABLET | Freq: Every day | ORAL | 0 refills | Status: DC

## 2017-07-01 NOTE — Progress Notes (Signed)
Subjective:  By signing my name below, I, Tammy [name], attest that this documentation has been prepared under the direction and in the presence of Delman Cheadle, MD Electronically Signed: Ladene Artist, ED Scribe 07/01/2017 at 10:07 AM.   Patient ID: Tammy [name], female    DOB: 1945-11-26, 72 y.o.   MRN: [id number]  Chief Complaint  Patient presents with  . Anorexia    has went from 160 to 137 in six months, appetite has decreased    HPI Tammy [name] is a 72 y.o. female who presents to Primary Care at [facility] complaining of decreased appetite and weight loss. Pt was seen at neuro 2 days ago. Has lost 15 lbs in the past 2 months. Alzheimer's is progressing. Often has not shown any interest in eating healthy food though prior would willingly eat junk food. She has been requiring assistance with all ADLs. Husband has been trying to be supplement with Ensure. Her MMSE had decreased from 14 6 months ago to 6. Noted to have limited speech, follow intermittent commands but neuro exam was otherwise normal. Aricept was decreased from 10 mg to 5, advised this may need to be discontinued. - Husband Sterling Big) states pt still remembers certain ppl and is able to communicate with ppl, she just doesn't remember the conversation when it's over. He states that pt just doesn't have the desire to eat, but states she drinks a lot of water. She is drinking about 1 Ensure a day, typically 1/2 bottle at a time. States she sleeps a lot at night and during the day. He states that a nurse came out to speak with them but no one else came out. He reports that he plans to continue to care for her at home with the assistance of their daughter; doesn't anticipate sending her to a nursing home. He denies bladder/bowel incontinence at this time, changes in her mood, constipation at this time (husband reports BMs every other day). Pt denies abdominal pain, breast tenderness, vaginal bleeding, changes in urine, cough, sob. Pt had XR abdomen  in Jan and CT of abd/pelvis in Oct.  Wt Readings from Last 3 Encounters:  07/01/17 137 lb (62.1 kg)  06/29/17 137 lb 3.2 oz (62.2 kg)  04/30/17 151 lb 1.6 oz (68.5 kg)   Past Medical History:  Diagnosis Date  . Alzheimer's disease 12/28/2014  . Dementia   . Headache disorder 12/28/2014  . Memory disturbance 12/28/2014  . Pulmonary embolus ([place]) 02/23/2016  . 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 donepezil (ARICEPT) 5 MG tablet Take 1 tablet (5 mg total) by mouth at bedtime. 30 tablet 11  . feeding supplement, ENSURE ENLIVE, (ENSURE ENLIVE) LIQD Take 237 mLs by mouth 2 (two) times daily between meals. 237 mL 12  . Melatonin 5 MG TABS Take 1 tablet (5 mg total) by mouth at bedtime. About 1-2 hours before bed  0  . mirtazapine (REMERON) 45 MG tablet Take 1 tablet (45 mg total) by mouth at bedtime. 90 tablet 1  . rivaroxaban (XARELTO) 20 MG TABS tablet Take 1 tablet (20 mg total) by mouth daily with supper. 90 tablet 1  . senna-docusate (SENOKOT-S) 8.6-50 MG tablet Take 4 tablets by mouth at bedtime. 360 tablet 1  . tamsulosin (FLOMAX) 0.4 MG CAPS capsule Take 0.4 mg by mouth.    . triamterene-hydrochlorothiazide (MAXZIDE-25) 37.5-25 MG tablet Take 1 tablet by mouth  daily. 90 tablet 0   No current facility-administered medications on file prior to visit.    Allergies  Allergen Reactions  . Sulfa Antibiotics Palpitations   Review of Systems  Constitutional: Positive for appetite change and unexpected weight change.  Respiratory: Negative for cough and shortness of breath.   Gastrointestinal: Negative for abdominal pain and constipation.  Genitourinary: Negative for hematuria and vaginal bleeding.  Psychiatric/Behavioral: Negative for agitation and sleep disturbance.      Objective:   Physical Exam  Constitutional: She is oriented to person, place, and time. She appears well-developed and  well-nourished. No distress.  HENT:  Head: Normocephalic and atraumatic.  Eyes: Conjunctivae and EOM are normal.  Neck: Neck supple. No tracheal deviation present.  Cardiovascular: Normal rate.  Pulmonary/Chest: Effort normal. No respiratory distress.  Musculoskeletal: Normal range of motion.  Neurological: She is alert and oriented to person, place, and time.  Skin: Skin is warm and dry.  Psychiatric: Her behavior is normal.  Affect was flat. No spontaneous speech. Answers questions with single word. Unable to follow multi-step command.  Nursing note and vitals reviewed.  BP 103/72   Pulse 90   Temp 98.2 F (36.8 C)   Resp 16   Ht 5\' 6"  (1.676 m)   Wt 137 lb (62.1 kg)   SpO2 99%   BMI 22.11 kg/m     Assessment & Plan:   1. Unintentional weight loss of more than 10 pounds in 90 days   2. Acute renal failure, unspecified acute renal failure type ([place])   3. Early onset Alzheimer's disease with behavioral disturbance   4. Essential hypertension - decrease maxzide to 1/2 tab daily  5. Normocytic anemia     Orders Placed This Encounter  Procedures  . Comprehensive metabolic panel  . CBC with Differential/Platelet  . Amb Referral to Palliative Care    Referral Priority:   Routine    Referral Type:   Consultation    Referral Reason:   Symptom Managment    Number of Visits Requested:   1    Meds ordered this encounter  Medications  . triamterene-hydrochlorothiazide (MAXZIDE-25) 37.5-25 MG tablet    Sig: Take 0.5 tablets by mouth daily.    Dispense:  45 tablet    Refill:  0    I personally performed the services described in this documentation, which was scribed in my presence. The recorded information has been reviewed and considered, and addended by me as needed.   Delman Cheadle, M.D.  Primary Care at [facility] [address] [place], [place] [address] [phone] phone [phone] fax  08/12/17 12:03 PM

## 2017-07-01 NOTE — Patient Instructions (Addendum)
IF you received an x-ray today, you will receive an invoice from [facility]. Please contact [facility] at [phone] with questions or concerns regarding your invoice.   IF you received labwork today, you will receive an invoice from [place]. Please contact [facility] at [phone] with questions or concerns regarding your invoice.   Our billing staff will not be able to assist you with questions regarding bills from these companies.  You will be contacted with the lab results as soon as they are available. The fastest way to get your results is to activate your My Chart account. Instructions are located on the last page of this paperwork. If you have not heard from [place] regarding the results in 2 weeks, please contact this office.    High-Protein and High-Calorie Diet Eating high-protein and high-calorie foods can help you to gain weight, heal after an injury, and recover after an illness or surgery. What is my plan? The specific amount of daily protein and calories you need depends on:  Your body weight.  The reason this diet is recommended for you.  Generally, a high-protein, high-calorie diet involves:  Eating 250-500 extra calories each day.  Making sure that 10-35% of your daily calories come from protein.  Talk to your health care provider about how much protein and how many calories you need each day. Follow the diet as directed by your health care provider. What do I need to know about this diet?  Ask your health care provider if you should take a nutritional supplement.  Try to eat six small meals each day instead of three large meals.  Eat a balanced diet, including one food that is high in protein at each meal.  Keep nutritious snacks handy, such as nuts, trail mixes, dried fruit, and yogurt.  If you have kidney disease or diabetes, eating too much protein may put extra stress on your kidneys. Talk to your health care provider if you have  either of those conditions. What are some high-protein foods? Grains Quinoa. Bulgur wheat. Vegetables Soybeans. Peas. Meats and Other Protein Sources Beef, pork, and poultry. Fish and seafood. Eggs. Tofu. Textured vegetable protein (TVP). Peanut butter. Nuts and seeds. Dried beans. Protein powders. Dairy Whole milk. Whole-milk yogurt. Powdered milk. Cheese. [employer]. Eggnog. Beverages High-protein supplement drinks. Soy milk. Other Protein bars. The items listed above may not be a complete list of recommended foods or beverages. Contact your dietitian for more options. What are some high-calorie foods? Grains Pasta. Quick breads. Muffins. Pancakes. Ready-to-eat cereal. Vegetables Vegetables cooked in oil or butter. Fried potatoes. Fruits Dried fruit. Fruit leather. Canned fruit in syrup. Fruit juice. Avocados. Meats and Other Protein Sources Peanut butter. Nuts and seeds. Dairy Heavy cream. Whipped cream. Cream cheese. Sour cream. Ice cream. Custard. Pudding. Beverages Meal-replacement beverages. Nutrition shakes. Fruit juice. Sugar-sweetened soft drinks. Condiments Salad dressing. Mayonnaise. Alfredo sauce. Fruit preserves or jelly. Honey. Syrup. Sweets/Desserts Cake. Cookies. Pie. Pastries. Candy bars. Chocolate. Fats and Oils Butter or margarine. Oil. Gravy. Other Meal-replacement bars. The items listed above may not be a complete list of recommended foods or beverages. Contact your dietitian for more options. What are some tips for including high-protein and high-calorie foods in my diet?  Add whole milk, half-and-half, or heavy cream to cereal, pudding, soup, or hot cocoa.  Add whole milk to instant breakfast drinks.  Add peanut butter to oatmeal or smoothies.  Add powdered milk to baked goods, smoothies, or milkshakes.  Add powdered milk,  cream, or butter to mashed potatoes.  Add cheese to cooked vegetables.  Make whole-milk yogurt parfaits. Top them  with granola, fruit, or nuts.  Add cottage cheese to your fruit.  Add avocados, cheese, or both to sandwiches or salads.  Add meat, poultry, or seafood to rice, pasta, casseroles, salads, and soups.  Use mayonnaise when making egg salad, chicken salad, or tuna salad.  Use peanut butter as a topping for pretzels, celery, or crackers.  Add beans to casseroles, dips, and spreads.  Add pureed beans to sauces and soups.  Replace calorie-free drinks with calorie-containing drinks, such as milk and fruit juice. This information is not intended to replace advice given to you by your health care provider. Make sure you discuss any questions you have with your health care provider. Document Released: 12/30/2004 Document Revised: 06/07/2015 Document Reviewed: 06/14/2013 Elsevier Interactive Patient Education  2018 [place] Disease Caregiver Guide Alzheimer disease is a brain disease that causes memory loss and changes in behavior. People with Alzheimer disease often have problems paying attention, communicating, and doing routine tasks. The disease gets worse over time, and people with the disease eventually need full-time care. Taking care of someone with Alzheimer disease can be challenging and overwhelming. This guide provides helpful information and tips that can make caring for someone with the disease a little easier. What changes does Alzheimer disease cause? Alzheimer disease causes a person to lose the ability to remember things and make decisions. Memory loss and confusion are usually mild at the start of the disease, but they get more severe over time. Eventually, the person may not recognize friends, family members, or familiar places. Alzheimer disease can also cause hallucinations, changes in behavior, and changes in mood, such as anxiety or anger. The changes can come on suddenly. They may happen in response to something such as:  Pain.  An infection.  Changes in  environment, such as changes in temperature or noise.  Overstimulation.  Feeling lost or scared.  Tips for managing symptoms  Be calm and patient.  Give short, simple answers to questions. Long answers can overwhelm and confuse the person.  Avoid correcting the person in a negative way.  Try not to take things personally, even if the person forgets your name. Understand that changes are a part of the disease process.  Do not argue or try to convince the person about a specific point. Doing that may make the person feel more agitated. Tips for reducing frustration  Make appointments and do daily tasks, like bathing and dressing, when the person is at his or her best.  Allow for plenty of time for simple tasks because they may take longer than expected. Take your time when doing these tasks.  Limit the person's choices. Too many choices can be overwhelming and stressful for the person.  Involve the person in what you are doing.  Keep a daily routine.  Avoid crowds and new situations, if possible.  Use simple words, short sentences, and a calm voice. Only give one direction at a time.  Buy clothes and shoes that are easy to put on and take off.  Organize medications in a pillbox for each day of the week.  Keep a calendar in a central location to remind the person of appointments or other activities.  Ask about respite care resources so that you can have a regular break from the stress of caregiving. Tips for reducing the risk of injury  Keep floors clear of  clutter. Remove rugs, magazine racks, and floor lamps.  Keep hallways well-lit, especially at night.  Put a handrail and nonslip mat in the bathtub or shower.  Put childproof locks on cabinets that contain dangerous items, such as medicines, alcohol, guns, toxic cleaning items, sharp tools or utensils, matches, and lighters.  Put locks on doors. Put the locks in places where the person cannot see or reach them  easily. This will help ensure that the person does not wander out of the house and get lost.  Be prepared for emergencies. Keep a list of emergency phone numbers and addresses in a convenient area.  Remove car keys and lock garage doors so that the person does not try to get in the car and drive.  A certain type of bracelet may be worn that tracks a person's location and identifies him or her as having memory problems. This should be worn at all times for safety. Tips for future planning  Discuss financial and legal planning early on in the course of the disease. People with Alzheimer disease will have trouble managing their money as the disease gets worse. Get help from professional advisers regarding financial and legal matters.  Discuss advance directives, safety, and daily care. Take these steps: ? Create a living will and choose a power of attorney. The person with power of attorney will be able to make decisions for the person with Alzheimer disease when he or she is no longer able to. ? Discuss driving safety and when to stop driving. The person's health care provider can help provide assistance with this decision. ? Discuss the person's living situation. If the person lives alone, make sure he or she is safe. People who live at home may need extra help from home health caregivers, and those who live in a nursing home or care center may need more care. Where to find support: One way to find support is to join a local support group. Advantages of being part of a support group include:  Learning strategies to manage stress.  Sharing experiences with others.  Receiving emotional comfort and support.  Learning about caregiving as the disease progresses.  Knowing what community resources are available and making use of them.  Where to find more information:  Alzheimer's Association: [employer] Contact a health care provider if:  The person has a fever.  The person has a sudden  change in behavior that does not improve with calming strategies.  The person is unable to manage in his or her current living situation.  The person threatens himself or herself, you, or anyone else.  You are no longer able to care for the person. Summary  Alzheimer disease is a brain disease that causes memory loss and changes in behavior.  People with Alzheimer disease often have problems paying attention, communicating, and doing routine tasks. The disease gets worse over time, and people with the disease eventually need full-time care.  Take steps to reduce the person's risk of injury, and plan for future care.  Taking care of someone with Alzheimer disease can be very challenging and overwhelming. One way to find support during this time is to join a local support group. This information is not intended to replace advice given to you by your health care provider. Make sure you discuss any questions you have with your health care provider. Document Released: 09/11/2003 Document Revised: 02/01/2016 Document Reviewed: 02/01/2016 Elsevier Interactive Patient Education  2018 [employer]. Failure to Thrive, Adult Failure to thrive  is a group of symptoms that affect elderly adults. These symptoms include loss of appetite and weight loss. People who have this condition may do fewer and fewer activities over time. They may lose interest in being with friends or they may not want to eat or drink. This condition is not a normal part of aging. What are the causes? This condition may be caused by:  A disease, such dementia, diabetes, cancer, or lung disease.  A health problem, such as a vitamin deficiency or a heart problem.  A disorder, such as depression.  A disability.  Medicines.  Mistreatment or neglect.  In some cases, the cause may not be known. What are the signs or symptoms? Symptoms of this condition include:  Loss of more than 5% of your body weight.  Being more tired  than normal after an activity.  Having trouble getting up after sitting.  Loss of appetite.  Not getting out of bed.  Not wanting to do usual activities.  Depression.  Getting infections often.  Bedsores.  Taking a long time to recover after an injury or a surgery.  Weakness.  How is this diagnosed? This condition may be diagnosed with a physical exam. Your health care provider will ask questions about your health, behavior, and mood, such as:  Has your activity changed?  Do you seem sad?  Are your eating habits different?  Tests may also be done. They may include:  Blood tests.  Urine tests.  Imaging tests, such as X-rays, a CT scan, or MRI.  Hearing tests.  Vision tests.  Tests to check thinking ability (cognitive tests).  Activity tests to see if you can do tasks such as bathing and dressing and to see if you can move around safely.  You may be referred to a specialist. How is this treated? Treatment for this condition depends on the cause. It may involve:  Treating the cause.  Talk therapy or medicine to treat depression.  Improving diet, such as by eating more often or taking nutritional supplements.  Changing or stopping a medicine.  Physical therapy.  It often takes a team of health care providers to find the right treatment. Follow these instructions at home:  Take over-the-counter and prescription medicines only as told by your health care provider.  Eat a healthy, well-balanced diet. Make sure to get enough calories in each meal.  Be physically active. Include strength training as part of your exercise routine. A physical therapist can help to set up an exercise program that fits you.  Make sure that you are safe at home.  Make sure that you have a plan for what to do if you become unable to make decisions for yourself. Contact a health care provider if:  You are not able to eat well.  You are not able to move around.  You feel  very sad or hopeless. Get help right away if:  You have thoughts of ending your life.  You cannot eat or drink.  You do not get out of bed.  Staying at home is no longer safe.  You have a fever. This information is not intended to replace advice given to you by your health care provider. Make sure you discuss any questions you have with your health care provider. Document Released: 03/24/2011 Document Revised: 06/07/2015 Document Reviewed: 03/27/2014 Elsevier Interactive Patient Education  [employer].

## 2017-07-02 ENCOUNTER — Telehealth: Payer: Self-pay

## 2017-07-02 NOTE — Telephone Encounter (Signed)
Message left for patient to offer to schedule a visit with Palliative Care.

## 2017-07-03 ENCOUNTER — Telehealth: Payer: Self-pay

## 2017-07-03 ENCOUNTER — Other Ambulatory Visit: Payer: Medicare Other | Admitting: Internal Medicine

## 2017-07-03 DIAGNOSIS — Z7189 Other specified counseling: Secondary | ICD-10-CM

## 2017-07-03 DIAGNOSIS — R413 Other amnesia: Secondary | ICD-10-CM

## 2017-07-03 DIAGNOSIS — R630 Anorexia: Secondary | ICD-10-CM

## 2017-07-03 NOTE — Telephone Encounter (Signed)
Received phone call from patient's husband to schedule visit with Palliative Care. Scheduled for today at 1 pm. Address verified.

## 2017-07-08 DIAGNOSIS — R630 Anorexia: Secondary | ICD-10-CM | POA: Insufficient documentation

## 2017-07-08 DIAGNOSIS — Z7189 Other specified counseling: Secondary | ICD-10-CM | POA: Insufficient documentation

## 2017-07-08 NOTE — Progress Notes (Signed)
PALLIATIVE CARE CONSULT VISIT   PATIENT NAME: Tammy [name] DOB: 12/15/1945 MRN: [id number]  PRIMARY CARE PROVIDER:   [name], MD  REFERRING PROVIDER:  [name], MD [place], [place] [address]  RESPONSIBLE PARTY:   Destine Zirkle (Husband)  [phone], [phone] cell    RECOMMENDATIONS and PLAN:   1.  Memory disturbance without behavioral changes.  R41.3  FAST stage 6a.  Controlled with use of Aricept. Receives personal assistance, meals and meds from caregiver husb and.  Spouse will question local [facility] representative in reference to possible assistance at home with patient. Discussed home and patient safety.  2.  Loss of appetite R63.0:  Gradual improvement since beginning use of Remeron.  Small, frequent healthy meals and snacks.  Monthly weights.   3.  Advanced Care Planning/counseling  Z71.89:  Reviewed Palliative and Hospice care services.  Goals of Care: Husband will continue caregiving at home, private caregivers in the future prn.   Full scope of treatment and Full Code.  Palliative care will continue to follow.   I spent  60 providing this consultation at home, from 1:00pm to 2:00pm. More than 50% of the time in this consultation was spent coordinating communication with pt and husband.   HISTORY OF PRESENT ILLNESS:  Tammy [name] is a 72 y.o. year old female with multiple medical problems including dementia without behavioral changes, HTN and pulmonary embolism. Husband reports a gradual cognitive decline but most recently, has experienced loss of appetite with weight loss of at least 20 lbs over last 6 months. She is ambulatory, continent and is able to feel self.  Her husband is her only caregiver.   She has wandered away from home in the past. Palliative Care was asked to help address goals of care.   CODE STATUS: FULL CODE  PPS: 50% HOSPICE ELIGIBILITY/DIAGNOSIS: TBD  PAST MEDICAL HISTORY:  Past Medical History:  Diagnosis Date  . Alzheimer's  disease 12/28/2014  . Dementia   . Headache disorder 12/28/2014  . Memory disturbance 12/28/2014  . Pulmonary embolus ([place]) 02/23/2016  . 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 Smoking status: Never Smoker  . Smokeless tobacco: Never Used  Substance Use Topics  . Alcohol use: No    ALLERGIES:  Allergies  Allergen Reactions  . Sulfa Antibiotics Palpitations     PERTINENT MEDICATIONS:  Outpatient Encounter Medications as of 07/03/2017  Medication Sig  . donepezil (ARICEPT) 5 MG tablet Take 1 tablet (5 mg total) by mouth at bedtime.  . feeding supplement, ENSURE ENLIVE, (ENSURE ENLIVE) LIQD Take 237 mLs by mouth 2 (two) times daily between meals.  . Melatonin 5 MG TABS Take 1 tablet (5 mg total) by mouth at bedtime. About 1-2 hours before bed  . mirtazapine (REMERON) 45 MG tablet Take 1 tablet (45 mg total) by mouth at bedtime.  . rivaroxaban (XARELTO) 20 MG TABS tablet Take 1 tablet (20 mg total) by mouth daily with supper.  . senna-docusate (SENOKOT-S) 8.6-50 MG tablet Take 4 tablets by mouth at bedtime.  . triamterene-hydrochlorothiazide (MAXZIDE-25) 37.5-25 MG tablet Take 0.5 tablets by mouth daily.   No facility-administered encounter medications on file as of 07/03/2017.     PHYSICAL EXAM:   General:  Thin elderly female sitting in home and in NAD. Cardiovascular: regular rate and rhythm Pulmonary: clear in all  fields Abdomen: soft, nontender, + bowel sounds Extremities: no  edema or ecchymosis Skin: Exposed skin is intact Neurological: Alert and oriented to person and place.  Speaks in 3-4 word sentences.  Ambulatory without assistance.  Gonzella Lex, NP-C

## 2017-07-09 ENCOUNTER — Other Ambulatory Visit: Payer: Self-pay | Admitting: Family Medicine

## 2017-08-07 ENCOUNTER — Other Ambulatory Visit: Payer: Medicare Other | Admitting: Internal Medicine

## 2017-08-12 ENCOUNTER — Other Ambulatory Visit: Payer: Self-pay

## 2017-08-12 ENCOUNTER — Ambulatory Visit (INDEPENDENT_AMBULATORY_CARE_PROVIDER_SITE_OTHER): Payer: Medicare Other | Admitting: Family Medicine

## 2017-08-12 ENCOUNTER — Encounter: Payer: Self-pay | Admitting: Family Medicine

## 2017-08-12 ENCOUNTER — Ambulatory Visit (INDEPENDENT_AMBULATORY_CARE_PROVIDER_SITE_OTHER): Payer: Medicare Other

## 2017-08-12 VITALS — BP 132/87 | HR 115 | Temp 98.0°F | Resp 16 | Ht 66.0 in | Wt 124.2 lb

## 2017-08-12 DIAGNOSIS — R634 Abnormal weight loss: Secondary | ICD-10-CM | POA: Diagnosis not present

## 2017-08-12 DIAGNOSIS — F0280 Dementia in other diseases classified elsewhere without behavioral disturbance: Secondary | ICD-10-CM

## 2017-08-12 DIAGNOSIS — I10 Essential (primary) hypertension: Secondary | ICD-10-CM | POA: Diagnosis not present

## 2017-08-12 DIAGNOSIS — N179 Acute kidney failure, unspecified: Secondary | ICD-10-CM | POA: Diagnosis not present

## 2017-08-12 DIAGNOSIS — K5900 Constipation, unspecified: Secondary | ICD-10-CM

## 2017-08-12 DIAGNOSIS — I82409 Acute embolism and thrombosis of unspecified deep veins of unspecified lower extremity: Secondary | ICD-10-CM

## 2017-08-12 DIAGNOSIS — G300 Alzheimer's disease with early onset: Secondary | ICD-10-CM

## 2017-08-12 MED ORDER — MEGESTROL ACETATE 40 MG PO TABS: 200.0000 mg | ORAL_TABLET | Freq: Two times a day (BID) | ORAL | 0 refills | Status: DC

## 2017-08-12 NOTE — Progress Notes (Addendum)
I,Tammy [name],acting as a scribe for Tammy Knapp, MD.,have documented all relevant documentation on the behalf of Tammy Knapp, MD,as directed by  Tammy Knapp, MD while in the presence of Tammy Knapp, MD. 08/12/2017 12:00 PM   Subjective:    Patient ID: Tammy [name], female    DOB: Feb 13, 1945, 72 y.o.   MRN: [id number]  Chief Complaint  Patient presents with  . Unintentional Weight Loss    pt states her memory is still the same since last OV and she continues to loss weight     HPI Tammy [name] is a 72 y.o. female who presents to Primary Care at [facility] complaining of unintentional weight loss. She is accompanied by her husband [name]. The pt was seen 1 month ago and anemia was resolved. After this visit, she had acute renal failure from dehydration. She had a home visit with palliative care 2 days after her visit with me, who noted that her weight had increased after she began Remeron. Her weight actually continued to drop. The pt is on maximal doses of Remeron.   The patient's husband notes that she never verbally says when she is hungry, and she eats minimally. The patient's husband tell her to eat and continually checks on her, but if he walks away, she stops eating. He normally buys out-to-eat food during the week. He cooks more on the weekends. The pt drinks protein drinks twice per day. She denies nausea. She has bowel movements about 3 times per week, which consist of small stool. She denies abdominal pain. She drinks about 4-5 glasses of water per day. She also takes chewable multivitamins.   Patient Active Problem List   Diagnosis Date Noted  . Loss of appetite 07/08/2017  . Advanced care planning/counseling discussion 07/08/2017  . Acute pulmonary embolism ([place]) 03/07/2017  . Acute DVT (deep venous thrombosis) ([place]) 03/07/2017  . Normocytic anemia 03/07/2017  . History of deep vein thrombosis (DVT) of lower extremity 03/06/2017  . History of pulmonary embolism 03/06/2017  .  Unintentional weight loss of more than 5% body weight within 1 month 02/23/2017  . Essential hypertension 12/31/2016  . Pelvic mass in female 03/11/2016  . Ovarian mass 02/24/2016  . Vitamin D deficiency 01/02/2015  . Prediabetes 01/02/2015  . Memory disturbance 12/28/2014  . Alzheimer's disease 12/28/2014   Past Medical History:  Diagnosis Date  . Alzheimer's disease 12/28/2014  . Dementia   . Headache disorder 12/28/2014  . Memory disturbance 12/28/2014  . Pulmonary embolus ([place]) 02/23/2016  . Right femoral vein DVT ([place]) 02/24/2016   History reviewed. No pertinent surgical history. Allergies  Allergen Reactions  . Sulfa Antibiotics Palpitations   Prior to Admission medications   Medication Sig Start Date End Date Taking? Authorizing Provider  donepezil (ARICEPT) 10 MG tablet TK 1 T PO HS 07/09/17  Yes [provider]  donepezil (ARICEPT) 5 MG tablet Take 1 tablet (5 mg total) by mouth at bedtime. 06/29/17  Yes Ward Givens, NP  feeding supplement, ENSURE ENLIVE, (ENSURE ENLIVE) LIQD Take 237 mLs by mouth 2 (two) times daily between meals. 03/08/17  Yes Hongalgi, Lenis Dickinson, MD  Melatonin 5 MG TABS Take 1 tablet (5 mg total) by mouth at bedtime. About 1-2 hours before bed 04/30/17  Yes Tammy Knapp, MD  mirtazapine (REMERON) 45 MG tablet Take 1 tablet (45 mg total) by mouth at bedtime. 04/30/17  Yes Tammy Knapp, MD  rivaroxaban (XARELTO) 20 MG TABS tablet  Take 1 tablet (20 mg total) by mouth daily with supper. 04/15/17  Yes Tammy Knapp, MD  senna-docusate (SENOKOT-S) 8.6-50 MG tablet Take 4 tablets by mouth at bedtime. 02/23/17  Yes Tammy Knapp, MD  triamterene-hydrochlorothiazide (MAXZIDE-25) 37.5-25 MG tablet Take 0.5 tablets by mouth daily. 07/01/17  Yes Tammy Knapp, MD   Social History   Socioeconomic History  . Marital status: Married    Spouse name: [name]  . Number of children: 3  . Years of education: 61  . Highest education level: Not on file  Occupational History    . Occupation: retired  Scientific laboratory technician  . Financial resource strain: Not on file  . Food insecurity:    Worry: Not on file    Inability: Not on file  . Transportation needs:    Medical: Not on file    Non-medical: Not on file  Tobacco Use  . Smoking status: Never Smoker  . Smokeless tobacco: Never Used  Substance and Sexual Activity  . Alcohol use: No  . Drug use: No  . Sexual activity: Yes  Lifestyle  . Physical activity:    Days per week: Not on file    Minutes per session: Not on file  . Stress: Not on file  Relationships  . Social connections:    Talks on phone: Not on file    Gets together: Not on file    Attends religious service: Not on file    Active member of club or organization: Not on file    Attends meetings of clubs or organizations: Not on file    Relationship status: Not on file  . Intimate partner violence:    Fear of current or ex partner: Not on file    Emotionally abused: Not on file    Physically abused: Not on file    Forced sexual activity: Not on file  Other Topics Concern  . Not on file  Social History Narrative   Married with 3 children   Right handed   8th grade   1 cup coffee daily    Review of Systems See hpi    Objective:   Physical Exam  Constitutional: She is oriented to person, place, and time. She appears well-developed and well-nourished. No distress.  HENT:  Head: Normocephalic and atraumatic.  Right Ear: External ear normal.  Left Ear: External ear normal.  Eyes: Conjunctivae are normal. No scleral icterus.  Neck: Normal range of motion. Neck supple. No thyromegaly present.  Cardiovascular: Normal rate, regular rhythm, normal heart sounds and intact distal pulses.  Pulmonary/Chest: Effort normal and breath sounds normal. No respiratory distress.  Abdominal: Normal appearance and bowel sounds are normal. There is generalized tenderness.  abd mildly distended and mildly firm - indistinct masses  Musculoskeletal: She exhibits  no edema.  Lymphadenopathy:    She has no cervical adenopathy.  Neurological: She is alert and oriented to person, place, and time.  Skin: Skin is warm and dry. She is not diaphoretic. No erythema.  Psychiatric: She has a normal mood and affect. Her behavior is normal.    Vitals:   08/12/17 1159  BP: 132/87  Pulse: (!) 115  Resp: 16  Temp: 98 F (36.7 C)  SpO2: 93%   Dg Abd 1 View  Result Date: 08/12/2017 CLINICAL DATA:  Unintentional weight loss.  Anorexia, dementia. EXAM: ABDOMEN - 1 VIEW COMPARISON:  02/02/2017 FINDINGS: Nonobstructive bowel gas pattern. Mild amount of stool in the colon and rectum. No urinary tract  calculi. Phleboliths in the pelvis. No acute skeletal abnormality. IMPRESSION: Normal bowel gas pattern with mild stool throughout the colon. Electronically Signed   By: Franchot Gallo M.D.   On: 08/12/2017 12:46       Assessment & Plan:   1. Unintentional weight loss of more than 10 pounds in 90 days - trial of megace, high protein, high Kcal diet. Continue mirtazapine, maxed out at 45 mg.  2. Acute renal failure, unspecified acute renal failure type ([place]) - push fluids; husband had forgotten to cut the Maxide in half but will do so.  If tolerates consider going off at follow-up.  When we took patient off diuretics twice before, she developed pedal edema but both of these incidences turned out to be DVTs.  She is now anticoagulated on Xarelto indefinitely after a second occurrence so I think it would be reasonable to try her off the diuretic as long as she tolerates half dose first  3. Constipation, unspecified constipation type - senokot S qhs increase as needed  4.      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  I am concerned the donepezil may be causing some nausea or decreasing her appetite.  I am not sure how much it is helping at this point as her dementia worsens.  Continue to discuss with her husband about the  benefits of palliative care and or hospice services.   5.      Essential hypertension- low-grade so should be able to wean off medication 6.      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 DG Abd 1 View    Standing Status:   Future    Number of Occurrences:   1    Standing Expiration Date:   08/12/2018    Order Specific Question:   Reason for Exam (SYMPTOM  OR DIAGNOSIS REQUIRED)    Answer:   unintentional weight loss, anorexia, few BM, dementia    Order Specific Question:   Preferred imaging location?    Answer:   External  . Comprehensive metabolic panel  . CBC with Differential/Platelet  . TSH  . Vitamin B12  . Hemoglobin A1c  . POCT urinalysis dipstick    Meds ordered this encounter  Medications  . megestrol (MEGACE) 40 MG tablet    Sig: Take 5 tablets (200 mg total) by mouth 2 (two) times daily.    Dispense:  300 tablet    Refill:  0    I personally performed the services described in this documentation, which was scribed in my presence. The recorded information has been reviewed and considered, and addended by me as needed.   Delman Cheadle, M.D.  Primary Care at [facility] [address] [place], [place] [address] [phone] phone [phone] fax  09/12/17 3:27 PM

## 2017-08-12 NOTE — Patient Instructions (Addendum)
IF you received an x-ray today, you will receive an invoice from [facility]. Please contact [facility] at [phone] with questions or concerns regarding your invoice.   IF you received labwork today, you will receive an invoice from [facility]. Please contact [facility] at [phone] with questions or concerns regarding your invoice.   Our billing staff will not be able to assist you with questions regarding bills from these companies.  You will be contacted with the lab results as soon as they are available. The fastest way to get your results is to activate your My Chart account. Instructions are located on the last page of this paperwork. If you have not heard from [place] regarding the results in 2 weeks, please contact this office.     High-Protein and High-Calorie Diet Eating high-protein and high-calorie foods can help you to gain weight, heal after an injury, and recover after an illness or surgery. What is my plan? The specific amount of daily protein and calories you need depends on:  Your body weight.  The reason this diet is recommended for you.  Generally, a high-protein, high-calorie diet involves:  Eating 250-500 extra calories each day.  Making sure that 10-35% of your daily calories come from protein.  Talk to your health care provider about how much protein and how many calories you need each day. Follow the diet as directed by your health care provider. What do I need to know about this diet?  Ask your health care provider if you should take a nutritional supplement.  Try to eat six small meals each day instead of three large meals.  Eat a balanced diet, including one food that is high in protein at each meal.  Keep nutritious snacks handy, such as nuts, trail mixes, dried fruit, and yogurt.  If you have kidney disease or diabetes, eating too much protein may put extra stress on your kidneys. Talk to your health care provider if you have  either of those conditions. What are some high-protein foods? Grains Quinoa. Bulgur wheat. Vegetables Soybeans. Peas. Meats and Other Protein Sources Beef, pork, and poultry. Fish and seafood. Eggs. Tofu. Textured vegetable protein (TVP). Peanut butter. Nuts and seeds. Dried beans. Protein powders. Dairy Whole milk. Whole-milk yogurt. Powdered milk. Cheese. Yahoo. Eggnog. Beverages High-protein supplement drinks. Soy milk. Other Protein bars. The items listed above may not be a complete list of recommended foods or beverages. Contact your dietitian for more options. What are some high-calorie foods? Grains Pasta. Quick breads. Muffins. Pancakes. Ready-to-eat cereal. Vegetables Vegetables cooked in oil or butter. Fried potatoes. Fruits Dried fruit. Fruit leather. Canned fruit in syrup. Fruit juice. Avocados. Meats and Other Protein Sources Peanut butter. Nuts and seeds. Dairy Heavy cream. Whipped cream. Cream cheese. Sour cream. Ice cream. Custard. Pudding. Beverages Meal-replacement beverages. Nutrition shakes. Fruit juice. Sugar-sweetened soft drinks. Condiments Salad dressing. Mayonnaise. Alfredo sauce. Fruit preserves or jelly. Honey. Syrup. Sweets/Desserts Cake. Cookies. Pie. Pastries. Candy bars. Chocolate. Fats and Oils Butter or margarine. Oil. Gravy. Other Meal-replacement bars. The items listed above may not be a complete list of recommended foods or beverages. Contact your dietitian for more options. What are some tips for including high-protein and high-calorie foods in my diet?  Add whole milk, half-and-half, or heavy cream to cereal, pudding, soup, or hot cocoa.  Add whole milk to instant breakfast drinks.  Add peanut butter to oatmeal or smoothies.  Add powdered milk to baked goods, smoothies, or milkshakes.  Add powdered milk, cream,  or butter to mashed potatoes.  Add cheese to cooked vegetables.  Make whole-milk yogurt parfaits. Top them  with granola, fruit, or nuts.  Add cottage cheese to your fruit.  Add avocados, cheese, or both to sandwiches or salads.  Add meat, poultry, or seafood to rice, pasta, casseroles, salads, and soups.  Use mayonnaise when making egg salad, chicken salad, or tuna salad.  Use peanut butter as a topping for pretzels, celery, or crackers.  Add beans to casseroles, dips, and spreads.  Add pureed beans to sauces and soups.  Replace calorie-free drinks with calorie-containing drinks, such as milk and fruit juice. This information is not intended to replace advice given to you by your health care provider. Make sure you discuss any questions you have with your health care provider. Document Released: 12/30/2004 Document Revised: 06/07/2015 Document Reviewed: 06/14/2013 Elsevier Interactive Patient Education  2018 [place] Content in Foods Generally, most healthy people need around 50 grams of protein each day. Depending on your overall health, you may need more or less protein in your diet. Talk to your health care provider or dietitian about how much protein you need. See the following list for the protein content of some common foods. High-protein foods High-protein foods contain 4 grams (4 g) or more of protein per serving. They include:  Beef, ground sirloin (cooked) - 3 oz have 24 g of protein.  Cheese (hard) - 1 oz has 7 g of protein.  Chicken breast, boneless and skinless (cooked) - 3 oz have 13.4 g of protein.  Cottage cheese - 1/2 cup has 13.4 g of protein.  Egg - 1 egg has 6 g of protein.  Fish, filet (cooked) - 1 oz has 6-7 g of protein.  Garbanzo beans (canned or cooked) - 1/2 cup has 6-7 g of protein.  Kidney beans (canned or cooked) - 1/2 cup has 6-7 g of protein.  Lamb (cooked) - 3 oz has 24 g of protein.  Milk - 1 cup (8 oz) has 8 g of protein.  Nuts (peanuts, pistachios, almonds) - 1 oz has 6 g of protein.  Peanut butter - 1 oz has 7-8 g of  protein.  Pork tenderloin (cooked) - 3 oz has 18.4 g of protein.  Pumpkin seeds - 1 oz has 8.5 g of protein.  Soybeans (roasted) - 1 oz has 8 g of protein.  Soybeans (cooked) - 1/2 cup has 11 g of protein.  Soy milk - 1 cup (8 oz) has 5-10 g of protein.  Soy or vegetable patty - 1 patty has 11 g of protein.  Sunflower seeds - 1 oz has 5.5 g of protein.  Tofu (firm) - 1/2 cup has 20 g of protein.  Tuna (canned in water) - 3 oz has 20 g of protein.  Yogurt - 6 oz has 8 g of protein.  Low-protein foods Low-protein foods contain 3 grams (3 g) or less of protein per serving. They include:  Beets (raw or cooked) - 1/2 cup has 1.5 g of protein.  Bran cereal - 1/2 cup has 2-3 g of protein.  Bread - 1 slice has 2.5 g of protein.  Broccoli (raw or cooked) - 1/2 cup has 2 g of protein.  Collard greens (raw or cooked) - 1/2 cup has 2 g of protein.  Corn (fresh or cooked) - 1/2 cup has 2 g of protein.  Cream cheese - 1 oz has 2 g of protein.  Creamer (half-and-half) - 1  oz has 1 g of protein.  Flour tortilla - 1 tortilla has 2.5 g of protein  Frozen yogurt - 1/2 cup has 3 g of protein.  Fruit or vegetable juice - 1/2 cup has 1 g of protein.  Green beans (raw or cooked) - 1/2 cup has 1 g of protein.  Green peas (canned) - 1/2 cup has 3.5 g of protein.  Muffins - 1 small muffin (2 oz) has 3 g of protein.  Oatmeal (cooked) - 1/2 cup has 3 g of protein.  Potato (baked with skin) - 1 medium potato has 3 g of protein.  Rice (cooked) - 1/2 cup has 2.5-3.5 g of protein.  Sour cream - 1/2 cup has 2.5 g of protein.  Spinach (cooked) - 1/2 cup has 3 g of protein.  Squash (cooked) - 1/2 cup has 1.5 g of protein.  Actual amounts of protein may be different depending on processing. Talk with your health care provider or dietitian about what foods are recommended for you. This information is not intended to replace advice given to you by your health care provider. Make sure you  discuss any questions you have with your health care provider. Document Released: 03/31/2015 Document Revised: 09/10/2015 Document Reviewed: 09/10/2015 Elsevier Interactive Patient Education  [employer].

## 2017-08-13 LAB — COMPREHENSIVE METABOLIC PANEL
A/G RATIO: 1.4 (ref 1.2–2.2)
ALBUMIN: 4.7 g/dL (ref 3.5–4.8)
ALT: 10 IU/L (ref 0–32)
AST: 16 IU/L (ref 0–40)
Alkaline Phosphatase: 65 IU/L (ref 39–117)
BUN / CREAT RATIO: 19 (ref 12–28)
BUN: 40 mg/dL — ABNORMAL HIGH (ref 8–27)
Bilirubin Total: 0.3 mg/dL (ref 0.0–1.2)
CO2: 20 mmol/L (ref 20–29)
Calcium: 10.5 mg/dL — ABNORMAL HIGH (ref 8.7–10.3)
Chloride: 105 mmol/L (ref 96–106)
Creatinine, Ser: 2.11 mg/dL — ABNORMAL HIGH (ref 0.57–1.00)
GFR calc non Af Amer: 23 mL/min/{1.73_m2} — ABNORMAL LOW (ref >59)
GFR, EST AFRICAN AMERICAN: 27 mL/min/{1.73_m2} — AB (ref >59)
GLOBULIN, TOTAL: 3.3 g/dL (ref 1.5–4.5)
Glucose: 202 mg/dL — ABNORMAL HIGH (ref 65–99)
POTASSIUM: 4 mmol/L (ref 3.5–5.2)
Sodium: 144 mmol/L (ref 134–144)
TOTAL PROTEIN: 8 g/dL (ref 6.0–8.5)

## 2017-08-13 LAB — CBC WITH DIFFERENTIAL/PLATELET
BASOS ABS: 0 10*3/uL (ref 0.0–0.2)
BASOS: 1 %
EOS (ABSOLUTE): 0 10*3/uL (ref 0.0–0.4)
Eos: 0 %
HEMOGLOBIN: 11.9 g/dL (ref 11.1–15.9)
Hematocrit: 37.6 % (ref 34.0–46.6)
Immature Grans (Abs): 0 10*3/uL (ref 0.0–0.1)
Immature Granulocytes: 0 %
LYMPHS ABS: 1.4 10*3/uL (ref 0.7–3.1)
Lymphs: 21 %
MCH: 27.7 pg (ref 26.6–33.0)
MCHC: 31.6 g/dL (ref 31.5–35.7)
MCV: 87 fL (ref 79–97)
Monocytes Absolute: 0.4 10*3/uL (ref 0.1–0.9)
Monocytes: 6 %
NEUTROS ABS: 4.6 10*3/uL (ref 1.4–7.0)
Neutrophils: 72 %
Platelets: 305 10*3/uL (ref 150–450)
RBC: 4.3 x10E6/uL (ref 3.77–5.28)
RDW: 13.4 % (ref 12.3–15.4)
WBC: 6.4 10*3/uL (ref 3.4–10.8)

## 2017-08-13 LAB — VITAMIN B12: Vitamin B-12: 536 pg/mL (ref 232–1245)

## 2017-08-13 LAB — HEMOGLOBIN A1C
Est. average glucose Bld gHb Est-mCnc: 126 mg/dL
Hgb A1c MFr Bld: 6 % — ABNORMAL HIGH (ref 4.8–5.6)

## 2017-08-13 LAB — TSH: TSH: 1.38 u[IU]/mL (ref 0.450–4.500)

## 2017-09-08 ENCOUNTER — Other Ambulatory Visit: Payer: Medicare Other | Admitting: Internal Medicine

## 2017-09-08 DIAGNOSIS — R630 Anorexia: Secondary | ICD-10-CM

## 2017-09-08 DIAGNOSIS — R413 Other amnesia: Secondary | ICD-10-CM

## 2017-09-08 NOTE — Progress Notes (Signed)
    PALLIATIVE CARE CONSULT VISIT   PATIENT NAME: Tammy [name] DOB: Feb 09, 1945 MRN: [id number]  PRIMARY CARE PROVIDER:   [name], MD  REFERRING PROVIDER:  [name], MD [place], [place] [address]  RESPONSIBLE PARTY:   Atoya Andrew (Husband)  [phone], [phone] cell    RECOMMENDATIONS and PLAN:   1.  Memory disturbance without behavioral changes.  R41.3 : Some progression.  FAST stage 6b.  Continues use of Aricept. Discussed home and patient safety.  2.  Loss of appetite R63.0:  Gradual improvement when patient is fed per husband's report. Continue Remeron and assist with meals.  Again, encouraged husband to begin  monthly weights.   3.  Advanced Care Planning/counseling  Z71.89:   Husband will continue caregiving at home with assistance of his daughter.   Full scope of treatment and Full Code.  Palliative care will continue to follow.   I spent  20 providing this consultation at home, from 11:30 am to 11:50am at the home. More than 50% of the time in this consultation was spent coordinating communication with pt and husband.   HISTORY OF PRESENT ILLNESS:  Followup with  Tammy [name].  Husband states that she is eating a little better when she is physically fed.  She can feed self but loses interest easily.  She also requires additional assistance with hygienic needs.  She remains continent.  Couple does not own scales for weighing.  No reports of falls.  CODE STATUS: FULL CODE  PPS: 50% HOSPICE ELIGIBILITY/DIAGNOSIS: TBD  PAST MEDICAL HISTORY:  Past Medical History:  Diagnosis Date  . Alzheimer's disease 12/28/2014  . Dementia   . Headache disorder 12/28/2014  . Memory disturbance 12/28/2014  . Pulmonary embolus ([place]) 02/23/2016  . Right femoral vein DVT ([place]) 02/24/2016    SOCIAL HX:  Social History   Tobacco Use  . Smoking status: Never Smoker  . Smokeless tobacco: Never Used  Substance Use Topics  . Alcohol use: No    ALLERGIES:    Allergies  Allergen Reactions  . Sulfa Antibiotics Palpitations     PERTINENT MEDICATIONS:  Outpatient Encounter Medications as of 07/03/2017  Medication Sig  . donepezil (ARICEPT) 5 MG tablet Take 1 tablet (5 mg total) by mouth at bedtime.  . feeding supplement, ENSURE ENLIVE, (ENSURE ENLIVE) LIQD Take 237 mLs by mouth 2 (two) times daily between meals.  . Melatonin 5 MG TABS Take 1 tablet (5 mg total) by mouth at bedtime. About 1-2 hours before bed  . mirtazapine (REMERON) 45 MG tablet Take 1 tablet (45 mg total) by mouth at bedtime.  . rivaroxaban (XARELTO) 20 MG TABS tablet Take 1 tablet (20 mg total) by mouth daily with supper.  . senna-docusate (SENOKOT-S) 8.6-50 MG tablet Take 4 tablets by mouth at bedtime.  . triamterene-hydrochlorothiazide (MAXZIDE-25) 37.5-25 MG tablet Take 0.5 tablets by mouth daily.   No facility-administered encounter medications on file as of 07/03/2017.     PHYSICAL EXAM:   General:  Thin elderly female sitting in home and in NAD. Cardiovascular: regular rate and rhythm Pulmonary: clear in all  fields Abdomen: soft, nontender, + bowel sounds Extremities: no edema or ecchymosis.  Ambulates without assistance. Skin: Exposed skin is intact Neurological: Alert and oriented to person.  Follows commands.  3-4 word sentences. Psych: Pleasant mood.  Calm and cooperative.  Follows husband Gonzella Lex, NP-C

## 2017-09-12 ENCOUNTER — Ambulatory Visit (INDEPENDENT_AMBULATORY_CARE_PROVIDER_SITE_OTHER): Payer: Medicare Other | Admitting: Family Medicine

## 2017-09-12 ENCOUNTER — Encounter: Payer: Self-pay | Admitting: Family Medicine

## 2017-09-12 VITALS — BP 124/85 | HR 100 | Temp 97.9°F | Resp 16 | Ht 66.0 in | Wt 123.4 lb

## 2017-09-12 DIAGNOSIS — R600 Localized edema: Secondary | ICD-10-CM

## 2017-09-12 DIAGNOSIS — G300 Alzheimer's disease with early onset: Secondary | ICD-10-CM | POA: Diagnosis not present

## 2017-09-12 DIAGNOSIS — N179 Acute kidney failure, unspecified: Secondary | ICD-10-CM

## 2017-09-12 DIAGNOSIS — R630 Anorexia: Secondary | ICD-10-CM

## 2017-09-12 DIAGNOSIS — E440 Moderate protein-calorie malnutrition: Secondary | ICD-10-CM

## 2017-09-12 DIAGNOSIS — R634 Abnormal weight loss: Secondary | ICD-10-CM

## 2017-09-12 DIAGNOSIS — F0280 Dementia in other diseases classified elsewhere without behavioral disturbance: Secondary | ICD-10-CM

## 2017-09-12 DIAGNOSIS — I82409 Acute embolism and thrombosis of unspecified deep veins of unspecified lower extremity: Secondary | ICD-10-CM

## 2017-09-12 DIAGNOSIS — Z7689 Persons encountering health services in other specified circumstances: Secondary | ICD-10-CM | POA: Diagnosis not present

## 2017-09-12 MED ORDER — DONEPEZIL HCL 5 MG PO TABS: 5.0000 mg | ORAL_TABLET | Freq: Every day | ORAL | 11 refills | Status: DC

## 2017-09-12 MED ORDER — MEGESTROL ACETATE 40 MG/ML PO SUSP: 800.0000 mg | Freq: Every day | ORAL | 2 refills | Status: DC

## 2017-09-12 NOTE — Patient Instructions (Addendum)
Stop the triamterene-hctz blood pressure medication. You can always restart the 1/2 tab if Tammy [name] starts to develop swelling in her legs.   Decrease the donepezil to 5mg  Take 1 tablespoon and 1 teaspoon of the liquid megestrol medication daily  If you have lab work done today you will be contacted with your lab results within the next 2 weeks.  If you have not heard from [place] then please contact us. The fastest way to get your results is to register for My Chart.   IF you received an x-ray today, you will receive an invoice from [facility]iology. Please contact [facility] Radiology at [phone] with questions or concerns regarding your invoice.   IF you received labwork today, you will receive an invoice from [place]. Please contact [facility] at [phone] with questions or concerns regarding your invoice.   Our billing staff will not be able to assist you with questions regarding bills from these companies.  You will be contacted with the lab results as soon as they are available. The fastest way to get your results is to activate your My Chart account. Instructions are located on the last page of this paperwork. If you have not heard from [place] regarding the results in 2 weeks, please contact this office.     High-Protein and High-Calorie Diet Eating high-protein and high-calorie foods can help you to gain weight, heal after an injury, and recover after an illness or surgery. What is my plan? The specific amount of daily protein and calories you need depends on:  Your body weight.  The reason this diet is recommended for you.  Generally, a high-protein, high-calorie diet involves:  Eating 250-500 extra calories each day.  Making sure that 10-35% of your daily calories come from protein.  Talk to your health care provider about how much protein and how many calories you need each day. Follow the diet as directed by your health care provider. What do I need to know about this  diet?  Ask your health care provider if you should take a nutritional supplement.  Try to eat six small meals each day instead of three large meals.  Eat a balanced diet, including one food that is high in protein at each meal.  Keep nutritious snacks handy, such as nuts, trail mixes, dried fruit, and yogurt.  If you have kidney disease or diabetes, eating too much protein may put extra stress on your kidneys. Talk to your health care provider if you have either of those conditions. What are some high-protein foods? Grains Quinoa. Bulgur wheat. Vegetables Soybeans. Peas. Meats and Other Protein Sources Beef, pork, and poultry. Fish and seafood. Eggs. Tofu. Textured vegetable protein (TVP). Peanut butter. Nuts and seeds. Dried beans. Protein powders. Dairy Whole milk. Whole-milk yogurt. Powdered milk. Cheese. Yahoo. Eggnog. Beverages High-protein supplement drinks. Soy milk. Other Protein bars. The items listed above may not be a complete list of recommended foods or beverages. Contact your dietitian for more options. What are some high-calorie foods? Grains Pasta. Quick breads. Muffins. Pancakes. Ready-to-eat cereal. Vegetables Vegetables cooked in oil or butter. Fried potatoes. Fruits Dried fruit. Fruit leather. Canned fruit in syrup. Fruit juice. Avocados. Meats and Other Protein Sources Peanut butter. Nuts and seeds. Dairy Heavy cream. Whipped cream. Cream cheese. Sour cream. Ice cream. Custard. Pudding. Beverages Meal-replacement beverages. Nutrition shakes. Fruit juice. Sugar-sweetened soft drinks. Condiments Salad dressing. Mayonnaise. Alfredo sauce. Fruit preserves or jelly. Honey. Syrup. Sweets/Desserts Cake. Cookies. Pie. Pastries. Candy bars. Chocolate. Fats and Oils  Butter or margarine. Oil. Gravy. Other Meal-replacement bars. The items listed above may not be a complete list of recommended foods or beverages. Contact your dietitian for more  options. What are some tips for including high-protein and high-calorie foods in my diet?  Add whole milk, half-and-half, or heavy cream to cereal, pudding, soup, or hot cocoa.  Add whole milk to instant breakfast drinks.  Add peanut butter to oatmeal or smoothies.  Add powdered milk to baked goods, smoothies, or milkshakes.  Add powdered milk, cream, or butter to mashed potatoes.  Add cheese to cooked vegetables.  Make whole-milk yogurt parfaits. Top them with granola, fruit, or nuts.  Add cottage cheese to your fruit.  Add avocados, cheese, or both to sandwiches or salads.  Add meat, poultry, or seafood to rice, pasta, casseroles, salads, and soups.  Use mayonnaise when making egg salad, chicken salad, or tuna salad.  Use peanut butter as a topping for pretzels, celery, or crackers.  Add beans to casseroles, dips, and spreads.  Add pureed beans to sauces and soups.  Replace calorie-free drinks with calorie-containing drinks, such as milk and fruit juice. This information is not intended to replace advice given to you by your health care provider. Make sure you discuss any questions you have with your health care provider. Document Released: 12/30/2004 Document Revised: 06/07/2015 Document Reviewed: 06/14/2013 Elsevier Interactive Patient Education  [employer].

## 2017-09-12 NOTE — Progress Notes (Addendum)
Subjective:    Patient ID: Tammy [name], female    DOB: 11/22/1945, 72 y.o.   MRN: [id number] Chief Complaint  Patient presents with  . weight    eating a little more but not much  . medication check    see how is she is doing    Sarah's husband brought her in today for a weight check.  It was difficult to get her to take pills so was wondering if the Megace came in a liquid - most of the times she takes some of the Megace pills, and someitmes all and sometimes none.  A liquid would be easier to take.   Did remember to cut triamterene HCTZ in half and is only giving patient half tab a day.  No recurrence of pedal edema.  Past Medical History:  Diagnosis Date  . Alzheimer's disease 12/28/2014  . Dementia   . Headache disorder 12/28/2014  . Memory disturbance 12/28/2014  . Pulmonary embolus ([place]) 02/23/2016  . Right femoral vein DVT ([place]) 02/24/2016   No past surgical history on file. Current Outpatient Medications on File Prior to Visit  Medication Sig Dispense Refill  . donepezil (ARICEPT) 10 MG tablet TK 1 T PO HS  5  . donepezil (ARICEPT) 5 MG tablet Take 1 tablet (5 mg total) by mouth at bedtime. 30 tablet 11  . feeding supplement, ENSURE ENLIVE, (ENSURE ENLIVE) LIQD Take 237 mLs by mouth 2 (two) times daily between meals. 237 mL 12  . megestrol (MEGACE) 40 MG tablet Take 5 tablets (200 mg total) by mouth 2 (two) times daily. 300 tablet 0  . Melatonin 5 MG TABS Take 1 tablet (5 mg total) by mouth at bedtime. About 1-2 hours before bed  0  . mirtazapine (REMERON) 45 MG tablet Take 1 tablet (45 mg total) by mouth at bedtime. 90 tablet 1  . rivaroxaban (XARELTO) 20 MG TABS tablet Take 1 tablet (20 mg total) by mouth daily with supper. 90 tablet 1  . senna-docusate (SENOKOT-S) 8.6-50 MG tablet Take 4 tablets by mouth at bedtime. 360 tablet 1  . triamterene-hydrochlorothiazide (MAXZIDE-25) 37.5-25 MG tablet Take 0.5 tablets by mouth daily. 45 tablet 0   No current  facility-administered medications on file prior to visit.    Allergies  Allergen Reactions  . Sulfa Antibiotics Palpitations   Family History  Problem Relation Age of Onset  . Heart disease Brother   . Heart attack Mother   . Dementia Mother    Social History   Socioeconomic History  . Marital status: Married    Spouse name: [name]  . Number of children: 3  . Years of education: 83  . Highest education level: Not on file  Occupational History  . Occupation: retired  Scientific laboratory technician  . Financial resource strain: Not on file  . Food insecurity:    Worry: Not on file    Inability: Not on file  . Transportation needs:    Medical: Not on file    Non-medical: Not on file  Tobacco Use  . Smoking status: Never Smoker  . Smokeless tobacco: Never Used  Substance and Sexual Activity  . Alcohol use: No  . Drug use: No  . Sexual activity: Yes  Lifestyle  . Physical activity:    Days per week: Not on file    Minutes per session: Not on file  . Stress: Not on file  Relationships  . Social connections:    Talks on phone: Not on file  Gets together: Not on file    Attends religious service: Not on file    Active member of club or organization: Not on file    Attends meetings of clubs or organizations: Not on file    Relationship status: Not on file  Other Topics Concern  . Not on file  Social History Narrative   Married with 3 children   Right handed   8th grade   1 cup coffee daily   Depression screen [facility] 2/9 08/12/2017 07/01/2017 03/16/2017 03/05/2017 02/23/2017  Decreased Interest 0 0 0 0 0  Down, Depressed, Hopeless 0 0 0 0 0  PHQ - 2 Score 0 0 0 0 0       Review of Systems  Unable to perform ROS: Dementia       Objective:   Physical Exam  Constitutional: She is oriented to person, place, and time. She appears well-developed and well-nourished. No distress.  HENT:  Head: Normocephalic and atraumatic.  Right Ear: External ear normal.  Left Ear: External ear  normal.  Eyes: Conjunctivae are normal. No scleral icterus.  Neck: Normal range of motion. Neck supple. No thyromegaly present.  Cardiovascular: Normal rate, regular rhythm, normal heart sounds and intact distal pulses.  Pulmonary/Chest: Effort normal and breath sounds normal. No respiratory distress.  Musculoskeletal: She exhibits no edema.  Lymphadenopathy:    She has no cervical adenopathy.  Neurological: She is alert and oriented to person, place, and time.  Skin: Skin is warm and dry. She is not diaphoretic. No erythema.  Psychiatric: She has a normal mood and affect. Her behavior is normal.   BP 124/85   Pulse 100   Temp 97.9 F (36.6 C) (Oral)   Resp 16   Ht 5\' 6"  (1.676 m)   Wt 123 lb 6.4 oz (56 kg)   SpO2 99%   BMI 19.92 kg/m      Assessment & Plan:   1. Unintentional weight loss of more than 10 pounds in 90 days -suspect this is due to progression of Alzheimer's but would like to rule out complicating or exacerbating factors.  Do need to check for UTI as high risk for occult infection that is symptomatic but patient unable to report with the pelvic mass (suspected benign, follows annually with gyn), baseline dementia, and dehydration.  However at each prior visit we have been unable to obtain a urine sample from patient in office due to dehydration and dementia.  Entered future orders and sent patient and husband home with collection supplies for urine.  2. Weight loss, unintentional -   3. Acute renal failure, unspecified acute renal failure type ([place]) -I think due to dehydration from poor intake.  Stop triamterene HCTZ.  4. Hypercalcemia -likely from diuretic and dehydration.  Recheck after off of diuretic.  5. Early onset Alzheimer's dementia without behavioral disturbance ([place]) -concern donepezil may be causing nausea so decrease from 10 to 5 mg at night -dementia seems significant enough at this point I am not sure how much it is helping.  They will continue to follow  with neurology periodically.  6. Loss of appetite -maxed out on mirtazapine for many months without any notable benefit but will continue.  Push ensures between meals.  Could not tell if any benefit from Megace pills as it was difficult to get patient to take her husband would like to try liquid -he understands that there is a low likelihood that this would be significantly successful.  Has had palliative care  in the home to discuss ways to get her to eat and drink Continue to be proactive about constipation to ensure that stool back-up is not contributing to decreased appetite so continue Senokot S4 tabs nightly  7. Recurrent deep vein thrombosis (DVT) (H[facility]) -stay on Xarelto  8.      Bilateral edema of lower extremity -resolved on diuretic, try to wean off due to hypotension 9.      Moderate protein-calorie malnutrition -continue pushing protein shakes during meals.  Had palliative care consult at home which tried to help provide suggestions  Orders Placed This Encounter  Procedures  . Urine Culture    Standing Status:   Future    Standing Expiration Date:   09/13/2018  . Comprehensive metabolic panel  . PTH, Intact and Calcium  . Prealbumin  . Microalbumin/Creatinine Ratio, Urine    Standing Status:   Future    Standing Expiration Date:   09/13/2018  . Urinalysis, Routine w reflex microscopic    Standing Status:   Future    Standing Expiration Date:   09/13/2018  . Specimen status report  . Prealbumin  . Specimen status report    Meds ordered this encounter  Medications  . megestrol (MEGACE) 40 MG/ML suspension    Sig: Take 20 mLs (800 mg total) by mouth daily.    Dispense:  600 mL    Refill:  2  . donepezil (ARICEPT) 5 MG tablet    Sig: Take 1 tablet (5 mg total) by mouth at bedtime.    Dispense:  30 tablet    Refill:  11    Delman Cheadle, MD, MPH Primary Care at [facility], [place]  [address] [phone] Office phone  [phone]  Office fax   10/19/17 7:02 AM

## 2017-09-13 LAB — COMPREHENSIVE METABOLIC PANEL
A/G RATIO: 1.5 (ref 1.2–2.2)
ALBUMIN: 4.7 g/dL (ref 3.5–4.8)
ALT: 7 IU/L (ref 0–32)
AST: 14 IU/L (ref 0–40)
Alkaline Phosphatase: 67 IU/L (ref 39–117)
BUN / CREAT RATIO: 18 (ref 12–28)
BUN: 27 mg/dL (ref 8–27)
Bilirubin Total: 0.4 mg/dL (ref 0.0–1.2)
CALCIUM: 10.4 mg/dL — AB (ref 8.7–10.3)
CHLORIDE: 102 mmol/L (ref 96–106)
CO2: 17 mmol/L — ABNORMAL LOW (ref 20–29)
Creatinine, Ser: 1.49 mg/dL — ABNORMAL HIGH (ref 0.57–1.00)
GFR, EST AFRICAN AMERICAN: 40 mL/min/{1.73_m2} — AB (ref >59)
GFR, EST NON AFRICAN AMERICAN: 35 mL/min/{1.73_m2} — AB (ref >59)
Globulin, Total: 3.1 g/dL (ref 1.5–4.5)
Glucose: 92 mg/dL (ref 65–99)
Potassium: 4 mmol/L (ref 3.5–5.2)
Sodium: 142 mmol/L (ref 134–144)
Total Protein: 7.8 g/dL (ref 6.0–8.5)

## 2017-09-13 LAB — SPECIMEN STATUS REPORT

## 2017-09-15 LAB — PTH, INTACT AND CALCIUM: PTH: 44 pg/mL (ref 15–65)

## 2017-09-15 LAB — PREALBUMIN

## 2017-09-16 LAB — PREALBUMIN: PREALBUMIN: 36 mg/dL — AB (ref 9–32)

## 2017-09-16 LAB — SPECIMEN STATUS REPORT

## 2017-09-23 ENCOUNTER — Telehealth: Payer: Self-pay | Admitting: Family Medicine

## 2017-09-28 ENCOUNTER — Other Ambulatory Visit: Payer: Self-pay | Admitting: Family Medicine

## 2017-09-29 NOTE — Telephone Encounter (Signed)
DOK refill Last Refill: 02/24/17 # 30 Last OV: 02/23/17 PCP: Dr Delman Cheadle Pharmacy: [facility]  02/23/17 visit is for 08/18/48 ducusate 4 tablets at bedtime; please clarify

## 2017-10-06 ENCOUNTER — Other Ambulatory Visit: Payer: Self-pay | Admitting: Family Medicine

## 2017-10-06 NOTE — Telephone Encounter (Signed)
Attempted to contact pt regarding refill request for maxzide; this medication was discontinued per Dr Delman Cheadle, [place], 09/12/17; left message for pt to call office on voicemail of [phone]; when pt calls back please see if pt is having signs and symptoms.

## 2017-10-07 ENCOUNTER — Other Ambulatory Visit: Payer: Self-pay

## 2017-10-07 ENCOUNTER — Ambulatory Visit (INDEPENDENT_AMBULATORY_CARE_PROVIDER_SITE_OTHER): Payer: Medicare Other | Admitting: Physician Assistant

## 2017-10-07 ENCOUNTER — Encounter: Payer: Self-pay | Admitting: Physician Assistant

## 2017-10-07 VITALS — BP 130/84 | HR 90 | Temp 98.0°F | Resp 18 | Ht 66.0 in | Wt 124.4 lb

## 2017-10-07 DIAGNOSIS — R944 Abnormal results of kidney function studies: Secondary | ICD-10-CM

## 2017-10-07 DIAGNOSIS — K5900 Constipation, unspecified: Secondary | ICD-10-CM | POA: Diagnosis not present

## 2017-10-07 DIAGNOSIS — R634 Abnormal weight loss: Secondary | ICD-10-CM

## 2017-10-07 MED ORDER — SENNOSIDES-DOCUSATE SODIUM 8.6-50 MG PO TABS: 4.0000 | ORAL_TABLET | Freq: Every day | ORAL | 1 refills | Status: DC

## 2017-10-07 NOTE — Patient Instructions (Addendum)
    Senokot S - take 4 pills at night before bedtime.   For constipation     1) Water: Make sure you are drinking enough water daily - about 1-3 liters. 2) Fiber: Make sure you are getting enough fiber in your diet - this will make you regular - you can eat high fiber foods or use metamucil as a supplement - it is really important to drink enough water when using fiber supplements. Foods that have a lot of fiber include vegetables, fruits, beans, nuts, oatmeal, and some breads and cereals. You can tell how much fiber is in a food by reading the nutrition label. Doctors recommend eating 25 to 36 grams of fiber each day. 3) Fitness: Increasing your physical activity will help increase the natural movement of your bowels. Try to get 20-30 minutes of exercise daily.  4) Use a 6-9" stool in front of your toilet to rest your feet on while you have a bowel movement. This will loosen your rectal muscles to help your stool come out easier and prevent straining.     Another option if Senokot S is not working: Use Miralax 1-2 capfuls a day until your stools are soft and regular and then decrease the usage - you can use this daily  Follow-up with Dr. Brigitte Pulse.   IF you received an x-ray today, you will receive an invoice from [facility] Radiology. Please contact [facility] Radiology at [phone] with questions or concerns regarding your invoice.   IF you received labwork today, you will receive an invoice from [place]. Please contact [facility] at [phone] with questions or concerns regarding your invoice.   Our billing staff will not be able to assist you with questions regarding bills from these companies.  You will be contacted with the lab results as soon as they are available. The fastest way to get your results is to activate your My Chart account. Instructions are located on the last page of this paperwork. If you have not heard from [place] regarding the results in 2 weeks, please contact this  office.

## 2017-10-07 NOTE — Progress Notes (Signed)
Tammy [name]  MRN: [id number] DOB: Oct 11, 1945  PCP: [name], MD  Subjective:  Pt is a 72 year old female who presents to clinic for f/u unintentional weight loss. She is here today with her husband.  She has been seeing Dr. Brigitte Pulse for this problem, last OV 8/31.  Last OV: Stop the triamterene-hctz  Decrease the donepezil to 25m Take 1 tablespoon and 1 teaspoon of liquid megestrol daily Mr. IPoirierstates they have made these changes appropriately.   Wt Readings from Last 3 Encounters:  10/07/17 124 lb 6.4 oz (56.4 kg)  09/12/17 123 lb 6.4 oz (56 kg)  08/12/17 124 lb 3.2 oz (56.3 kg)   She is still not eating like she should. "she forgets to keep eating." Pt eats cereal in the morning and Ensure for lunch. She is not eating much of what husband cooks. Her daughter comes over occasionally and feeds Tammy [name] which helps a lot.    She stays constipated. Have not been taking Senakot s - out of this medication.  Her husband has to help pt use the restroom "usually poop should wind up in the toilet bowel. Hers doesn't leave her butt".  Mr. IWheelwrightbelieves his daughter is feeding the pt Miralax, but is unsure of the dose Pt drinks water throughout the day and gets walks in regularly.   Review of Systems  Constitutional: Positive for appetite change and unexpected weight change. Negative for activity change.  Psychiatric/Behavioral: Positive for behavioral problems and confusion. Negative for dysphoric mood, self-injury and suicidal ideas. The patient is not nervous/anxious.     Patient Active Problem List   Diagnosis Date Noted  . Loss of appetite 07/08/2017  . Advanced care planning/counseling discussion 07/08/2017  . [place] pulmonary [place] ([place] 03/07/2017  . Acute D[place] ([place]) [place] 03/07/2017  . Normocytic anemia 03/07/2017  . History of deep vein thrombosis (DVT) of lower extremity 03/06/2017  . History of pulmonary embolism 03/06/2017  . Unintentional  weight loss of more than 5% body weight within 1 month 02/23/2017  . Essential hypertension 12/31/2016  . Pelvic mass in female 03/11/2016  . Ovarian mass 02/24/2016  . Vitamin D deficiency 01/02/2015  . Prediabetes 01/02/2015  . Memory disturbance 12/28/2014  . Alzheimer's disease 12/28/2014    Current Outpatient Medications on File Prior to Visit  Medication Sig Dispense Refill  . donepezil (ARICEPT) 5 MG tablet Take 1 tablet (5 mg total) by mouth at bedtime. 30 tablet 11  . feeding supplement, ENSURE ENLIVE, (ENSURE ENLIVE) LIQD Take 237 mLs by mouth 2 (two) times daily between meals. 237 mL 12  . megestrol (MEGACE) 40 MG/ML suspension Take 20 mLs (800 mg total) by mouth daily. 600 mL 2  . Melatonin 5 MG TABS Take 1 tablet (5 mg total) by mouth at bedtime. About 1-2 hours before bed  0  . mirtazapine (REMERON) 45 MG tablet Take 1 tablet (45 mg total) by mouth at bedtime. 90 tablet 1  . rivaroxaban (XARELTO) 20 MG TABS tablet Take 1 tablet (20 mg total) by mouth daily with supper. 90 tablet 1  . senna-docusate (SENOKOT-S) 8.6-50 MG tablet Take 4 tablets by mouth at bedtime. 360 tablet 1   No current facility-administered medications on file prior to visit.     Allergies  Allergen Reactions  . Sulfa Antibiotics Palpitations     Objective:  BP 130/84   Pulse 90   Temp 98 F (36.7 C) (Oral)   Resp 18  Ht _0  (1.676 m)   Wt 124 lb 6.4 oz (56.4 kg)   SpO2 97%   BMI 20.08 kg/m   Physical Exam  Constitutional: She is oriented to person, place, and time. No distress.  Cardiovascular: Normal rate, regular rhythm and normal heart sounds.  Neurological: She is alert and oriented to person, place, and time.  Skin: Skin is warm and dry.  Psychiatric: Judgment normal.  Vitals reviewed.   Assessment and Plan :  1. Unintentional weight loss - Pt here for f/u unintentional weight loss, likely complicated by Alzheimer's - which Mr. Loflin understands. Pt is eating small meals  throughout the day.  Weight remains stable at 124, which is up 1 lbs compared to one month ago. F/u with Dr. Brigitte Pulse in 4-6 weeks.  2. Constipation, unspecified constipation type - Advised small stool to rest her feet on during BM. Plan to refill Senakot.  - senna-docusate (SENOKOT-S) 8.6-50 MG tablet; Take 4 tablets by mouth at bedtime.  Dispense: 360 tablet; Refill: 1  3. Decreased GFR - Recheck kidney function.  - CMP14+EGFR   Mercer Pod, PA-C  Primary Care at [place] 10/07/2017 10:14 AM  Please note: Portions of this report may have been transcribed using dragon voice recognition software. Every effort was made to ensure accuracy; however, inadvertent computerized transcription errors may be present.

## 2017-10-08 ENCOUNTER — Other Ambulatory Visit: Payer: Self-pay | Admitting: Family Medicine

## 2017-10-08 LAB — CMP14+EGFR
ALT: 9 IU/L (ref 0–32)
AST: 11 IU/L (ref 0–40)
Albumin/Globulin Ratio: 1.6 (ref 1.2–2.2)
Albumin: 4.6 g/dL (ref 3.5–4.8)
Alkaline Phosphatase: 50 IU/L (ref 39–117)
BUN/Creatinine Ratio: 20 (ref 12–28)
BUN: 31 mg/dL — ABNORMAL HIGH (ref 8–27)
Bilirubin Total: 0.3 mg/dL (ref 0.0–1.2)
CO2: 20 mmol/L (ref 20–29)
Calcium: 10.2 mg/dL (ref 8.7–10.3)
Chloride: 107 mmol/L — ABNORMAL HIGH (ref 96–106)
Creatinine, Ser: 1.53 mg/dL — ABNORMAL HIGH (ref 0.57–1.00)
GFR calc Af Amer: 39 mL/min/{1.73_m2} — ABNORMAL LOW (ref >59)
GFR calc non Af Amer: 34 mL/min/{1.73_m2} — ABNORMAL LOW (ref >59)
Globulin, Total: 2.8 g/dL (ref 1.5–4.5)
Glucose: 88 mg/dL (ref 65–99)
Potassium: 4.4 mmol/L (ref 3.5–5.2)
Sodium: 142 mmol/L (ref 134–144)
Total Protein: 7.4 g/dL (ref 6.0–8.5)

## 2017-10-14 ENCOUNTER — Ambulatory Visit: Payer: Medicare Other | Admitting: Family Medicine

## 2017-10-19 DIAGNOSIS — I82409 Acute embolism and thrombosis of unspecified deep veins of unspecified lower extremity: Secondary | ICD-10-CM | POA: Insufficient documentation

## 2017-10-19 DIAGNOSIS — R634 Abnormal weight loss: Secondary | ICD-10-CM | POA: Insufficient documentation

## 2017-10-20 ENCOUNTER — Encounter: Payer: Self-pay | Admitting: Physician Assistant

## 2017-11-05 NOTE — Telephone Encounter (Signed)
DONE

## 2017-11-10 ENCOUNTER — Telehealth: Payer: Self-pay

## 2017-11-10 NOTE — Telephone Encounter (Signed)
VM left to make patient/family aware that Enid Derry NP is out of the office, Offered to reschedule visit with Enid Derry NP when she returns or with a different NP

## 2017-11-10 NOTE — Telephone Encounter (Signed)
Phone call placed to patient's caregiver to make them aware the primary NP, Enid Derry,  is out of the office and to offer to reschedule with another NP or with Enid Derry NP for when she returns. VM left

## 2017-11-13 ENCOUNTER — Ambulatory Visit (INDEPENDENT_AMBULATORY_CARE_PROVIDER_SITE_OTHER): Payer: Medicare Other | Admitting: Family Medicine

## 2017-11-13 ENCOUNTER — Encounter: Payer: Self-pay | Admitting: Family Medicine

## 2017-11-13 ENCOUNTER — Other Ambulatory Visit: Payer: Self-pay | Admitting: Family Medicine

## 2017-11-13 VITALS — BP 148/100 | HR 99 | Temp 98.1°F | Resp 16

## 2017-11-13 DIAGNOSIS — I10 Essential (primary) hypertension: Secondary | ICD-10-CM | POA: Diagnosis not present

## 2017-11-13 DIAGNOSIS — R600 Localized edema: Secondary | ICD-10-CM

## 2017-11-13 MED ORDER — FUROSEMIDE 20 MG PO TABS: 20.0000 mg | ORAL_TABLET | Freq: Every day | ORAL | 0 refills | Status: DC

## 2017-11-13 NOTE — Patient Instructions (Addendum)
  Please check BP at home once a day every morning, bring readings at next visit   If you have lab work done today you will be contacted with your lab results within the next 2 weeks.  If you have not heard from [place] then please contact us. The fastest way to get your results is to register for My Chart.   IF you received an x-ray today, you will receive an invoice from [facility]. Please contact [facility] Radiology at [phone] with questions or concerns regarding your invoice.   IF you received labwork today, you will receive an invoice from [place]. Please contact [facility] at [phone] with questions or concerns regarding your invoice.   Our billing staff will not be able to assist you with questions regarding bills from these companies.  You will be contacted with the lab results as soon as they are available. The fastest way to get your results is to activate your My Chart account. Instructions are located on the last page of this paperwork. If you have not heard from [place] regarding the results in 2 weeks, please contact this office.

## 2017-11-13 NOTE — Progress Notes (Signed)
11/1/201911:38 AM  Tammy [name] 1945/09/01, 72 y.o. female [id number]  Chief Complaint  Patient presents with  . Foot Swelling    bilateral foot swelling for awhile now; swelling is more in the ankle area    HPI:   Patient is a 72 y.o. 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 No No No No No     Depression screen [facility] 2/9 08/12/2017 07/01/2017 03/16/2017  Decreased Interest 0 0 0  Down, Depressed, Hopeless 0 0 0  PHQ - 2 Score 0 0 0    Allergies  Allergen Reactions  . Sulfa Antibiotics Palpitations    Prior to Admission medications   Medication Sig Start Date End Date Taking? Authorizing Provider  donepezil (ARICEPT) 5 MG tablet Take 1 tablet (5 mg total) by mouth at bedtime. 09/12/17  Yes [name], MD  feeding supplement, ENSURE ENLIVE, (ENSURE ENLIVE) LIQD Take 237 mLs by mouth 2 (two) times daily between meals. 03/08/17  Yes Hongalgi, Lenis Dickinson, MD  megestrol (MEGACE) 40 MG/ML suspension Take 20 mLs (800 mg total) by mouth daily. 09/12/17  Yes [name], MD  Melatonin 5 MG TABS Take 1 tablet (5 mg total) by mouth at bedtime. About 1-2 hours before bed 04/30/17  Yes [name], MD  mirtazapine (REMERON) 45 MG tablet Take 1 tablet (45 mg total) by mouth at bedtime. 04/30/17  Yes [name], MD  rivaroxaban (XARELTO) 20 MG TABS tablet Take 1 tablet (20 mg total) by mouth daily with supper. 04/15/17  Yes [name], MD  senna-docusate (SENOKOT-S) 8.6-50 MG tablet Take 4 tablets by mouth at bedtime. 10/07/17  Yes McVey, Gelene Mink,  PA-C    Past Medical History:  Diagnosis Date  . Alzheimer's disease ([place]) 12/28/2014  . Dementia ([place])   . Headache disorder 12/28/2014  . Memory disturbance 12/28/2014  . Pulmonary embolus ([place]) 02/23/2016  . Right femoral vein DVT ([place]) 02/24/2016    No past surgical history on file.  Social History   Tobacco Use  . Smoking status: Never Smoker  . Smokeless tobacco: Never Used  Substance Use Topics  . Alcohol use: No    Family History  Problem Relation Age of Onset  . Heart disease Brother   . Heart attack Mother   . Dementia Mother     Review of Systems  Constitutional: Negative for chills and fever.  Respiratory: Negative for cough and shortness of breath.   Cardiovascular: Positive for leg swelling. Negative for chest pain, palpitations, orthopnea and PND.  Gastrointestinal: Negative for abdominal pain, nausea and vomiting.    OBJECTIVE:  Blood pressure (!) 161/106, pulse 99, temperature 98.1 F (36.7 C), temperature source Oral, resp. rate 16, SpO2 100 %. There is no height or weight on file to calculate BMI.   Repeat BP 148/100  Wt Readings from Last 3 Encounters:  10/07/17 124 lb 6.4 oz (56.4 kg)  09/12/17 123 lb 6.4 oz (56 kg)  08/12/17 124 lb 3.2 oz (56.3 kg)  BP Readings from Last 3 Encounters:  11/13/17 (!) 161/106  10/07/17 130/84  09/12/17 124/85    Physical Exam  Constitutional: She is oriented to person, place, and time. She appears well-developed and well-nourished.  HENT:  Head: Normocephalic and atraumatic.  Mouth/Throat: Oropharynx is clear and moist. No oropharyngeal exudate.  Eyes: Pupils are equal, round, and reactive to light. Conjunctivae and EOM are normal. No scleral icterus.  Neck: Neck supple.  Cardiovascular: Normal rate, regular rhythm and normal heart sounds. Exam reveals no gallop and no friction rub.  No murmur heard. Pulmonary/Chest: Effort normal and breath sounds normal. She has no wheezes. She has no rales.    Musculoskeletal: She exhibits edema (pitting + 1 upto mid shin, bilaterally, no erythema, TTP, warmth, palpable cords, neg Homans).  Neurological: She is alert and oriented to person, place, and time.  Skin: Skin is warm and dry.  Psychiatric: She has a normal mood and affect.  Nursing note and vitals reviewed.  Echo in 2018: LVEF 60-65%, DD grade I, no sign valvular disease noted  ASSESSMENT and PLAN  1. Bilateral leg edema BP elevated and edema, otherwise no symptoms. Discussed trial of lasix. New med r/se/d reviewed. Checking labs per below. Discussed home BP monitoring. RTC precautions reviewed.  - CMP14+EGFR - CBC - TSH  2. Essential hypertension, benign - CMP14+EGFR - Care order/instruction: - CBC - TSH  Other orders - furosemide (LASIX) 20 MG tablet; Take 1 tablet (20 mg total) by mouth daily.   Return in about 1 week (around 11/20/2017).    Rutherford Guys, MD Primary Care at [place], [place] [address] Ph.  [phone] Fax [phone]

## 2017-11-14 LAB — CMP14+EGFR
ALT: 14 IU/L (ref 0–32)
AST: 16 IU/L (ref 0–40)
Albumin/Globulin Ratio: 1.2 (ref 1.2–2.2)
Albumin: 3.9 g/dL (ref 3.5–4.8)
Alkaline Phosphatase: 60 IU/L (ref 39–117)
BUN/Creatinine Ratio: 21 (ref 12–28)
BUN: 21 mg/dL (ref 8–27)
Bilirubin Total: 0.3 mg/dL (ref 0.0–1.2)
CO2: 19 mmol/L — ABNORMAL LOW (ref 20–29)
Calcium: 9.9 mg/dL (ref 8.7–10.3)
Chloride: 111 mmol/L — ABNORMAL HIGH (ref 96–106)
Creatinine, Ser: 0.98 mg/dL (ref 0.57–1.00)
GFR calc Af Amer: 67 mL/min/{1.73_m2} (ref >59)
GFR calc non Af Amer: 58 mL/min/{1.73_m2} — ABNORMAL LOW (ref >59)
Globulin, Total: 3.3 g/dL (ref 1.5–4.5)
Glucose: 84 mg/dL (ref 65–99)
Potassium: 3.5 mmol/L (ref 3.5–5.2)
Sodium: 148 mmol/L — ABNORMAL HIGH (ref 134–144)
Total Protein: 7.2 g/dL (ref 6.0–8.5)

## 2017-11-14 LAB — CBC
Hematocrit: 30.1 % — ABNORMAL LOW (ref 34.0–46.6)
Hemoglobin: 9.6 g/dL — ABNORMAL LOW (ref 11.1–15.9)
MCH: 27.7 pg (ref 26.6–33.0)
MCHC: 31.9 g/dL (ref 31.5–35.7)
MCV: 87 fL (ref 79–97)
Platelets: 479 10*3/uL — ABNORMAL HIGH (ref 150–450)
RBC: 3.47 x10E6/uL — ABNORMAL LOW (ref 3.77–5.28)
RDW: 13.1 % (ref 12.3–15.4)
WBC: 7.1 10*3/uL (ref 3.4–10.8)

## 2017-11-14 LAB — TSH: TSH: 1.4 u[IU]/mL (ref 0.450–4.500)

## 2017-11-20 ENCOUNTER — Encounter: Payer: Self-pay | Admitting: Family Medicine

## 2017-11-20 ENCOUNTER — Ambulatory Visit (INDEPENDENT_AMBULATORY_CARE_PROVIDER_SITE_OTHER): Payer: Medicare Other | Admitting: Family Medicine

## 2017-11-20 VITALS — BP 159/109 | HR 100 | Temp 98.0°F | Resp 16 | Ht 66.0 in | Wt 124.0 lb

## 2017-11-20 DIAGNOSIS — I10 Essential (primary) hypertension: Secondary | ICD-10-CM | POA: Diagnosis not present

## 2017-11-20 DIAGNOSIS — Z23 Encounter for immunization: Secondary | ICD-10-CM

## 2017-11-20 DIAGNOSIS — R600 Localized edema: Secondary | ICD-10-CM

## 2017-11-20 DIAGNOSIS — G300 Alzheimer's disease with early onset: Secondary | ICD-10-CM

## 2017-11-20 DIAGNOSIS — D649 Anemia, unspecified: Secondary | ICD-10-CM | POA: Diagnosis not present

## 2017-11-20 DIAGNOSIS — R634 Abnormal weight loss: Secondary | ICD-10-CM

## 2017-11-20 DIAGNOSIS — F0280 Dementia in other diseases classified elsewhere without behavioral disturbance: Secondary | ICD-10-CM

## 2017-11-20 MED ORDER — LISINOPRIL-HYDROCHLOROTHIAZIDE 10-12.5 MG PO TABS: 1.0000 | ORAL_TABLET | Freq: Every day | ORAL | 3 refills | Status: DC

## 2017-11-20 NOTE — Patient Instructions (Signed)
° ° ° °  If you have lab work done today you will be contacted with your lab results within the next 2 weeks.  If you have not heard from us then please contact us. The fastest way to get your results is to register for My Chart. ° ° °IF you received an x-ray today, you will receive an invoice from [facility]. Please contact [facility] at [phone] with questions or concerns regarding your invoice.  ° °IF you received labwork today, you will receive an invoice from [facility]. Please contact [facility] at [phone] with questions or concerns regarding your invoice.  ° °Our billing staff will not be able to assist you with questions regarding bills from these companies. ° °You will be contacted with the lab results as soon as they are available. The fastest way to get your results is to activate your My Chart account. Instructions are located on the last page of this paperwork. If you have not heard from us regarding the results in 2 weeks, please contact this office. °  ° ° ° °

## 2017-11-20 NOTE — Progress Notes (Signed)
11/8/20191:46 PM  Tammy [name] March 26, 1945, 72 y.o. female [id number]  Chief Complaint  Patient presents with  . Leg Swelling    follow up/ pt legs are still a little swollen    HPI:   Patient is a 72 y.o. 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, CKD3, weight loss who presents today for followup for feet swelling  History provided by husband Started on lasix  20mg  last visit Has been on it for the past week No difference in her swelling or urination Home BP readings 108-138/72-92 Husband denies any changes in behavior or her c/o of any new issues No really able to elevate her legs, forgets Husband has not noticed any blood in stool or urine, denies melena Her appetite is better, no vomiting  Lab Results  Component Value Date   WBC 7.1 11/13/2017   HGB 9.6 (L) 11/13/2017   HCT 30.1 (L) 11/13/2017   MCV 87 11/13/2017   PLT 479 (H) 11/13/2017   Lab Results  Component Value Date   CREATININE 0.98 11/13/2017   Lab Results  Component Value Date   NA 148 (H) 11/13/2017   K 3.5 11/13/2017   CL 111 (H) 11/13/2017   CO2 19 (L) 11/13/2017    Fall Risk  11/20/2017 09/12/2017 08/12/2017 07/01/2017 04/30/2017  Falls in the past year? 0 No No No No     Depression screen [facility] 2/9 11/20/2017 08/12/2017 07/01/2017  Decreased Interest 0 0 0  Down, Depressed, Hopeless 0 0 0  PHQ - 2 Score 0 0 0    Allergies  Allergen Reactions  . Sulfa Antibiotics Palpitations    Prior to Admission medications   Medication Sig Start Date End Date Taking? Authorizing Provider  donepezil (ARICEPT) 5 MG tablet Take 1 tablet (5 mg total) by mouth at bedtime. 09/12/17  Yes [name], MD  feeding supplement, ENSURE ENLIVE, (ENSURE ENLIVE) LIQD Take 237 mLs by mouth 2 (two) times daily between meals. 03/08/17  Yes Hongalgi, Lenis Dickinson, MD  furosemide (LASIX) 20 MG tablet Take 1 tablet (20 mg total) by mouth daily. 11/13/17  Yes Rutherford Guys, MD  megestrol  (MEGACE) 40 MG/ML suspension Take 20 mLs (800 mg total) by mouth daily. 09/12/17  Yes [name], MD  Melatonin 5 MG TABS Take 1 tablet (5 mg total) by mouth at bedtime. About 1-2 hours before bed 04/30/17  Yes [name], MD  mirtazapine (REMERON) 45 MG tablet Take 1 tablet (45 mg total) by mouth at bedtime. 04/30/17  Yes [name], MD  rivaroxaban (XARELTO) 20 MG TABS tablet Take 1 tablet (20 mg total) by mouth daily with supper. 04/15/17  Yes [name], MD  senna-docusate (SENOKOT-S) 8.6-50 MG tablet Take 4 tablets by mouth at bedtime. 10/07/17  Yes McVey, Gelene Mink, PA-C    Past Medical History:  Diagnosis Date  . Alzheimer's disease ([place]) 12/28/2014  . Dementia ([place])   . Headache disorder 12/28/2014  . Memory disturbance 12/28/2014  . Pulmonary embolus ([place]) 02/23/2016  . Right femoral vein DVT ([place]) 02/24/2016    History reviewed. No pertinent surgical history.  Social History   Tobacco Use  . Smoking status: Never Smoker  . Smokeless tobacco: Never Used  Substance Use Topics  . Alcohol use: No    Family History  Problem Relation Age of Onset  . Heart disease Brother   . Heart attack Mother   . Dementia Mother  Review of Systems  Constitutional: Negative for chills and fever.  Respiratory: Negative for cough and shortness of breath.   Cardiovascular: Negative for chest pain, palpitations and leg swelling.  Gastrointestinal: Negative for abdominal pain, nausea and vomiting.     OBJECTIVE:  Blood pressure (!) 159/109, pulse 100, temperature 98 F (36.7 C), temperature source Oral, resp. rate 16, height 5\' 6"  (1.676 m), weight 124 lb (56.2 kg), SpO2 100 %. Body mass index is 20.01 kg/m.   Repeat BP 148/80  Wt Readings from Last 3 Encounters:  11/20/17 124 lb (56.2 kg)  10/07/17 124 lb 6.4 oz (56.4 kg)  09/12/17 123 lb 6.4 oz (56 kg)    Physical Exam  Constitutional: She is oriented to person, place, and time. She appears well-developed and  well-nourished.  HENT:  Head: Normocephalic and atraumatic.  Mouth/Throat: Oropharynx is clear and moist. No oropharyngeal exudate.  Eyes: Pupils are equal, round, and reactive to light. Conjunctivae and EOM are normal. No scleral icterus.  Neck: Neck supple.  Cardiovascular: Normal rate, regular rhythm and normal heart sounds. Exam reveals no gallop and no friction rub.  No murmur heard. Pulmonary/Chest: Effort normal and breath sounds normal. She has no wheezes. She has no rales.  Musculoskeletal: She exhibits edema.  Neurological: She is alert and oriented to person, place, and time.  Skin: Skin is warm and dry.  Psychiatric: She has a normal mood and affect.  Nursing note and vitals reviewed.   ASSESSMENT and PLAN  1. Anemia, unspecified type - CBC with Differential/Platelet - Iron, TIBC and Ferritin Panel - Vitamin B12 - Reticulocytes - Folate - Pathologist smear review  2. Essential hypertension, benign Remains elevated. Stop lasix. Adding low dose lisinopril-hctz. Cont checking bp at home. Bring to next visit - Basic Metabolic Panel; Future  3. Bilateral leg edema Not better with lasix. Use ace wraps. Cont to encourage elevation as much as possible - Apply ace wrap  4. Unintentional weight loss Stable. Appetite improved  5. Early onset Alzheimer's dementia without behavioral disturbance (HCC) Stable. Cont current regime  Other orders - Pneumococcal polysaccharide vaccine 23-valent greater than or equal to 2yo subcutaneous/IM - lisinopril-hydrochlorothiazide (PRINZIDE,ZESTORETIC) 10-12.5 MG tablet; Take 1 tablet by mouth daily.  Return in about 4 weeks (around 12/18/2017) for leg edema, anemia, HTN.    Rutherford Guys, MD Primary Care at [place], [place] [address] Ph.  [phone] Fax [phone]

## 2017-11-21 LAB — CBC WITH DIFFERENTIAL/PLATELET
Basophils Absolute: 0.1 10*3/uL (ref 0.0–0.2)
Basos: 1 %
EOS (ABSOLUTE): 0 10*3/uL (ref 0.0–0.4)
Eos: 0 %
Hematocrit: 31.3 % — ABNORMAL LOW (ref 34.0–46.6)
Hemoglobin: 10.2 g/dL — ABNORMAL LOW (ref 11.1–15.9)
Immature Grans (Abs): 0 10*3/uL (ref 0.0–0.1)
Immature Granulocytes: 0 %
Lymphocytes Absolute: 1.5 10*3/uL (ref 0.7–3.1)
Lymphs: 22 %
MCH: 28.3 pg (ref 26.6–33.0)
MCHC: 32.6 g/dL (ref 31.5–35.7)
MCV: 87 fL (ref 79–97)
Monocytes Absolute: 0.6 10*3/uL (ref 0.1–0.9)
Monocytes: 9 %
Neutrophils Absolute: 4.5 10*3/uL (ref 1.4–7.0)
Neutrophils: 68 %
Platelets: 367 10*3/uL (ref 150–450)
RBC: 3.6 x10E6/uL — ABNORMAL LOW (ref 3.77–5.28)
RDW: 12.9 % (ref 12.3–15.4)
WBC: 6.7 10*3/uL (ref 3.4–10.8)

## 2017-11-21 LAB — IRON,TIBC AND FERRITIN PANEL
Ferritin: 300 ng/mL — ABNORMAL HIGH (ref 15–150)
Iron Saturation: 21 % (ref 15–55)
Iron: 55 ug/dL (ref 27–139)
Total Iron Binding Capacity: 260 ug/dL (ref 250–450)
UIBC: 205 ug/dL (ref 118–369)

## 2017-11-21 LAB — FOLATE: Folate: >20 ng/mL (ref >3.0)

## 2017-11-21 LAB — VITAMIN B12: Vitamin B-12: 395 pg/mL (ref 232–1245)

## 2017-11-21 LAB — RETICULOCYTES: Retic Ct Pct: 1.4 % (ref 0.6–2.6)

## 2017-11-26 LAB — PATHOLOGIST SMEAR REVIEW
Basophils Absolute: 0.1 10*3/uL (ref 0.0–0.2)
Basos: 1 %
EOS (ABSOLUTE): 0 10*3/uL (ref 0.0–0.4)
Eos: 0 %
Hematocrit: 31.2 % — ABNORMAL LOW (ref 34.0–46.6)
Hemoglobin: 10.1 g/dL — ABNORMAL LOW (ref 11.1–15.9)
Immature Grans (Abs): 0 10*3/uL (ref 0.0–0.1)
Immature Granulocytes: 0 %
Lymphocytes Absolute: 1.6 10*3/uL (ref 0.7–3.1)
Lymphs: 23 %
MCH: 28.4 pg (ref 26.6–33.0)
MCHC: 32.4 g/dL (ref 31.5–35.7)
MCV: 88 fL (ref 79–97)
Monocytes Absolute: 0.6 10*3/uL (ref 0.1–0.9)
Monocytes: 8 %
Neutrophils Absolute: 4.7 10*3/uL (ref 1.4–7.0)
Neutrophils: 68 %
Path Rev PLTs: NORMAL
Path Rev WBC: NORMAL
Platelets: 357 10*3/uL (ref 150–450)
RBC: 3.56 x10E6/uL — ABNORMAL LOW (ref 3.77–5.28)
RDW: 13 % (ref 12.3–15.4)
WBC: 7 10*3/uL (ref 3.4–10.8)

## 2017-12-17 ENCOUNTER — Other Ambulatory Visit: Payer: Self-pay | Admitting: Family Medicine

## 2017-12-18 ENCOUNTER — Other Ambulatory Visit: Payer: Self-pay

## 2017-12-18 ENCOUNTER — Ambulatory Visit (INDEPENDENT_AMBULATORY_CARE_PROVIDER_SITE_OTHER): Payer: Medicare Other | Admitting: Family Medicine

## 2017-12-18 ENCOUNTER — Encounter: Payer: Self-pay | Admitting: Family Medicine

## 2017-12-18 VITALS — BP 130/84 | HR 85

## 2017-12-18 DIAGNOSIS — I10 Essential (primary) hypertension: Secondary | ICD-10-CM

## 2017-12-18 DIAGNOSIS — G309 Alzheimer's disease, unspecified: Secondary | ICD-10-CM | POA: Diagnosis not present

## 2017-12-18 DIAGNOSIS — R600 Localized edema: Secondary | ICD-10-CM | POA: Diagnosis not present

## 2017-12-18 DIAGNOSIS — D638 Anemia in other chronic diseases classified elsewhere: Secondary | ICD-10-CM | POA: Diagnosis not present

## 2017-12-18 DIAGNOSIS — F0280 Dementia in other diseases classified elsewhere without behavioral disturbance: Secondary | ICD-10-CM

## 2017-12-18 NOTE — Progress Notes (Signed)
12/6/20192:31 PM  Tammy [name] 23-Oct-1945, 72 y.o. female [id number]  Chief Complaint  Patient presents with  . Leg Swelling    still having some swelling in the leg, however doing alot better than before  . Anemia  . Hypertension    HPI:   Patient is a 72 y.o. 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, CKD3 who presents today for followup forfeet swelling  Comes in with her husband who provides history Last OV ace warps applied, swelling resolved He has not had to wrap legs since then Does minimal elevation Has not been checking BP at home Appetite is doing well, no concerns regarding recent weight loss Sleeping ok Overall doing well, no acute concerns today  Lab Results  Component Value Date   WBC 7.0 11/20/2017   HGB 10.1 (L) 11/20/2017   HCT 31.2 (L) 11/20/2017   MCV 88 11/20/2017   PLT 357 11/20/2017   Lab Results  Component Value Date   IRON 55 11/20/2017   TIBC 260 11/20/2017   FERRITIN 300 (H) 11/20/2017    Fall Risk  12/18/2017 11/20/2017 09/12/2017 08/12/2017 07/01/2017  Falls in the past year? 0 0 No No No     Depression screen [facility] 2/9 12/18/2017 11/20/2017 08/12/2017  Decreased Interest 0 0 0  Down, Depressed, Hopeless 0 0 0  PHQ - 2 Score 0 0 0    Allergies  Allergen Reactions  . Sulfa Antibiotics Palpitations    Prior to Admission medications   Medication Sig Start Date End Date Taking? Authorizing Provider  donepezil (ARICEPT) 5 MG tablet Take 1 tablet (5 mg total) by mouth at bedtime. 09/12/17  Yes [name], MD  feeding supplement, ENSURE ENLIVE, (ENSURE ENLIVE) LIQD Take 237 mLs by mouth 2 (two) times daily between meals. 03/08/17  Yes Hongalgi, Lenis Dickinson, MD  lisinopril-hydrochlorothiazide (PRINZIDE,ZESTORETIC) 10-12.5 MG tablet Take 1 tablet by mouth daily. 11/20/17  Yes Rutherford Guys, MD  megestrol (MEGACE) 40 MG/ML suspension Take 20 mLs (800 mg total) by mouth daily. 09/12/17  Yes [name],  MD  Melatonin 5 MG TABS Take 1 tablet (5 mg total) by mouth at bedtime. About 1-2 hours before bed 04/30/17  Yes [name], MD  mirtazapine (REMERON) 45 MG tablet Take 1 tablet (45 mg total) by mouth at bedtime. 04/30/17  Yes [name], MD  rivaroxaban (XARELTO) 20 MG TABS tablet Take 1 tablet (20 mg total) by mouth daily with supper. 04/15/17  Yes [name], MD  senna-docusate (SENOKOT-S) 8.6-50 MG tablet Take 4 tablets by mouth at bedtime. 10/07/17  Yes McVey, Gelene Mink, PA-C    Past Medical History:  Diagnosis Date  . Alzheimer's disease ([place]) 12/28/2014  . Dementia ([place])   . Headache disorder 12/28/2014  . Memory disturbance 12/28/2014  . Pulmonary embolus ([place]) 02/23/2016  . Right femoral vein DVT ([place]) 02/24/2016    History reviewed. No pertinent surgical history.  Social History   Tobacco Use  . Smoking status: Never Smoker  . Smokeless tobacco: Never Used  Substance Use Topics  . Alcohol use: No    Family History  Problem Relation Age of Onset  . Heart disease Brother   . Heart attack Mother   . Dementia Mother     Review of Systems  Constitutional: Negative for chills and fever.  Respiratory: Negative for cough and shortness of breath.   Cardiovascular: Negative for chest pain, palpitations and leg swelling.  Gastrointestinal: Negative for abdominal pain, nausea and vomiting.     OBJECTIVE:  Blood pressure 130/84, pulse 85, SpO2 91 %. There is no height or weight on file to calculate BMI.  Declines all other vital signs  Physical Exam  Constitutional: She appears well-developed and well-nourished.  HENT:  Head: Normocephalic and atraumatic.  Mouth/Throat: Oropharynx is clear and moist. No oropharyngeal exudate.  Eyes: Pupils are equal, round, and reactive to light. Conjunctivae and EOM are normal. No scleral icterus.  Neck: Neck supple.  Cardiovascular: Normal rate, regular rhythm and normal heart sounds. Exam reveals no gallop and no friction  rub.  No murmur heard. Pulmonary/Chest: Effort normal and breath sounds normal. She has no wheezes. She has no rales.  Musculoskeletal: She exhibits no edema.  Neurological: She is alert.  Skin: Skin is warm and dry.  Psychiatric: She has a normal mood and affect. Her speech is delayed. Cognition and memory are impaired.  Nursing note and vitals reviewed.     ASSESSMENT and PLAN  1. Essential hypertension, benign Controlled. Continue current regime.  - Basic Metabolic Panel  2. Bilateral leg edema Resolved  3. Alzheimer's dementia without behavioral disturbance, unspecified timing of dementia onset ([place]) Stable. Continue current regime.   4. Anemia of chronic disease stable   Return in about 4 months (around 04/19/2018).    Rutherford Guys, MD Primary Care at [place], [place] [address] Ph.  [phone] Fax [phone]

## 2017-12-18 NOTE — Patient Instructions (Signed)
° ° ° °  If you have lab work done today you will be contacted with your lab results within the next 2 weeks.  If you have not heard from us then please contact us. The fastest way to get your results is to register for My Chart. ° ° °IF you received an x-ray today, you will receive an invoice from [facility]. Please contact [facility] at [phone] with questions or concerns regarding your invoice.  ° °IF you received labwork today, you will receive an invoice from [facility]. Please contact [facility] at [phone] with questions or concerns regarding your invoice.  ° °Our billing staff will not be able to assist you with questions regarding bills from these companies. ° °You will be contacted with the lab results as soon as they are available. The fastest way to get your results is to activate your My Chart account. Instructions are located on the last page of this paperwork. If you have not heard from us regarding the results in 2 weeks, please contact this office. °  ° ° ° °

## 2017-12-19 LAB — BASIC METABOLIC PANEL
BUN/Creatinine Ratio: 30 — ABNORMAL HIGH (ref 12–28)
BUN: 43 mg/dL — ABNORMAL HIGH (ref 8–27)
CO2: 19 mmol/L — ABNORMAL LOW (ref 20–29)
Calcium: 9.6 mg/dL (ref 8.7–10.3)
Chloride: 109 mmol/L — ABNORMAL HIGH (ref 96–106)
Creatinine, Ser: 1.41 mg/dL — ABNORMAL HIGH (ref 0.57–1.00)
GFR calc Af Amer: 43 mL/min/{1.73_m2} — ABNORMAL LOW (ref >59)
GFR calc non Af Amer: 38 mL/min/{1.73_m2} — ABNORMAL LOW (ref >59)
Glucose: 94 mg/dL (ref 65–99)
Potassium: 4.1 mmol/L (ref 3.5–5.2)
Sodium: 142 mmol/L (ref 134–144)

## 2018-01-14 ENCOUNTER — Other Ambulatory Visit: Payer: Self-pay | Admitting: Family Medicine

## 2018-01-14 DIAGNOSIS — R630 Anorexia: Secondary | ICD-10-CM

## 2018-01-14 DIAGNOSIS — G300 Alzheimer's disease with early onset: Principal | ICD-10-CM

## 2018-01-14 DIAGNOSIS — F0280 Dementia in other diseases classified elsewhere without behavioral disturbance: Secondary | ICD-10-CM

## 2018-01-14 NOTE — Telephone Encounter (Signed)
Requested Prescriptions  Pending Prescriptions Disp Refills  . megestrol (MEGACE) 40 MG/ML suspension [Pharmacy Med Name: MEGESTROL ACETATE 40MG /ML SUSP] 600 mL 2    Sig: SHAKE LIQUID AND TAKE 20 ML(800 MG) BY MOUTH DAILY     OB/GYN:  Progestins Passed - 01/14/2018  3:47 AM      Passed - Valid encounter within last 12 months    Recent Outpatient Visits          3 weeks ago Essential hypertension, benign   [facility] at [facility], Lilia Argue, MD   1 month ago Anemia, unspecified type   [facility] at [facility], Lilia Argue, MD   2 months ago Bilateral leg edema   [facility] at [facility], Lilia Argue, MD   3 months ago Unintentional weight loss   Primary Care at [facility], Gelene Mink, PA-C   4 months ago Unintentional weight loss of more than 10 pounds in 90 days   [facility] at [facility], Laurey Arrow, MD      Future Appointments            In 3 months Rutherford Guys, MD Primary Care at [place], [facility]

## 2018-01-18 ENCOUNTER — Other Ambulatory Visit: Payer: Self-pay | Admitting: Family Medicine

## 2018-01-19 NOTE — Telephone Encounter (Signed)
Requested medication (s) are due for refill today: ?  Requested medication (s) are on the active medication list: yes  Last refill:  04/15/17  Future visit scheduled: no  Notes to clinic:  anticoagulant    Requested Prescriptions  Pending Prescriptions Disp Refills   XARELTO 20 MG TABS tablet [Pharmacy Med Name: Alveda Reasons 20MG  TABLETS] 90 tablet 1    Sig: TAKE 1 TABLET(20 MG) BY MOUTH DAILY WITH SUPPER     Hematology: Anticoagulants - rivaroxaban Failed - 01/18/2018  5:07 PM      Failed - Cr in normal range and within 360 days    Creat  Date Value Ref Range Status  12/19/2014 0.63 0.50 - 0.99 mg/dL Final   Creatinine, Ser  Date Value Ref Range Status  12/18/2017 1.41 (H) 0.57 - 1.00 mg/dL Final         Failed - HCT in normal range and within 360 days    Hematocrit  Date Value Ref Range Status  11/20/2017 31.2 (L) 34.0 - 46.6 % Final         Failed - HGB in normal range and within 360 days    Hemoglobin  Date Value Ref Range Status  11/20/2017 10.1 (L) 11.1 - 15.9 g/dL Final         Passed - ALT in normal range and within 180 days    ALT  Date Value Ref Range Status  11/13/2017 14 0 - 32 IU/L Final         Passed - AST in normal range and within 180 days    AST  Date Value Ref Range Status  11/13/2017 16 0 - 40 IU/L Final         Passed - PLT in normal range and within 360 days    Platelets  Date Value Ref Range Status  11/20/2017 357 150 - 450 x10E3/uL Final   Platelet Count, POC  Date Value Ref Range Status  03/16/2017 296 142 - 424 K/uL Final         Passed - Valid encounter within last 12 months    Recent Outpatient Visits          1 month ago Essential hypertension, benign   [facility] at [facility], Lilia Argue, MD   2 months ago Anemia, unspecified type   [facility] at [facility], Lilia Argue, MD   2 months ago Bilateral leg edema   [facility] at [facility], Lilia Argue, MD   3 months ago Unintentional weight loss   Primary Care at [facility], [place], PA-C   4 months ago Unintentional weight loss of more than 10 pounds in 90 days   [facility] at [facility], Laurey Arrow, MD      Future Appointments            In 3 months Rutherford Guys, MD Primary Care at [place], [facility]

## 2018-01-25 ENCOUNTER — Telehealth: Payer: Self-pay | Admitting: *Deleted

## 2018-01-25 ENCOUNTER — Ambulatory Visit: Payer: Medicare Other | Admitting: Adult Health

## 2018-01-25 NOTE — Telephone Encounter (Signed)
Patient was no show for follow up with NP today.  

## 2018-01-26 ENCOUNTER — Encounter: Payer: Self-pay | Admitting: Adult Health

## 2018-01-28 NOTE — Telephone Encounter (Signed)
Please advise 

## 2018-04-19 ENCOUNTER — Telehealth (INDEPENDENT_AMBULATORY_CARE_PROVIDER_SITE_OTHER): Payer: Medicare Other | Admitting: Family Medicine

## 2018-04-19 DIAGNOSIS — I10 Essential (primary) hypertension: Secondary | ICD-10-CM

## 2018-04-19 DIAGNOSIS — G309 Alzheimer's disease, unspecified: Secondary | ICD-10-CM

## 2018-04-19 DIAGNOSIS — F0280 Dementia in other diseases classified elsewhere without behavioral disturbance: Secondary | ICD-10-CM

## 2018-04-19 MED ORDER — LISINOPRIL-HYDROCHLOROTHIAZIDE 10-12.5 MG PO TABS: 1.0000 | ORAL_TABLET | Freq: Every day | ORAL | 1 refills | Status: DC

## 2018-04-19 NOTE — Progress Notes (Signed)
Husband is having no medical concerns regarding wife, Wife says she feel fantastic. She is needing a refill on the lisinopril, medication and pharmacy verified

## 2018-04-19 NOTE — Progress Notes (Signed)
Virtual Visit via telephone Note  I connected with patient's husband on 04/19/18 at 209pm by telephone and verified that I am speaking with the correct person using two identifiers. Tammy [name] is currently located at home and family is currently with her during visit. The provider, Rutherford Guys, MD is located in their office at time of visit.  I discussed the limitations, risks, security and privacy concerns of performing an evaluation and management service by telephone and the availability of in person appointments. I also discussed with the patient that there may be a patient responsible charge related to this service. The patient expressed understanding and agreed to proceed.  Telephone visit today for routine followup  HPI ? Patient is a 73 y.o. 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  Last OV dec 2019 History provided by husband due to advance dementia Needs to be feed, responds well, eating well Needs help with bathing Sleeping well, no awakenings Had to start use depends as she has lost sense of when she needs to urinate or have a bowel movement Denies any falls.  No behavior concerns Has not had any more leg edema Does not check BP at home Needs refills of BP medication Has no acute concerns today  BP Readings from Last 3 Encounters:  12/18/17 130/84  11/20/17 (!) 159/109  11/13/17 (!) 148/100     Fall Risk  04/19/2018 12/18/2017 11/20/2017 09/12/2017 08/12/2017  Falls in the past year? 0 0 0 No No  Number falls in past yr: 0 - - - -  Injury with Fall? 0 - - - -     Depression screen [facility] 2/9 04/19/2018 12/18/2017 11/20/2017  Decreased Interest 0 0 0  Down, Depressed, Hopeless 0 0 0  PHQ - 2 Score 0 0 0    Allergies  Allergen Reactions  . Sulfa Antibiotics Palpitations    Prior to Admission medications   Medication Sig Start Date End Date Taking? Authorizing Provider   donepezil (ARICEPT) 5 MG tablet Take 1 tablet (5 mg total) by mouth at bedtime. 09/12/17   [name], MD  feeding supplement, ENSURE ENLIVE, (ENSURE ENLIVE) LIQD Take 237 mLs by mouth 2 (two) times daily between meals. 03/08/17   Hongalgi, Lenis Dickinson, MD  lisinopril-hydrochlorothiazide (PRINZIDE,ZESTORETIC) 10-12.5 MG tablet Take 1 tablet by mouth daily. 11/20/17   Rutherford Guys, MD  megestrol (MEGACE) 40 MG/ML suspension SHAKE LIQUID AND TAKE 20 ML(800 MG) BY MOUTH DAILY 01/14/18   [name], MD  Melatonin 5 MG TABS Take 1 tablet (5 mg total) by mouth at bedtime. About 1-2 hours before bed 04/30/17   [name], MD  mirtazapine (REMERON) 45 MG tablet Take 1 tablet (45 mg total) by mouth at bedtime. 04/30/17   [name], MD  senna-docusate (SENOKOT-S) 8.6-50 MG tablet Take 4 tablets by mouth at bedtime. 10/07/17   McVey, Gelene Mink, PA-C  XARELTO 20 MG TABS tablet TAKE 1 TABLET(20 MG) BY MOUTH DAILY WITH SUPPER 02/02/18   [name], MD    Past Medical History:  Diagnosis Date  . Alzheimer's disease ([place]) 12/28/2014  . Dementia ([place])   . Headache disorder 12/28/2014  . Memory disturbance 12/28/2014  . Pulmonary embolus ([place]) 02/23/2016  . Right femoral vein DVT ([place]) 02/24/2016    No past surgical history on file.  Social History   Tobacco Use  . Smoking status: Never Smoker  .  Smokeless tobacco: Never Used  Substance Use Topics  . Alcohol use: No    Family History  Problem Relation Age of Onset  . Heart disease Brother   . Heart attack Mother   . Dementia Mother     ROS Per hpi  Objective  Vitals as reported by the patient: none  There were no vitals filed for this visit.  ASSESSMENT and PLAN  1. Essential hypertension, benign  2. Alzheimer's dementia without behavioral disturbance, unspecified timing of dementia onset [facility])  Patient is overall stable. Doing ok. Continue current regime.  Other orders - lisinopril-hydrochlorothiazide (PRINZIDE,ZESTORETIC)  10-12.5 MG tablet; Take 1 tablet by mouth daily.  FOLLOW-UP: 3 months   The above assessment and management plan was discussed with the patient. The patient verbalized understanding of and has agreed to the management plan. Patient is aware to call the clinic if symptoms persist or worsen. Patient is aware when to return to the clinic for a follow-up visit. Patient educated on when it is appropriate to go to the emergency department.    I provided 10 minutes of non-face-to-face time during this encounter.  Rutherford Guys, MD Primary Care at [facility] [place], [place] [address] Ph.  [phone] Fax [phone]

## 2018-05-12 ENCOUNTER — Telehealth: Payer: Self-pay

## 2018-05-12 NOTE — Telephone Encounter (Signed)
Unable to get in contact with the patient to convert her office visit into a doxy.me visit with Jinny Blossom, NP on 05/19/2018. I left a voicemail asking her to return my call. Office number was provided.

## 2018-05-19 ENCOUNTER — Encounter: Payer: Self-pay | Admitting: Adult Health

## 2018-05-19 ENCOUNTER — Other Ambulatory Visit: Payer: Self-pay

## 2018-05-19 ENCOUNTER — Ambulatory Visit (INDEPENDENT_AMBULATORY_CARE_PROVIDER_SITE_OTHER): Payer: Medicare Other | Admitting: Adult Health

## 2018-05-19 VITALS — BP 120/90 | HR 63 | Temp 97.3°F | Ht 66.0 in | Wt 119.0 lb

## 2018-05-19 DIAGNOSIS — F0280 Dementia in other diseases classified elsewhere without behavioral disturbance: Secondary | ICD-10-CM | POA: Diagnosis not present

## 2018-05-19 DIAGNOSIS — G309 Alzheimer's disease, unspecified: Secondary | ICD-10-CM | POA: Diagnosis not present

## 2018-05-19 NOTE — Progress Notes (Signed)
PATIENT: Tammy [name] DOB: 01/05/1946  REASON FOR VISIT: follow up HISTORY FROM: patient  HISTORY OF PRESENT ILLNESS: Today 05/19/18:  Tammy [name] is a 73 year old female with a history of memory disturbance.  She returns today for follow-up.  She is here today with her husband.  He reports that her memory has continued to decline.  She requires assistance with all ADLs.  He reports that she sometimes can eat finger foods otherwise he does have to feed her.  He states that she is not sleeping well at night.  He does note that she sleeps a lot during the day.  He continues to give her Remeron at bedtime.  He also notes that the patient continues to lose weight.  Aricept was decreased to 5 mg at the last visit.  He continues to supplement with Ensure.  Patient returns today for evaluation.  HISTORY 06/29/17 Tammy [name] is a 73 year old female with a history of memory disturbance.  She returns today for follow-up.  She lives at home with her husband.  She requires assistance with all ADLs.  She does not operate a motor vehicle.  She no longer does any cooking.  Her husband manages her finances as well as her medications and appointments.  He denies any changes in her mood or behavior.  He denies any hallucinations.  Patient remains on Aricept 10 mg at bedtime.  He notes that he is having a hard time getting her to eat.  He is trying to supplement with Ensure.  Reports that she has lost a significant amount of weight.  REVIEW OF SYSTEMS: Out of a complete 14 system review of symptoms, the patient complains only of the following symptoms, and all other reviewed systems are negative.  See HPI  ALLERGIES: Allergies  Allergen Reactions  . Sulfa Antibiotics Palpitations    HOME MEDICATIONS: Outpatient Medications Prior to Visit  Medication Sig Dispense Refill  . donepezil (ARICEPT) 5 MG tablet Take 1 tablet (5 mg total) by mouth at bedtime. 30 tablet 11  . feeding supplement, ENSURE ENLIVE,  (ENSURE ENLIVE) LIQD Take 237 mLs by mouth 2 (two) times daily between meals. 237 mL 12  . lisinopril-hydrochlorothiazide (PRINZIDE,ZESTORETIC) 10-12.5 MG tablet Take 1 tablet by mouth daily. 90 tablet 1  . megestrol (MEGACE) 40 MG/ML suspension SHAKE LIQUID AND TAKE 20 ML(800 MG) BY MOUTH DAILY 600 mL 2  . Melatonin 5 MG TABS Take 1 tablet (5 mg total) by mouth at bedtime. About 1-2 hours before bed  0  . mirtazapine (REMERON) 45 MG tablet Take 1 tablet (45 mg total) by mouth at bedtime. 90 tablet 1  . senna-docusate (SENOKOT-S) 8.6-50 MG tablet Take 4 tablets by mouth at bedtime. 360 tablet 1  . XARELTO 20 MG TABS tablet TAKE 1 TABLET(20 MG) BY MOUTH DAILY WITH SUPPER 90 tablet 0   No facility-administered medications prior to visit.     PAST MEDICAL HISTORY: Past Medical History:  Diagnosis Date  . Alzheimer's disease ([place]) 12/28/2014  . Dementia ([place])   . Headache disorder 12/28/2014  . Memory disturbance 12/28/2014  . Pulmonary embolus ([place]) 02/23/2016  . Right femoral vein DVT ([place]) 02/24/2016    PAST SURGICAL HISTORY: No past surgical history on file.  FAMILY HISTORY: Family History  Problem Relation Age of Onset  . Heart disease Brother   . Heart attack Mother   . Dementia Mother     SOCIAL HISTORY: Social History   Socioeconomic History  . Marital status:  Married    Spouse name: [name]  . Number of children: 3  . Years of education: 10  . Highest education level: Not on file  Occupational History  . Occupation: retired  Scientific laboratory technician  . Financial resource strain: Not on file  . Food insecurity:    Worry: Not on file    Inability: Not on file  . Transportation needs:    Medical: Not on file    Non-medical: Not on file  Tobacco Use  . Smoking status: Never Smoker  . Smokeless tobacco: Never Used  Substance and Sexual Activity  . Alcohol use: No  . Drug use: No  . Sexual activity: Yes  Lifestyle  . Physical activity:    Days per week: Not on file     Minutes per session: Not on file  . Stress: Not on file  Relationships  . Social connections:    Talks on phone: Not on file    Gets together: Not on file    Attends religious service: Not on file    Active member of club or organization: Not on file    Attends meetings of clubs or organizations: Not on file    Relationship status: Not on file  . Intimate partner violence:    Fear of current or ex partner: Not on file    Emotionally abused: Not on file    Physically abused: Not on file    Forced sexual activity: Not on file  Other Topics Concern  . Not on file  Social History Narrative   Married with 3 children   Right handed   8th grade   1 cup coffee daily      PHYSICAL EXAM  Vitals:   05/19/18 1023  BP: 120/90  Pulse: 63  Temp: (!) 97.3 F (36.3 C)  TempSrc: Oral  Weight: 119 lb (54 kg)  Height: 5\' 6"  (1.676 m)   Body mass index is 19.21 kg/m.  Generalized: Well developed, in no acute distress   Neurological examination  Mentation: Alert. Follows all commands intermittently.  Speech is limited Cranial nerve II-XII: Pupils were equal round reactive to light. Extraocular movements were full, visual field were full on confrontational test. Facial sensation and strength were normal. Uvula tongue midline.  Motor: Good strength throughout Sensory: Sensory testing is intact to soft touch on all 4 extremities. No evidence of extinction is noted.  Coordination: Unable to test.  Patient would not follow commands Gait and station: Patient requires assistance with ambulating.  Tandem gait not attempted.   DIAGNOSTIC DATA (LABS, IMAGING, TESTING) - I reviewed patient records, labs, notes, testing and imaging myself where available.  Lab Results  Component Value Date   WBC 7.0 11/20/2017   HGB 10.1 (L) 11/20/2017   HCT 31.2 (L) 11/20/2017   MCV 88 11/20/2017   PLT 357 11/20/2017      Component Value Date/Time   NA 142 12/18/2017 1445   K 4.1 12/18/2017 1445    CL 109 (H) 12/18/2017 1445   CO2 19 (L) 12/18/2017 1445   GLUCOSE 94 12/18/2017 1445   GLUCOSE 83 03/08/2017 0313   BUN 43 (H) 12/18/2017 1445   CREATININE 1.41 (H) 12/18/2017 1445   CREATININE 0.63 12/19/2014 1246   CALCIUM 9.6 12/18/2017 1445   PROT 7.2 11/13/2017 1207   ALBUMIN 3.9 11/13/2017 1207   AST 16 11/13/2017 1207   ALT 14 11/13/2017 1207   ALKPHOS 60 11/13/2017 1207   BILITOT 0.3 11/13/2017 1207  GFRNONAA 38 (L) 12/18/2017 1445   GFRAA 43 (L) 12/18/2017 1445   Lab Results  Component Value Date   CHOL 250 (H) 02/23/2017   HDL 69 02/23/2017   LDLCALC 164 (H) 02/23/2017   TRIG 87 02/23/2017   CHOLHDL 3.6 02/23/2017   Lab Results  Component Value Date   HGBA1C 6.0 (H) 08/12/2017   Lab Results  Component Value Date   VITAMINB12 395 11/20/2017   Lab Results  Component Value Date   TSH 1.400 11/13/2017      ASSESSMENT AND PLAN 73 y.o. year old female  has a past medical history of Alzheimer's disease ([place]) (12/28/2014), Dementia ([place]), Headache disorder (12/28/2014), Memory disturbance (12/28/2014), Pulmonary embolus ([place]) (02/23/2016), and Right femoral vein DVT ([place]) (02/24/2016). here with :  1.  Alzheimer's disease  Overall the patient has remained stable.  Aricept will be discontinued due to weight loss.  I have encouraged her husband to try to limit sleeping during the day in order to promote sleep at night.  She will continue on Remeron.  Advised that if her symptoms worsen or she develops new symptoms he should let us know.  He will follow-up in 6 months or sooner if needed.   I spent 15 minutes with the patient. 50% of this time was spent discussing plan of care   Ward Givens, MSN, NP-C 05/19/2018, 10:05 AM [facility] [address] [place], [place] [address] [phone]

## 2018-05-19 NOTE — Patient Instructions (Addendum)
Stop Aricept  Continue Remeron If your symptoms worsen or you develop new symptoms please let us know.

## 2018-05-19 NOTE — Progress Notes (Signed)
I have read the note, and I agree with the clinical assessment and plan.  Stasia Somero K Mesiah Manzo   

## 2018-05-25 ENCOUNTER — Telehealth: Payer: Self-pay | Admitting: Family Medicine

## 2018-05-25 NOTE — Telephone Encounter (Signed)
Copied from [facility] [phone]. Topic: Quick Communication - Rx Refill/Question >> May 25, 2018 10:10 AM Pauline Good wrote: Medication: Xarelto   Has the patient contacted their pharmacy? yes (Agent: If no, request that the patient contact the pharmacy for the refill.) (Agent: If yes, when and what did the pharmacy advise?) had 0 refill and was told to call the office  Preferred Pharmacy (with phone number or street name): [facility]  Agent: Please be advised that RX refills may take up to 3 business days. We ask that you follow-up with your pharmacy.

## 2018-05-28 ENCOUNTER — Other Ambulatory Visit: Payer: Self-pay

## 2018-05-28 MED ORDER — RIVAROXABAN 20 MG PO TABS: ORAL_TABLET | ORAL | 0 refills | Status: DC

## 2018-05-28 NOTE — Telephone Encounter (Signed)
Sent in

## 2018-06-02 ENCOUNTER — Telehealth: Payer: Self-pay

## 2018-06-02 NOTE — Telephone Encounter (Signed)
Unable to get in contact with the patient to schedule their 6 month follow up with Megan. I left a voicemail asking the patient to return my call. Office number was provided.  If patient calls back please schedule their follow up appt.   

## 2018-07-06 ENCOUNTER — Telehealth: Payer: Self-pay | Admitting: *Deleted

## 2018-07-06 NOTE — Telephone Encounter (Signed)
Schedule AWV.  

## 2018-07-12 ENCOUNTER — Other Ambulatory Visit: Payer: Self-pay

## 2018-07-12 ENCOUNTER — Ambulatory Visit (INDEPENDENT_AMBULATORY_CARE_PROVIDER_SITE_OTHER): Payer: Medicare Other | Admitting: Family Medicine

## 2018-07-12 VITALS — BP 120/90 | Ht 66.0 in | Wt 119.0 lb

## 2018-07-12 DIAGNOSIS — Z0000 Encounter for general adult medical examination without abnormal findings: Secondary | ICD-10-CM

## 2018-07-12 NOTE — Progress Notes (Signed)
Presents today for [employer] Visit   Date of last exam: 04-19-2018  Interpreter used for this visit? no  Patient Care Team: [name], MD as PCP - General (Family Medicine) Kathrynn Ducking, MD as Consulting Physician (Neurology) Princess Bruins, MD as Consulting Physician (Obstetrics and Gynecology) Everitt Amber, MD as Consulting Physician (Obstetrics and Gynecology)   Other items to address today:  I connected with  Tammy [name]  SPOKE WITH HER HUSBAND WHO IS POA  on 07/12/18 by a telephone verified that I am speaking with the correct person using two identifiers.    Other Screening: Last screening for diabetes: 08/12/2017 Last lipid screening: 02/23/2017  ADVANCE DIRECTIVES: Discussed: yes On File: no Materials Provided: yes (mailed) Immunization status:  Immunization History  Administered Date(s) Administered  . Influenza,inj,Quad PF,6+ Mos 12/19/2014, 02/22/2016  . Pneumococcal Conjugate-13 08/04/2016  . Pneumococcal Polysaccharide-23 11/20/2017     Health Maintenance Due  Topic Date Due  . TETANUS/TDAP  08/13/1965  . COLONOSCOPY  08/13/1996  . DEXA SCAN  08/14/2011     Functional Status Survey: Is the patient deaf or have difficulty hearing?: No Does the patient have difficulty seeing, even when wearing glasses/contacts?: No Does the patient have difficulty concentrating, remembering, or making decisions?: Yes Does the patient have difficulty walking or climbing stairs?: No Does the patient have difficulty dressing or bathing?: Yes Does the patient have difficulty doing errands alone such as visiting a doctor's office or shopping?: Yes   6CIT Screen 07/12/2018 07/12/2018  What Year? 4 points 4 points  What month? 3 points 3 points  What time? 3 points 3 points  Count back from 20 4 points 4 points  Months in reverse 4 points 4 points  Repeat phrase 10 points 10 points  Total Score 28 28        Clinical Support from 07/12/2018  in [facility] at [facility]  AUDIT-C Score  0       Home Environment:   Lives at home with husband Requires help with all ADL's Husband manages all finances,medications,and appoitments with help from his daughters.  States she is doing well ,patient has  ALZHEIMER'S , is followed by [facility] .  No trouble climbing stairs No scattered rugs No grab bars Adequate lighting   Patient Active Problem List   Diagnosis Date Noted  . 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 Unintentional weight loss of more than 10 pounds in 90 days 10/19/2017  . Loss of appetite 07/08/2017  . Advanced care planning/counseling discussion 07/08/2017  . Acute pulmonary embolism ([place]) 03/07/2017  . Acute DVT (deep venous thrombosis) ([place]) 03/07/2017  . Normocytic anemia 03/07/2017  . History of deep vein thrombosis (DVT) of lower extremity 03/06/2017  . History of pulmonary embolism 03/06/2017  . Unintentional weight loss of more than 5% body weight within 1 month 02/23/2017  . Essential hypertension 12/31/2016  . Pelvic mass in female 03/11/2016  . Ovarian mass 02/24/2016  . Vitamin D deficiency 01/02/2015  . Prediabetes 01/02/2015  . Memory disturbance 12/28/2014  . Alzheimer's disease ([place]) 12/28/2014     Past Medical History:  Diagnosis Date  . Alzheimer's disease ([place]) 12/28/2014  . Dementia ([place])   . Headache disorder 12/28/2014  . Memory disturbance 12/28/2014  . Pulmonary embolus ([place]) 02/23/2016  . Right femoral vein DVT ([place]) 02/24/2016     History reviewed. No pertinent surgical history.   Family History  Problem Relation Age  of Onset  . Heart disease Brother   . Heart attack Mother   . Dementia Mother      Social History   Socioeconomic History  . Marital status: Married    Spouse name: [name]  . Number of children: 3  . Years of education: 19  . Highest education level: Not on file  Occupational History  . Occupation: retired  Photographer  . Financial resource strain: Not on file  . Food insecurity    Worry: Not on file    Inability: Not on file  . Transportation needs    Medical: Not on file    Non-medical: Not on file  Tobacco Use  . Smoking status: Never Smoker  . Smokeless tobacco: Never Used  Substance and Sexual Activity  . Alcohol use: No  . Drug use: No  . Sexual activity: Yes  Lifestyle  . Physical activity    Days per week: Not on file    Minutes per session: Not on file  . Stress: Not on file  Relationships  . Social Herbalist on phone: Not on file    Gets together: Not on file    Attends religious service: Not on file    Active member of club or organization: Not on file    Attends meetings of clubs or organizations: Not on file    Relationship status: Not on file  . Intimate partner violence    Fear of current or ex partner: Not on file    Emotionally abused: Not on file    Physically abused: Not on file    Forced sexual activity: Not on file  Other Topics Concern  . Not on file  Social History Narrative   Married with 3 children   Right handed   8th grade   1 cup coffee daily     Allergies  Allergen Reactions  . Sulfa Antibiotics Palpitations     Prior to Admission medications   Medication Sig Start Date End Date Taking? Authorizing Provider  feeding supplement, ENSURE ENLIVE, (ENSURE ENLIVE) LIQD Take 237 mLs by mouth 2 (two) times daily between meals. 03/08/17  Yes Hongalgi, Lenis Dickinson, MD  lisinopril-hydrochlorothiazide (PRINZIDE,ZESTORETIC) 10-12.5 MG tablet Take 1 tablet by mouth daily. 04/19/18  Yes Rutherford Guys, MD  Melatonin 5 MG TABS Take 1 tablet (5 mg total) by mouth at bedtime. About 1-2 hours before bed 04/30/17  Yes [name], MD  mirtazapine (REMERON) 45 MG tablet Take 1 tablet (45 mg total) by mouth at bedtime. 04/30/17  Yes [name], MD  rivaroxaban (XARELTO) 20 MG TABS tablet TAKE 1 TABLET(20 MG) BY MOUTH DAILY WITH SUPPER 05/28/18  Yes Stallings,  Zoe A, MD  senna-docusate (SENOKOT-S) 8.6-50 MG tablet Take 4 tablets by mouth at bedtime. 10/07/17  Yes McVey, Gelene Mink, PA-C  megestrol (MEGACE) 40 MG/ML suspension SHAKE LIQUID AND TAKE 20 ML(800 MG) BY MOUTH DAILY Patient not taking: Reported on 07/12/2018 01/14/18   [name], MD     Depression screen [facility] 2/9 07/12/2018 04/19/2018 12/18/2017 11/20/2017 08/12/2017  Decreased Interest 0 0 0 0 0  Down, Depressed, Hopeless 0 0 0 0 0  PHQ - 2 Score 0 0 0 0 0     Fall Risk  07/12/2018 04/19/2018 12/18/2017 11/20/2017 09/12/2017  Falls in the past year? 0 0 0 0 No  Number falls in past yr: 0 0 - - -  Injury with Fall? 0 0 - - -  Follow up Falls evaluation completed;Education provided;Falls prevention discussed - - - -      PHYSICAL EXAM: There were no vitals taken for this visit.   Wt Readings from Last 3 Encounters:  05/19/18 119 lb (54 kg)  11/20/17 124 lb (56.2 kg)  10/07/17 124 lb 6.4 oz (56.4 kg)     No exam data present    Physical Exam   Education/Counseling provided regarding diet and exercise, prevention of chronic diseases, smoking/tobacco cessation, if applicable, and reviewed "Covered Medicare Preventive Services."

## 2018-08-12 ENCOUNTER — Telehealth (INDEPENDENT_AMBULATORY_CARE_PROVIDER_SITE_OTHER): Payer: Medicare Other | Admitting: Emergency Medicine

## 2018-08-12 ENCOUNTER — Other Ambulatory Visit: Payer: Self-pay

## 2018-08-12 ENCOUNTER — Encounter: Payer: Self-pay | Admitting: Emergency Medicine

## 2018-08-12 DIAGNOSIS — I959 Hypotension, unspecified: Secondary | ICD-10-CM

## 2018-08-12 NOTE — Progress Notes (Signed)
Telemedicine Encounter- SOAP NOTE Established Patient  This telephone encounter was conducted with the patient's (or proxy's) verbal consent via audio telecommunications: yes/no: Yes Patient was instructed to have this encounter in a suitably private space; and to only have persons present to whom they give permission to participate. In addition, patient identity was confirmed by use of name plus two identifiers (DOB and address).  I discussed the limitations, risks, security and privacy concerns of performing an evaluation and management service by telephone and the availability of in person appointments. I also discussed with the patient that there may be a patient responsible charge related to this service. The patient expressed understanding and agreed to proceed.   No chief complaint on file. Questionable low blood pressure  Subjective   Tammy [name] is a 73 y.o. female established patient.  Patient of Dr. Pamella Pert.  First visit with me.  Medical record reviewed.  Telephone visit today for concern regarding low blood pressure.  Patient has a history of dementia.  Case discussed with Mr. Xzandria Clevinger.  [facility] visiting nurse was at their home 1 week ago and found patient's blood pressure to be 92/60.  Asymptomatic.  She recommended he contact PCP.  Over the last week patient has been eating and drinking well.  No flulike symptoms.  No fever or chills.  Behaving her usual way.  No vomiting or diarrhea.  Not complaining of any pain.  Patient has a history of hypertension on lisinopril/HCTZ 10/12.5.  Has remained asymptomatic without any complaints.  HPI   Patient Active Problem List   Diagnosis Date Noted  . 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 Unintentional weight loss of more than 10 pounds in 90 days 10/19/2017  . Loss of appetite 07/08/2017  . Advanced care planning/counseling discussion 07/08/2017  . Acute pulmonary embolism ([place]) 03/07/2017  .  Acute DVT (deep venous thrombosis) ([place]) 03/07/2017  . Normocytic anemia 03/07/2017  . History of deep vein thrombosis (DVT) of lower extremity 03/06/2017  . History of pulmonary embolism 03/06/2017  . Unintentional weight loss of more than 5% body weight within 1 month 02/23/2017  . Essential hypertension 12/31/2016  . Pelvic mass in female 03/11/2016  . Ovarian mass 02/24/2016  . Vitamin D deficiency 01/02/2015  . Prediabetes 01/02/2015  . Memory disturbance 12/28/2014  . Alzheimer's disease ([place]) 12/28/2014    Past Medical History:  Diagnosis Date  . Alzheimer's disease ([place]) 12/28/2014  . Dementia ([place])   . Headache disorder 12/28/2014  . Memory disturbance 12/28/2014  . Pulmonary embolus ([place]) 02/23/2016  . Right femoral vein DVT ([facility]) 02/24/2016    Current Outpatient Medications  Medication Sig Dispense Refill  . feeding supplement, ENSURE ENLIVE, (ENSURE ENLIVE) LIQD Take 237 mLs by mouth 2 (two) times daily between meals. 237 mL 12  . lisinopril-hydrochlorothiazide (PRINZIDE,ZESTORETIC) 10-12.5 MG tablet Take 1 tablet by mouth daily. 90 tablet 1  . Melatonin 5 MG TABS Take 1 tablet (5 mg total) by mouth at bedtime. About 1-2 hours before bed  0  . mirtazapine (REMERON) 45 MG tablet Take 1 tablet (45 mg total) by mouth at bedtime. 90 tablet 1  . rivaroxaban (XARELTO) 20 MG TABS tablet TAKE 1 TABLET(20 MG) BY MOUTH DAILY WITH SUPPER 90 tablet 0  . megestrol (MEGACE) 40 MG/ML suspension SHAKE LIQUID AND TAKE 20 ML(800 MG) BY MOUTH DAILY (Patient not taking: Reported on 07/12/2018) 600 mL 2  . senna-docusate (SENOKOT-S) 8.6-50 MG tablet Take 4  tablets by mouth at bedtime. (Patient not taking: Reported on 08/12/2018) 360 tablet 1   No current facility-administered medications for this visit.     Allergies  Allergen Reactions  . Sulfa Antibiotics Palpitations    Social History   Socioeconomic History  . Marital status: Married    Spouse name: [name]  . Number of  children: 3  . Years of education: 72  . Highest education level: Not on file  Occupational History  . Occupation: retired  Scientific laboratory technician  . Financial resource strain: Not on file  . Food insecurity    Worry: Not on file    Inability: Not on file  . Transportation needs    Medical: Not on file    Non-medical: Not on file  Tobacco Use  . Smoking status: Never Smoker  . Smokeless tobacco: Never Used  Substance and Sexual Activity  . Alcohol use: No  . Drug use: No  . Sexual activity: Yes  Lifestyle  . Physical activity    Days per week: Not on file    Minutes per session: Not on file  . Stress: Not on file  Relationships  . Social Herbalist on phone: Not on file    Gets together: Not on file    Attends religious service: Not on file    Active member of club or organization: Not on file    Attends meetings of clubs or organizations: Not on file    Relationship status: Not on file  . Intimate partner violence    Fear of current or ex partner: Not on file    Emotionally abused: Not on file    Physically abused: Not on file    Forced sexual activity: Not on file  Other Topics Concern  . Not on file  Social History Narrative   Married with 3 children   Right handed   8th grade   1 cup coffee daily    Review of Systems  Constitutional: Negative.  Negative for chills and fever.  HENT: Negative for congestion and sore throat.   Respiratory: Negative.  Negative for cough and shortness of breath.   Cardiovascular: Negative.  Negative for chest pain.  Gastrointestinal: Negative.  Negative for abdominal pain, diarrhea, nausea and vomiting.  Genitourinary: Negative.  Negative for dysuria, frequency and hematuria.  Musculoskeletal: Negative for myalgias and neck pain.  Skin: Negative.  Negative for rash.  Neurological: Negative for dizziness and headaches.  All other systems reviewed and are negative.   Objective   Vitals as reported by the patient: There were  no vitals filed for this visit.  Diagnoses and all orders for this visit:  Transient hypotension     Clinically stable.  No red flag signs or symptoms.  Patient asymptomatic per husband. Should be seen in the office by Dr. Pamella Pert as soon as possible for further evaluation.  I discussed the assessment and treatment plan with the patient. The patient was provided an opportunity to ask questions and all were answered. The patient agreed with the plan and demonstrated an understanding of the instructions.   The patient was advised to call back or seek an in-person evaluation if the symptoms worsen or if the condition fails to improve as anticipated.    Horald Pollen, MD  Primary Care at [facility]

## 2018-08-12 NOTE — Progress Notes (Signed)
Called patient to triage for appointment. I checked the HIPPA release of information, the patient's spouse Tammy [name] answered the triage questions. [name] stated the [facility] nurse was there last Thursday for a visit and after taking the blood pressure it was low 92/60. The patient's spouse is concerned about the low blood pressure.

## 2018-08-24 ENCOUNTER — Telehealth: Payer: Self-pay | Admitting: Family Medicine

## 2018-08-24 NOTE — Telephone Encounter (Signed)
LVM to schedule appt with Dr. Pamella Pert ASAP for low bp per Dr. Mitchel Honour

## 2018-08-24 NOTE — Progress Notes (Signed)
LVM to schedule appt with Dr. Pamella Pert

## 2018-09-01 ENCOUNTER — Other Ambulatory Visit: Payer: Self-pay | Admitting: Family Medicine

## 2018-09-01 NOTE — Telephone Encounter (Signed)
Please advise looks like pt is due for blood work

## 2018-09-26 ENCOUNTER — Other Ambulatory Visit: Payer: Self-pay | Admitting: Family Medicine

## 2018-09-30 ENCOUNTER — Other Ambulatory Visit: Payer: Self-pay

## 2018-09-30 ENCOUNTER — Other Ambulatory Visit: Payer: Self-pay | Admitting: Family Medicine

## 2018-09-30 MED ORDER — LISINOPRIL-HYDROCHLOROTHIAZIDE 10-12.5 MG PO TABS: 1.0000 | ORAL_TABLET | Freq: Every day | ORAL | 2 refills | Status: DC

## 2018-12-28 IMAGING — CT CT ABD-PELV W/ CM
2 of 5 series · 16 of 46 positions shown, 18 images · IV contrast (ISOVUE)
Comparison: None.

CLINICAL DATA: Patient with alzheimer's disease. Husband reports
possible abdominal pain today.

EXAM:
CT ABDOMEN AND PELVIS WITH CONTRAST
TECHNIQUE: Multidetector CT imaging of the abdomen and pelvis was performed
using the standard protocol following bolus administration of
intravenous contrast.
CONTRAST:  100mL SDGV9S-YNN IOPAMIDOL ([id number]-[id number]) INJECTION 61%

[Series 2: abd/pel with · axial · 0.72mm/px · z∈[-434,-44]mm · 13 of 91 slices shown, 15 images]
[im 7/91  soft-tissue]
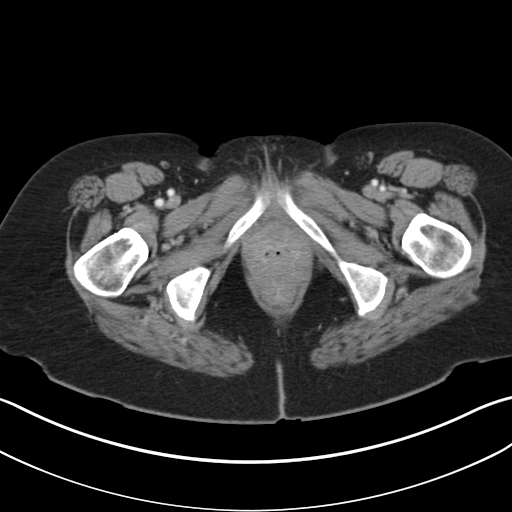
[im 7/91  bone]
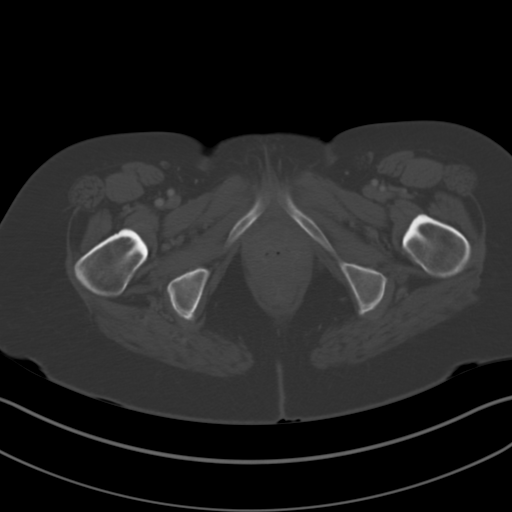
[im 13/91  soft-tissue]
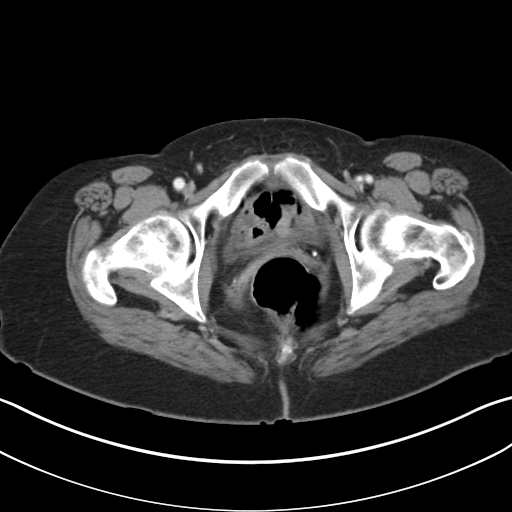
[im 19/91  soft-tissue]
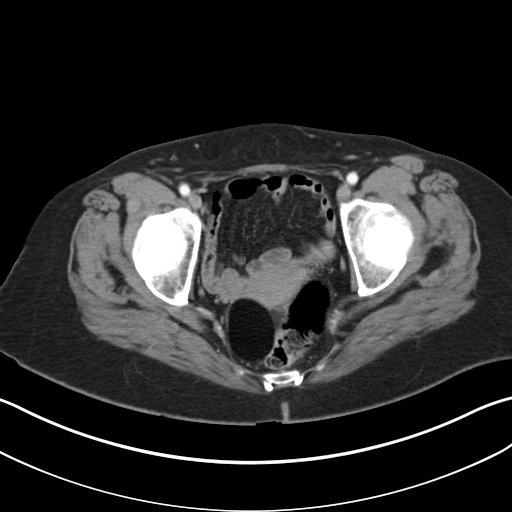
[im 25/91  soft-tissue]
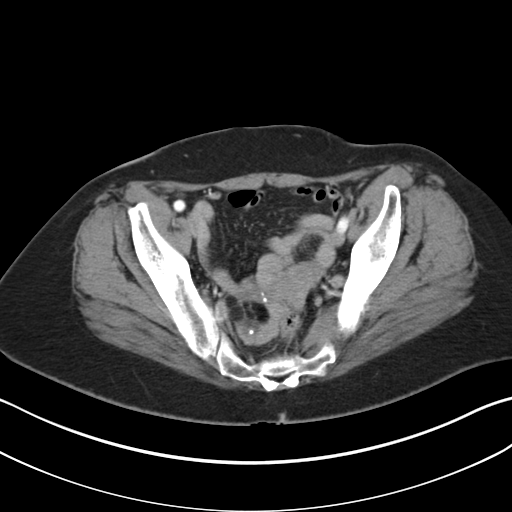
[im 31/91  soft-tissue]
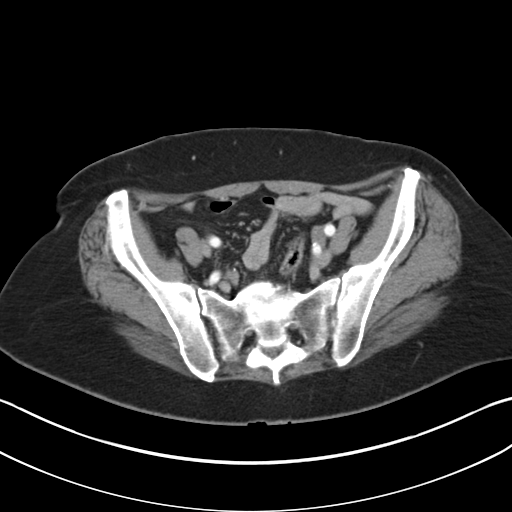
[im 37/91  soft-tissue]
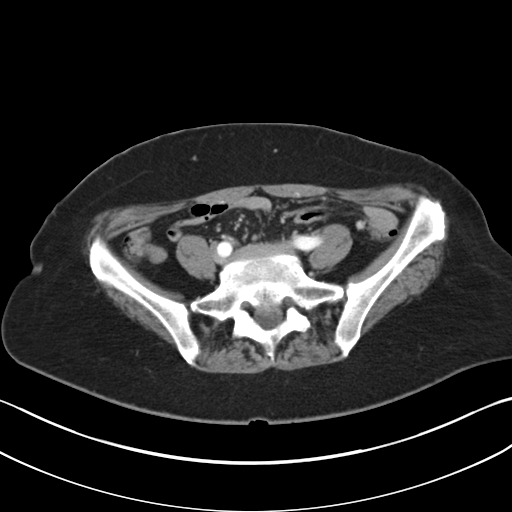
[im 49/91  soft-tissue]
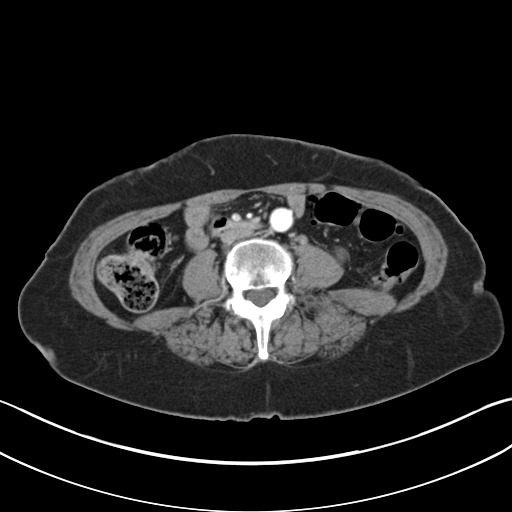
[im 55/91  soft-tissue]
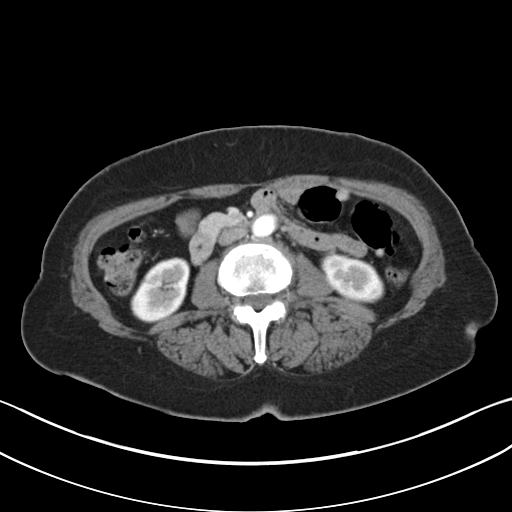
[im 61/91  soft-tissue]
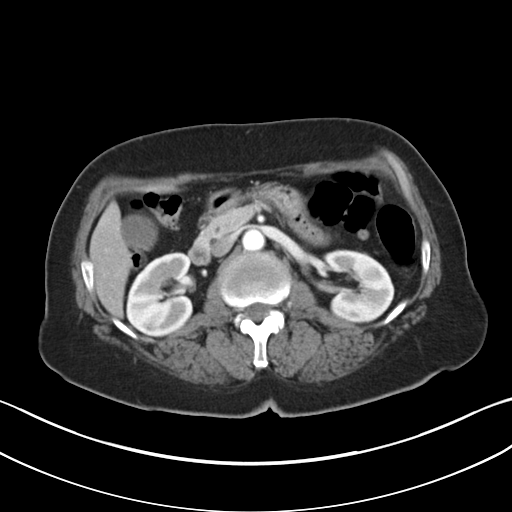
[im 61/91  bone]
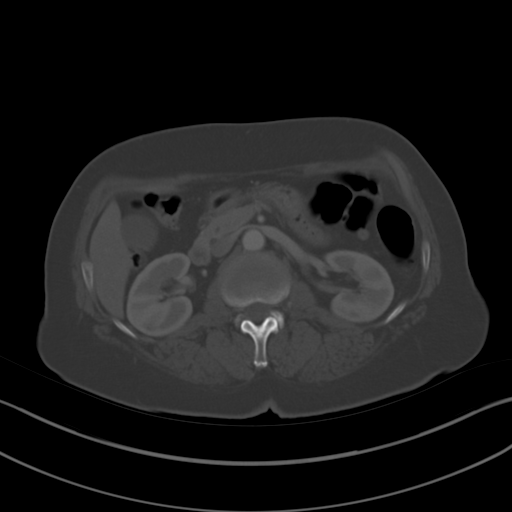
[im 67/91  soft-tissue]
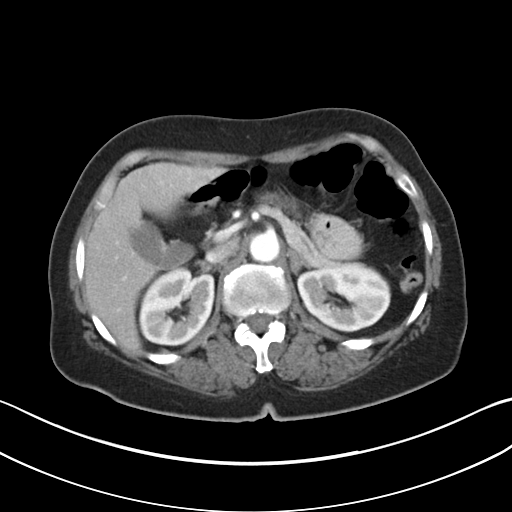
[im 73/91  soft-tissue]
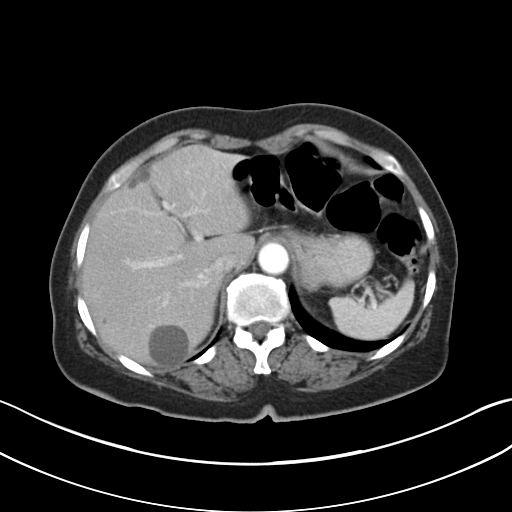
[im 79/91  soft-tissue]
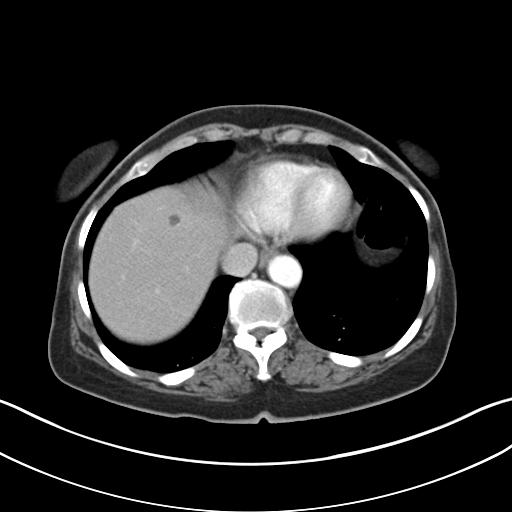
[im 85/91  soft-tissue]
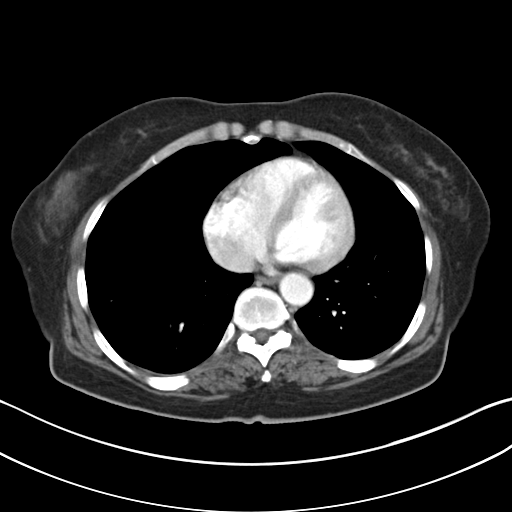

[Series 5: coronal a/|p · coronal · 0.68mm/px · 3 of 110 slices shown]
[im 37/110  soft-tissue]
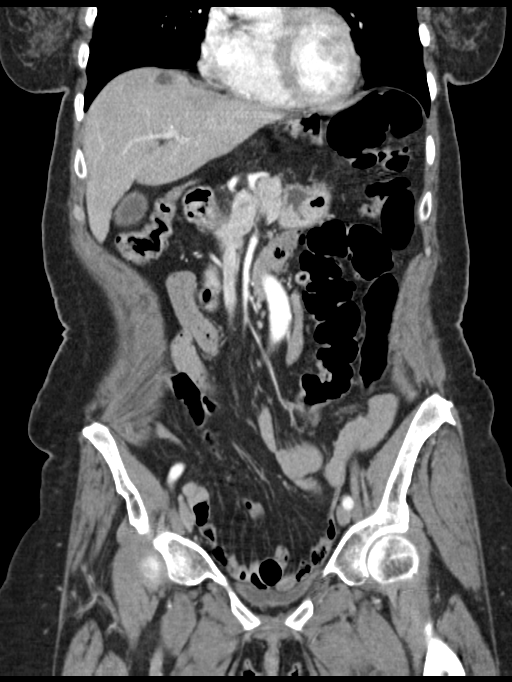
[im 49/110  soft-tissue]
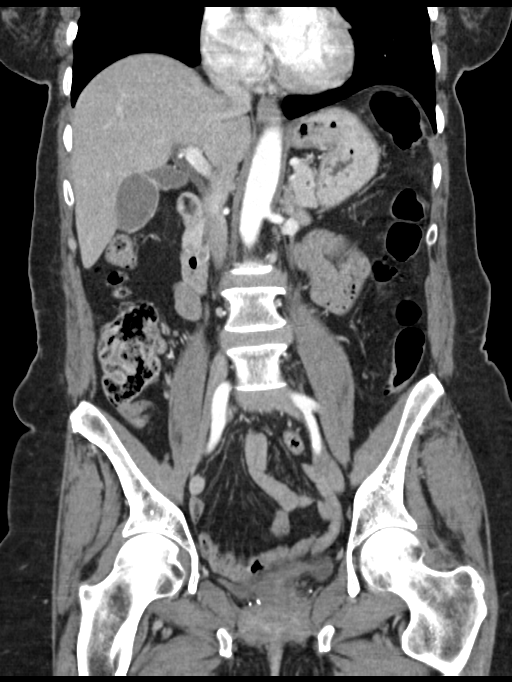
[im 61/110  soft-tissue]
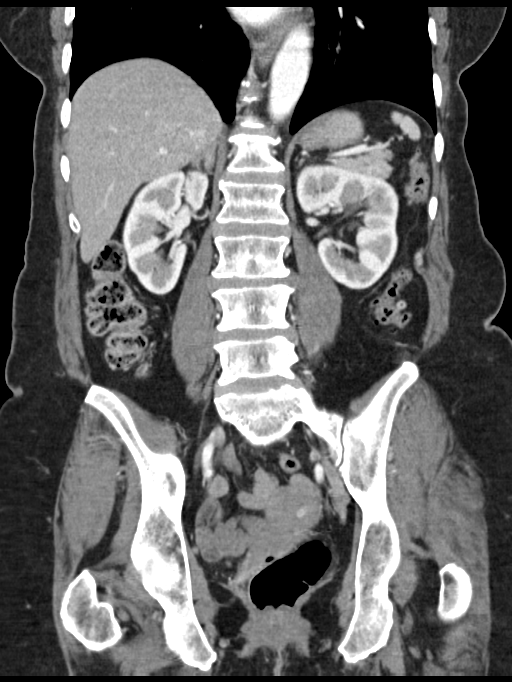

[16 of 46 positions shown; findings below may reference images not displayed]

FINDINGS: Lower chest: No acute abnormality.

Hepatobiliary: Multiple hypodense, fluid attenuating right hepatic
masses with the largest measuring 3.1 x 2.8 cm consistent with
cysts. No other focal liver abnormality is seen. No gallstones,
gallbladder wall thickening, or biliary dilatation.

Pancreas: Unremarkable. No pancreatic ductal dilatation or
surrounding inflammatory changes.

Spleen: Normal in size without focal abnormality.

Adrenals/Urinary Tract: Adrenal glands are unremarkable. Kidneys are
normal, without renal calculi, focal lesion, or hydronephrosis.
Bladder is unremarkable.

Stomach/Bowel: Stomach is within normal limits. Appendix appears
normal. No evidence of bowel wall thickening, distention, or
inflammatory changes. Diverticulosis without evidence of
diverticulitis.

Vascular/Lymphatic: No significant vascular findings are present. No
enlarged abdominal or pelvic lymph nodes.

Reproductive: Normal uterus. Bilobed fat attenuating right lower
pelvic mass measuring approximately 4.8 x 4.8 cm with the smaller
fat nodule demonstrating peripheral calcification. Larger fatty mass
has a 18 mm soft tissue mural nodule or ovarian tissue. This may
reflect a teratoma or dermoid.

Other: No abdominal wall hernia or abnormality. No abdominopelvic
ascites.

Musculoskeletal: No acute osseous abnormality. No lytic or sclerotic
osseous lesion.
IMPRESSION: 1. No acute abdominal or pelvic pathology.
2. Fat attenuating right lower pelvic mass likely arising from the
ovary. This may reflect a ovarian teratoma or dermoid.

## 2019-02-23 ENCOUNTER — Observation Stay (HOSPITAL_COMMUNITY): Payer: Medicare Other

## 2019-02-23 ENCOUNTER — Emergency Department (HOSPITAL_COMMUNITY): Payer: Medicare Other

## 2019-02-23 ENCOUNTER — Observation Stay (HOSPITAL_COMMUNITY)
Admission: EM | Admit: 2019-02-23 | Discharge: 2019-02-25 | Disposition: A | Discharge status: Home / Self Care | Payer: Medicare Other | Attending: Internal Medicine | Admitting: Internal Medicine

## 2019-02-23 ENCOUNTER — Other Ambulatory Visit: Payer: Self-pay

## 2019-02-23 DIAGNOSIS — R569 Unspecified convulsions: Principal | ICD-10-CM

## 2019-02-23 DIAGNOSIS — I129 Hypertensive chronic kidney disease with stage 1 through stage 4 chronic kidney disease, or unspecified chronic kidney disease: Secondary | ICD-10-CM | POA: Diagnosis not present

## 2019-02-23 DIAGNOSIS — Z79899 Other long term (current) drug therapy: Secondary | ICD-10-CM | POA: Insufficient documentation

## 2019-02-23 DIAGNOSIS — G300 Alzheimer's disease with early onset: Secondary | ICD-10-CM

## 2019-02-23 DIAGNOSIS — G309 Alzheimer's disease, unspecified: Secondary | ICD-10-CM | POA: Diagnosis not present

## 2019-02-23 DIAGNOSIS — Z7901 Long term (current) use of anticoagulants: Secondary | ICD-10-CM | POA: Diagnosis not present

## 2019-02-23 DIAGNOSIS — F0280 Dementia in other diseases classified elsewhere without behavioral disturbance: Secondary | ICD-10-CM

## 2019-02-23 DIAGNOSIS — Z86711 Personal history of pulmonary embolism: Secondary | ICD-10-CM | POA: Diagnosis not present

## 2019-02-23 DIAGNOSIS — I10 Essential (primary) hypertension: Secondary | ICD-10-CM | POA: Diagnosis present

## 2019-02-23 DIAGNOSIS — Z20822 Contact with and (suspected) exposure to covid-19: Secondary | ICD-10-CM | POA: Insufficient documentation

## 2019-02-23 DIAGNOSIS — N189 Chronic kidney disease, unspecified: Secondary | ICD-10-CM | POA: Diagnosis not present

## 2019-02-23 DIAGNOSIS — D649 Anemia, unspecified: Secondary | ICD-10-CM | POA: Diagnosis not present

## 2019-02-23 DIAGNOSIS — Z86718 Personal history of other venous thrombosis and embolism: Secondary | ICD-10-CM | POA: Diagnosis not present

## 2019-02-23 DIAGNOSIS — R531 Weakness: Secondary | ICD-10-CM

## 2019-02-23 DIAGNOSIS — D500 Iron deficiency anemia secondary to blood loss (chronic): Secondary | ICD-10-CM | POA: Insufficient documentation

## 2019-02-23 HISTORY — DX: Chronic kidney disease, unspecified: N18.9

## 2019-02-23 LAB — COMPREHENSIVE METABOLIC PANEL
ALT: 22 U/L (ref 0–44)
AST: 19 U/L (ref 15–41)
Albumin: 3.7 g/dL (ref 3.5–5.0)
Alkaline Phosphatase: 53 U/L (ref 38–126)
Anion gap: 10 (ref 5–15)
BUN: 21 mg/dL (ref 8–23)
CO2: 26 mmol/L (ref 22–32)
Calcium: 9.8 mg/dL (ref 8.9–10.3)
Chloride: 108 mmol/L (ref 98–111)
Creatinine, Ser: 1.01 mg/dL — ABNORMAL HIGH (ref 0.44–1.00)
GFR calc Af Amer: >60 mL/min (ref >60)
GFR calc non Af Amer: 56 mL/min — ABNORMAL LOW (ref >60)
Glucose, Bld: 133 mg/dL — ABNORMAL HIGH (ref 70–99)
Potassium: 3.9 mmol/L (ref 3.5–5.1)
Sodium: 144 mmol/L (ref 135–145)
Total Bilirubin: 0.7 mg/dL (ref 0.3–1.2)
Total Protein: 7.1 g/dL (ref 6.5–8.1)

## 2019-02-23 LAB — CBC WITH DIFFERENTIAL/PLATELET
Abs Immature Granulocytes: 0.07 10*3/uL (ref 0.00–0.07)
Basophils Absolute: 0 10*3/uL (ref 0.0–0.1)
Basophils Relative: 1 %
Eosinophils Absolute: 0 10*3/uL (ref 0.0–0.5)
Eosinophils Relative: 1 %
HCT: 34.9 % — ABNORMAL LOW (ref 36.0–46.0)
Hemoglobin: 11.2 g/dL — ABNORMAL LOW (ref 12.0–15.0)
Immature Granulocytes: 2 %
Lymphocytes Relative: 38 %
Lymphs Abs: 1.8 10*3/uL (ref 0.7–4.0)
MCH: 29.2 pg (ref 26.0–34.0)
MCHC: 32.1 g/dL (ref 30.0–36.0)
MCV: 91.1 fL (ref 80.0–100.0)
Monocytes Absolute: 0.3 10*3/uL (ref 0.1–1.0)
Monocytes Relative: 6 %
Neutro Abs: 2.5 10*3/uL (ref 1.7–7.7)
Neutrophils Relative %: 52 %
Platelets: 228 10*3/uL (ref 150–400)
RBC: 3.83 MIL/uL — ABNORMAL LOW (ref 3.87–5.11)
RDW: 14.6 % (ref 11.5–15.5)
WBC: 4.8 10*3/uL (ref 4.0–10.5)
nRBC: 0 % (ref 0.0–0.2)

## 2019-02-23 LAB — RAPID URINE DRUG SCREEN, HOSP PERFORMED
Amphetamines: NOT DETECTED
Barbiturates: NOT DETECTED
Benzodiazepines: NOT DETECTED
Cocaine: NOT DETECTED
Opiates: NOT DETECTED
Tetrahydrocannabinol: NOT DETECTED

## 2019-02-23 LAB — URINALYSIS, ROUTINE W REFLEX MICROSCOPIC
Bilirubin Urine: NEGATIVE
Glucose, UA: NEGATIVE mg/dL
Hgb urine dipstick: NEGATIVE
Ketones, ur: NEGATIVE mg/dL
Leukocytes,Ua: NEGATIVE
Nitrite: NEGATIVE
Protein, ur: NEGATIVE mg/dL
Specific Gravity, Urine: 1.023 (ref 1.005–1.030)
pH: 5 (ref 5.0–8.0)

## 2019-02-23 LAB — SARS CORONAVIRUS 2 (TAT 6-24 HRS): SARS Coronavirus 2: NEGATIVE

## 2019-02-23 LAB — ETHANOL: Alcohol, Ethyl (B): <10 mg/dL (ref <10)

## 2019-02-23 MED ORDER — ACETAMINOPHEN 325 MG PO TABS: 650.0000 mg | ORAL_TABLET | Freq: Four times a day (QID) | ORAL | Status: DC | PRN

## 2019-02-23 MED ORDER — SODIUM CHLORIDE 0.9 % IV SOLN
250.0000 mg | Freq: Two times a day (BID) | INTRAVENOUS | Status: DC
  Filled 2019-02-23: qty 2.5

## 2019-02-23 MED ORDER — LORAZEPAM 2 MG/ML IJ SOLN: 1.0000 mg | INTRAMUSCULAR | Status: DC | PRN

## 2019-02-23 MED ORDER — SODIUM CHLORIDE 0.9 % IV SOLN
250.0000 mg | Freq: Two times a day (BID) | INTRAVENOUS | Status: DC
  Administered 2019-02-24: 250 mg via INTRAVENOUS
  Filled 2019-02-23 (×2): qty 2.5

## 2019-02-23 MED ORDER — SODIUM CHLORIDE 0.9 % IV SOLN: Freq: Once | INTRAVENOUS | Status: AC

## 2019-02-23 MED ORDER — LEVETIRACETAM IN NACL 1000 MG/100ML IV SOLN
1000.0000 mg | INTRAVENOUS | Status: AC
  Administered 2019-02-23: 17:00:00 1000 mg via INTRAVENOUS
  Filled 2019-02-23: qty 100

## 2019-02-23 MED ORDER — RIVAROXABAN 20 MG PO TABS
20.0000 mg | ORAL_TABLET | Freq: Every day | ORAL | Status: DC
  Administered 2019-02-24: 17:00:00 20 mg via ORAL
  Filled 2019-02-23 (×2): qty 1

## 2019-02-23 MED ORDER — HYDROCHLOROTHIAZIDE 12.5 MG PO CAPS
12.5000 mg | ORAL_CAPSULE | Freq: Every day | ORAL | Status: DC
  Administered 2019-02-24: 09:00:00 12.5 mg via ORAL
  Filled 2019-02-23: qty 1

## 2019-02-23 MED ORDER — ACETAMINOPHEN 650 MG RE SUPP: 650.0000 mg | Freq: Four times a day (QID) | RECTAL | Status: DC | PRN

## 2019-02-23 MED ORDER — ONDANSETRON HCL 4 MG/2ML IJ SOLN: 4.0000 mg | Freq: Four times a day (QID) | INTRAMUSCULAR | Status: DC | PRN

## 2019-02-23 MED ORDER — ONDANSETRON HCL 4 MG PO TABS: 4.0000 mg | ORAL_TABLET | Freq: Four times a day (QID) | ORAL | Status: DC | PRN

## 2019-02-23 MED ORDER — LISINOPRIL 10 MG PO TABS
10.0000 mg | ORAL_TABLET | Freq: Every day | ORAL | Status: DC
  Administered 2019-02-24: 09:00:00 10 mg via ORAL
  Filled 2019-02-23: qty 1

## 2019-02-23 NOTE — ED Provider Notes (Signed)
[name] Provider Note   CSN: [id number] Arrival date & time: 02/23/19  [id number]     History Chief Complaint  Patient presents with  . Seizures    Tammy [name] is a 74 y.o. female.  HPI Level 5 caveat due to altered mental status.  Reportedly found with a seizure.  Reportedly was in bed.  Reportedly urinated on herself.  Reported tonic-clonic activity lasted around a minute.  Confused here.  Has a history of dementia but reportedly will normally carry on conversations and has only mild confusion.  No injury.  No history of seizures.  Has a history of pulmonary embolism and is on Xarelto, but is reportedly been out for a week.    Past Medical History:  Diagnosis Date  . Alzheimer's disease ([place]) 12/28/2014  . Dementia ([place])   . Headache disorder 12/28/2014  . Memory disturbance 12/28/2014  . Pulmonary embolus ([place]) 02/23/2016  . Right femoral vein DVT ([place]) 02/24/2016    Patient Active Problem List   Diagnosis Date Noted  . 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 Unintentional weight loss of more than 10 pounds in 90 days 10/19/2017  . Loss of appetite 07/08/2017  . Advanced care planning/counseling discussion 07/08/2017  . Acute pulmonary embolism ([place]) 03/07/2017  . Acute DVT (deep venous thrombosis) ([place]) 03/07/2017  . Normocytic anemia 03/07/2017  . History of deep vein thrombosis (DVT) of lower extremity 03/06/2017  . History of pulmonary embolism 03/06/2017  . Unintentional weight loss of more than 5% body weight within 1 month 02/23/2017  . Essential hypertension 12/31/2016  . Pelvic mass in female 03/11/2016  . Ovarian mass 02/24/2016  . Vitamin D deficiency 01/02/2015  . Prediabetes 01/02/2015  . Memory disturbance 12/28/2014  . Alzheimer's disease ([place]) 12/28/2014    No past surgical history on file.   OB History    Gravida  3   Para      Term      Preterm      AB  0   Living  3     SAB      TAB      Ectopic  0   Multiple      Live Births              Family History  Problem Relation Age of Onset  . Heart disease Brother   . Heart attack Mother   . Dementia Mother     Social History   Tobacco Use  . Smoking status: Never Smoker  . Smokeless tobacco: Never Used  Substance Use Topics  . Alcohol use: No  . Drug use: No    Home Medications Prior to Admission medications   Medication Sig Start Date End Date Taking? Authorizing Provider  donepezil (ARICEPT) 10 MG tablet Take 10 mg by mouth at bedtime.   Yes [provider]  feeding supplement, ENSURE ENLIVE, (ENSURE ENLIVE) LIQD Take 237 mLs by mouth 2 (two) times daily between meals. 03/08/17  Yes Hongalgi, Lenis Dickinson, MD  lisinopril-hydrochlorothiazide (ZESTORETIC) 10-12.5 MG tablet Take 1 tablet by mouth daily. 09/30/18  Yes Rutherford Guys, MD  mirtazapine (REMERON) 45 MG tablet Take 1 tablet (45 mg total) by mouth at bedtime. 04/30/17  Yes [name], MD  Multiple Vitamin (MULTIVITAMIN WITH MINERALS) TABS tablet Take 1 tablet by mouth daily.   Yes [provider]  XARELTO 20 MG TABS tablet TAKE 1 TABLET(20 MG) BY MOUTH DAILY  WITH SUPPER Patient taking differently: Take 20 mg by mouth daily with supper.  09/02/18  Yes Rutherford Guys, MD    Allergies    Sulfa antibiotics  Review of Systems   Review of Systems  Unable to perform ROS: Mental status change    Physical Exam Updated Vital Signs BP (!) 154/105 (BP Location: Left Arm)   Pulse 98   Resp (!) 23   SpO2 100%   Physical Exam Vitals and nursing note reviewed.  Constitutional:      Comments: Sitting in the bed with her left hand up on her face.  Will not follow commands but breathing spontaneously.  HENT:     Head: Normocephalic and atraumatic.  Eyes:     Pupils: Pupils are equal, round, and reactive to light.  Cardiovascular:     Rate and Rhythm: Regular rhythm.  Pulmonary:     Breath sounds: No wheezing or rhonchi.   Abdominal:     Tenderness: There is no abdominal tenderness.  Musculoskeletal:        General: No tenderness.     Cervical back: Neck supple.  Skin:    General: Skin is warm.     Capillary Refill: Capillary refill takes less than 2 seconds.  Neurological:     Comments: Sitting in bed.  Will not follow commands.  Has her left hand resting up against her face.  Pupils reactive.  Breathing spontaneously     ED Results / Procedures / Treatments   Labs (all labs ordered are listed, but only abnormal results are displayed) Labs Reviewed  COMPREHENSIVE METABOLIC PANEL - Abnormal; Notable for the following components:      Result Value   Glucose, Bld 133 (*)    Creatinine, Ser 1.01 (*)    GFR calc non Af Amer 56 (*)    All other components within normal limits  CBC WITH DIFFERENTIAL/PLATELET - Abnormal; Notable for the following components:   RBC 3.83 (*)    Hemoglobin 11.2 (*)    HCT 34.9 (*)    All other components within normal limits  URINALYSIS, ROUTINE W REFLEX MICROSCOPIC - Abnormal; Notable for the following components:   APPearance HAZY (*)    All other components within normal limits  ETHANOL  RAPID URINE DRUG SCREEN, HOSP PERFORMED    EKG EKG Interpretation  Date/Time:  Wednesday February 23 2019 08:18:13 EST Ventricular Rate:  74 PR Interval:    QRS Duration: 95 QT Interval:  418 QTC Calculation: 464 R Axis:   65 Text Interpretation: Sinus rhythm Short PR interval Anteroseptal infarct, age indeterminate Confirmed by Davonna Belling [phone]) on 02/23/2019 9:26:49 AM   Radiology CT Head Wo Contrast  Result Date: 02/23/2019 CLINICAL DATA:  Nontraumatic seizure EXAM: CT HEAD WITHOUT CONTRAST TECHNIQUE: Contiguous axial images were obtained from the base of the skull through the vertex without intravenous contrast. Sagittal and coronal MPR images reconstructed from axial data set. Patient uncooperative for exam. COMPARISON:  None FINDINGS: Brain: Generalized  atrophy. Normal ventricular morphology. No midline shift or mass effect. Minimal small vessel chronic ischemic changes of deep cerebral white matter. Foot on appearing basal ganglia calcification bilaterally. No intracranial hemorrhage, mass lesion or evidence of acute infarction. No extra-axial fluid collections. Vascular: No hyperdense vessels. Skull: Intact Sinuses/Orbits: Clear Other: N/A IMPRESSION: Generalized atrophy with minimal small vessel chronic ischemic changes of deep cerebral white matter. No acute intracranial abnormalities. Electronically Signed   By: Lavonia Dana M.D.   On: 02/23/2019 09:45  DG Chest Portable 1 View  Result Date: 02/23/2019 CLINICAL DATA:  Seizure. EXAM: PORTABLE CHEST 1 VIEW COMPARISON:  02/23/2016 FINDINGS: Lungs are adequately inflated with mild elevation of the right hemidiaphragm. There is no focal airspace consolidation or effusion. No pneumothorax. Cardiomediastinal silhouette is normal. Nodular density over the left base unchanged likely the left nipple. Remainder the exam is unchanged. IMPRESSION: No acute findings. Electronically Signed   By: Marin Olp M.D.   On: 02/23/2019 12:22    Procedures Procedures (including critical care time)  Medications Ordered in ED Medications - No data to display  ED Course  I have reviewed the triage vital signs and the nursing notes.  Pertinent labs & imaging results that were available during my care of the patient were reviewed by me and considered in my medical decision making (see chart for details).    MDM Rules/Calculators/A&P                      Patient presents after likely new onset seizure. Witnessed by husband. Does have a rather severe dementia at baseline however. Mental status improved somewhat here. EKG and lab work overall reassuring, but patient continues to be weak. Mental status improved but still cannot get up and walk which she normally be able to do. With continued weakness after 6 and half  hours of monitoring feels the patient benefit from admission to the hospital. Will discussed with unassigned medicine. Final Clinical Impression(s) / ED Diagnoses Final diagnoses:  Seizure ([place])  Weakness    Rx / DC Orders ED Discharge Orders    None       Davonna Belling, MD 02/23/19 1447

## 2019-02-23 NOTE — Consult Note (Signed)
Neurology Consultation  Reason for Consult: New onset seizure Referring Physician: Fuller Plan  CC: New onset seizure  History is obtained from: Husband  HPI: Tammy [name] is a 74 y.o. 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  Per husband, patient's daughter yelled out to him this morning at 7:30-8:00.  He  found his wife with legs held straight out and foaming at mouth and tonic-clonic movements with all 4 extremities for 3-4 minutes.  Patient was brought to the emergency department for new onset seizure and neurology was consulted for further evaluation.  Patient's husband states that at baseline her normal routine would be to sit in the chair most the day, she talks very little at baseline, she is a full ADL, and has no history of seizures in the past.  As far as walking, his daughter often times will help the patient up and assist the patient walking to the bathroom.  At times he will be out in the yard and the patient will have gone up by herself and he will find her standing in the carport watching him.  As far as her not wanting to stand, and needing 2 person assist for her to stand it is very abnormal for her.  ED course  Relevant labs include -no significant abnormal labs CT head shows-generalized atrophy with minimal small vessel chronic ischemic changes of the deep cerebral white matter  Chart review (patient has been seen prior by [facility] for patient's Alzheimer's dementia.)   Past Medical History:  Diagnosis Date  . Alzheimer's disease ([place]) 12/28/2014  . Dementia ([place])   . Headache disorder 12/28/2014  . Memory disturbance 12/28/2014  . Pulmonary embolus ([place]) 02/23/2016  . 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 Heart disease Brother   . Heart attack Mother   . Dementia Mother    Social History:   reports that she has  never smoked. She has never used smokeless tobacco. She reports that she does not drink alcohol or use drugs.  Medications  Current Facility-Administered Medications:  .  0.9 %  sodium chloride infusion, , Intravenous, Once, Smith, Rondell A, MD .  acetaminophen (TYLENOL) tablet 650 mg, 650 mg, Oral, Q6H PRN **OR** acetaminophen (TYLENOL) suppository 650 mg, 650 mg, Rectal, Q6H PRN, Smith, Rondell A, MD .  LORazepam (ATIVAN) injection 1-2 mg, 1-2 mg, Intravenous, Q2H PRN, Smith, Rondell A, MD .  ondansetron (ZOFRAN) tablet 4 mg, 4 mg, Oral, Q6H PRN **OR** ondansetron (ZOFRAN) injection 4 mg, 4 mg, Intravenous, Q6H PRN, Smith, Rondell A, MD .  rivaroxaban (XARELTO) tablet 20 mg, 20 mg, Oral, Q supper, Smith, Rondell A, MD  Current Outpatient Medications:  .  donepezil (ARICEPT) 10 MG tablet, Take 10 mg by mouth at bedtime., Disp: , Rfl:  .  feeding supplement, ENSURE ENLIVE, (ENSURE ENLIVE) LIQD, Take 237 mLs by mouth 2 (two) times daily between meals., Disp: 237 mL, Rfl: 12 .  lisinopril-hydrochlorothiazide (ZESTORETIC) 10-12.5 MG tablet, Take 1 tablet by mouth daily., Disp: 90 tablet, Rfl: 2 .  mirtazapine (REMERON) 45 MG tablet, Take 1 tablet (45 mg total) by mouth at bedtime., Disp: 90 tablet, Rfl: 1 .  Multiple Vitamin (MULTIVITAMIN WITH MINERALS) TABS tablet, Take 1 tablet by mouth daily., Disp: , Rfl:  .  XARELTO 20 MG TABS tablet, TAKE 1 TABLET(20 MG) BY MOUTH DAILY WITH SUPPER (Patient taking differently: Take  20 mg by mouth daily with supper. ), Disp: 90 tablet, Rfl: 0  ROS:  Unable to obtain due to altered mental status and significant dementia.   Exam: Current vital signs: BP (!) 150/89   Pulse 90   Resp (!) 23   SpO2 100%  Vital signs in last 24 hours: Pulse Rate:  [70-98] 90 (02/10 1445) Resp:  [12-27] 23 (02/10 1424) BP: (113-155)/(68-105) 150/89 (02/10 1445) SpO2:  [100 %] 100 % (02/10 1445)   Constitutional: Cachectic Eyes: No scleral injection HENT: No OP  obstrucion Head: Normocephalic.  Cardiovascular: Normal rate and regular rhythm.  Respiratory: Effort normal, non-labored breathing GI: Soft.  No distension. There is no tenderness.  Skin: WDI  Neuro: Mental Status: Patient is awake, tracks practitioner in the room, follows no local or visual commands. She states "I'm cold" and when pinched states "Stop, that hurst" and when I ask "Did that hurt?" she answers with an affirmative head nod.  Cranial Nerves: II: Blinks to threat III,IV, VI: EOMI  Pupils equal, round and reactive to light V: Facial sensation is symmetric to temperature VII: Facial movement is symmetric.  VIII: hearing is intact to as she winces clapping hands Motor: Tone is normal. Bulk is decreased. 5/5 strength was present in all four extremities.  Sensory: Winces to pain in all 4 extremities Deep Tendon Reflexes: 2+ and symmetric in the biceps and patellae.  Plantars: Toes are downgoing bilaterally.  Cerebellar: Unable to assess  Labs I have reviewed labs in epic and the results pertinent to this consultation are:   CBC    Component Value Date/Time   WBC 4.8 02/23/2019 0835   RBC 3.83 (L) 02/23/2019 0835   HGB 11.2 (L) 02/23/2019 0835   HGB 10.1 (L) 11/20/2017 1629   HCT 34.9 (L) 02/23/2019 0835   HCT 31.2 (L) 11/20/2017 1629   PLT 228 02/23/2019 0835   PLT 357 11/20/2017 1629   MCV 91.1 02/23/2019 0835   MCV 88 11/20/2017 1629   MCH 29.2 02/23/2019 0835   MCHC 32.1 02/23/2019 0835   RDW 14.6 02/23/2019 0835   RDW 13.0 11/20/2017 1629   LYMPHSABS 1.8 02/23/2019 0835   LYMPHSABS 1.6 11/20/2017 1629   MONOABS 0.3 02/23/2019 0835   EOSABS 0.0 02/23/2019 0835   EOSABS 0.0 11/20/2017 1629   BASOSABS 0.0 02/23/2019 0835   BASOSABS 0.1 11/20/2017 1629    CMP     Component Value Date/Time   NA 144 02/23/2019 0835   NA 142 12/18/2017 1445   K 3.9 02/23/2019 0835   CL 108 02/23/2019 0835   CO2 26 02/23/2019 0835   GLUCOSE 133 (H) 02/23/2019 0835    BUN 21 02/23/2019 0835   BUN 43 (H) 12/18/2017 1445   CREATININE 1.01 (H) 02/23/2019 0835   CREATININE 0.63 12/19/2014 1246   CALCIUM 9.8 02/23/2019 0835   PROT 7.1 02/23/2019 0835   PROT 7.2 11/13/2017 1207   ALBUMIN 3.7 02/23/2019 0835   ALBUMIN 3.9 11/13/2017 1207   AST 19 02/23/2019 0835   ALT 22 02/23/2019 0835   ALKPHOS 53 02/23/2019 0835   BILITOT 0.7 02/23/2019 0835   BILITOT 0.3 11/13/2017 1207   GFRNONAA 56 (L) 02/23/2019 0835   GFRAA >60 02/23/2019 0835    Lipid Panel     Component Value Date/Time   CHOL 250 (H) 02/23/2017 1057   TRIG 87 02/23/2017 1057   HDL 69 02/23/2017 1057   CHOLHDL 3.6 02/23/2017 1057   LDLCALC 164 (H) 02/23/2017 1057  Imaging I have reviewed the images obtained:  CT-scan of the brain-generalized atrophy with minimal small vessel chronic ischemic changes in the deep white matter   Etta Quill PA-C [facility] [phone]  M-F  (9:00 am- 5:00 PM)  02/23/2019, 3:52 PM   Assessment:  74 year old female with past medical history of Alzheimer's dementia who is a full ADL at home.  Patient apparently can walk with assistance at home.  Today was noted to suffer from a full tonic-clonic seizure for approximately 3 to 4 minutes at 8 AM.  Patient has no seizure history.  Given the fact that individuals with neurodegenerative decline are at higher risk of seizures I believe she would benefit from starting an antiepileptic at this time.  We will start low-dose and increase within 1 week.  We will also obtain EEG.  Impression: -New onset seizure and patient with Alzheimer's dementia  Recommendations: -Stat EEG -Load with Keppra 1 g IV now -Keppra to 250 twice daily for 1 week and then increase to 500 mg twice daily -Patient will be admitted by hospitalists and evaluated by PT tomorrow -Seizure precautions  Roland Rack, MD [facility] [phone]  If 7pm- 7am, please page neurology on call as listed  in [place].

## 2019-02-23 NOTE — Progress Notes (Signed)
Patient arrived in the unit at 2253 pm in a stretcher escorted by ED NT, pt is confused, initiated telemetry monitor box 9, seizure safety precautions is in placed, and will continue to monitor.

## 2019-02-23 NOTE — ED Notes (Signed)
Neuro at bedside.

## 2019-02-23 NOTE — H&P (Addendum)
History and Physical    [name] [id number] DOB: [birth year] DOA: 02/23/2019  Referring MD/NP/PA:Nathan Alvino Chapel, MD PCP: Rutherford Guys, MD  Patient coming from: Home via EMS  Chief Complaint: Seizure  I have personally briefly reviewed patient's old medical records in [place]   HPI: Tammy [name] is a 74 y.o. female with medical history significant of dementia, history of DVT/PE on Xarelto, and CKD stage II.  She presents after having a witnessed seizure by her husband at home.  No prior history of seizures reported.  At baseline the patient is mostly nonverbal and history is obtained from her husband.  She does speak a few words while I was interviewing her, but unclear what she was saying.  Her husband reports that he normally cannot understand what she saying.  Patient sometimes requires assistance to stand up, but sometimes can get up without assistance.  Their daughter was helping her in the bathroom this morning when she called out to him around 7:30-8 AM.  He reports seeing the patient with her legs stiffened out, eyes rolled back in head, and foaming at the mouth.  He reports that she had generalized shaking all over that lasted anywhere from 4 to 5 minutes, and during this time the patient defecated on herself.  No previous history of seizures to his knowledge.  After she would not respond to him appropriately.    ED Course: On admission into the emergency department patient was noted to be mildly tachypneic temporarily, but all other vital signs maintained.  Labs relatively unremarkable.  CT scan of the brain negative for any acute abnormalities.  Neurology evaluated the patient and gave 1 g of Keppra and recommended EEG.  They attempted to ambulate the patient but she was unable which was abnormal for the husband.  TR[facility] called to admit.    Review of Systems  Unable to perform ROS: Patient nonverbal    Past Medical History:  Diagnosis Date  . Alzheimer's disease  ([place]) 12/28/2014  . Dementia ([place])   . Headache disorder 12/28/2014  . Memory disturbance 12/28/2014  . Pulmonary embolus ([place]) 02/23/2016  . 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   reports that she has never smoked. She has never used smokeless tobacco. She reports that she does not drink alcohol or use drugs.  Allergies  Allergen Reactions  . Sulfa Antibiotics Palpitations    Family History  Problem Relation Age of Onset  . Heart disease Brother   . Heart attack Mother   . Dementia Mother     Prior to Admission medications   Medication Sig Start Date End Date Taking? Authorizing Provider  donepezil (ARICEPT) 10 MG tablet Take 10 mg by mouth at bedtime.   Yes [provider]  feeding supplement, ENSURE ENLIVE, (ENSURE ENLIVE) LIQD Take 237 mLs by mouth 2 (two) times daily between meals. 03/08/17  Yes Hongalgi, Lenis Dickinson, MD  lisinopril-hydrochlorothiazide (ZESTORETIC) 10-12.5 MG tablet Take 1 tablet by mouth daily. 09/30/18  Yes Rutherford Guys, MD  mirtazapine (REMERON) 45 MG tablet Take 1 tablet (45 mg total) by mouth at bedtime. 04/30/17  Yes [name], MD  Multiple Vitamin (MULTIVITAMIN WITH MINERALS) TABS tablet Take 1 tablet by mouth daily.   Yes [provider]  XARELTO 20 MG TABS tablet TAKE 1 TABLET(20 MG) BY MOUTH DAILY WITH SUPPER Patient taking differently: Take 20 mg by mouth daily with supper.  09/02/18  Yes Rutherford Guys, MD    Physical Exam:  Constitutional: Frail elderly female who does not really follow commands Vitals:   02/23/19 1230 02/23/19 1300 02/23/19 1424 02/23/19 1445  BP: (!) 152/96 (!) 151/98 (!) 154/105 (!) 150/89  Pulse:  95 98 90  Resp: 17 16 (!) 23   SpO2:  100% 100% 100%   Eyes: PERRL, lids and conjunctivae normal ENMT: Mucous membranes are moist. Posterior pharynx clear of any exudate or lesions.   Neck: normal, supple, no masses, no thyromegaly Respiratory: clear to auscultation  bilaterally, no wheezing, no crackles. Normal respiratory effort. No accessory muscle use.  Cardiovascular: Regular rate and rhythm, no murmurs / rubs / gallops. No extremity edema. 2+ pedal pulses. No carotid bruits.  Abdomen: no tenderness, no masses palpated. No hepatosplenomegaly. Bowel sounds positive.  Musculoskeletal: no clubbing / cyanosis. No joint deformity upper and lower extremities. Good ROM, no contractures. Normal muscle tone.  Skin: no rashes, lesions, ulcers. No induration Neurologic: CN 2-12 grossly intact.  Able to move all extremities. Psychiatric: Alert.  Unable to assess.   Labs on Admission: I have personally reviewed following labs and imaging studies  CBC: Recent Labs  Lab 02/23/19 0835  WBC 4.8  NEUTROABS 2.5  HGB 11.2*  HCT 34.9*  MCV 91.1  PLT [id number]   Basic Metabolic Panel: Recent Labs  Lab 02/23/19 0835  NA 144  K 3.9  CL 108  CO2 26  GLUCOSE 133*  BUN 21  CREATININE 1.01*  CALCIUM 9.8   GFR: CrCl cannot be calculated (Unknown ideal weight.). Liver Function Tests: Recent Labs  Lab 02/23/19 0835  AST 19  ALT 22  ALKPHOS 53  BILITOT 0.7  PROT 7.1  ALBUMIN 3.7   No results for input(s): LIPASE, AMYLASE in the last 168 hours. No results for input(s): AMMONIA in the last 168 hours. Coagulation Profile: No results for input(s): INR, PROTIME in the last 168 hours. Cardiac Enzymes: No results for input(s): CKTOTAL, CKMB, CKMBINDEX, TROPONINI in the last 168 hours. BNP (last 3 results) No results for input(s): PROBNP in the last 8760 hours. HbA1C: No results for input(s): HGBA1C in the last 72 hours. CBG: No results for input(s): GLUCAP in the last 168 hours. Lipid Profile: No results for input(s): CHOL, HDL, LDLCALC, TRIG, CHOLHDL, LDLDIRECT in the last 72 hours. Thyroid Function Tests: No results for input(s): TSH, T4TOTAL, FREET4, T3FREE, THYROIDAB in the last 72 hours. Anemia Panel: No results for input(s): VITAMINB12, FOLATE,  FERRITIN, TIBC, IRON, RETICCTPCT in the last 72 hours. Urine analysis:    Component Value Date/Time   COLORURINE YELLOW 02/23/2019 1035   APPEARANCEUR HAZY (A) 02/23/2019 1035   LABSPEC 1.023 02/23/2019 1035   PHURINE 5.0 02/23/2019 1035   GLUCOSEU NEGATIVE 02/23/2019 1035   HGBUR NEGATIVE 02/23/2019 [place] 02/23/2019 1035   BILIRUBINUR negative 04/30/2017 1211   KETONESUR NEGATIVE 02/23/2019 1035   PROTEINUR NEGATIVE 02/23/2019 1035   UROBILINOGEN 0.2 04/30/2017 1211   NITRITE NEGATIVE 02/23/2019 1035   LEUKOCYTESUR NEGATIVE 02/23/2019 1035   Sepsis Labs: No results found for this or any previous visit (from the past 240 hour(s)).   Radiological Exams on Admission: CT Head Wo Contrast  Result Date: 02/23/2019 CLINICAL DATA:  Nontraumatic seizure EXAM: CT HEAD WITHOUT CONTRAST TECHNIQUE: Contiguous axial images were obtained from the base of the skull through the vertex without intravenous contrast. Sagittal and coronal MPR images reconstructed from axial data set. Patient uncooperative for exam. COMPARISON:  None FINDINGS: Brain: Generalized atrophy. Normal ventricular morphology. No midline shift or mass effect. Minimal small vessel chronic ischemic changes of deep cerebral white matter. Foot on appearing basal ganglia calcification bilaterally. No intracranial hemorrhage, mass lesion or evidence of acute infarction. No extra-axial fluid collections. Vascular: No hyperdense vessels. Skull: Intact Sinuses/Orbits: Clear Other: N/A IMPRESSION: Generalized atrophy with minimal small vessel chronic ischemic changes of deep cerebral white matter. No acute intracranial abnormalities. Electronically Signed   By: Lavonia Dana M.D.   On: 02/23/2019 09:45   DG Chest Portable 1 View  Result Date: 02/23/2019 CLINICAL DATA:  Seizure. EXAM: PORTABLE CHEST 1 VIEW COMPARISON:  02/23/2016 FINDINGS: Lungs are adequately inflated with mild elevation of the right hemidiaphragm. There is  no focal airspace consolidation or effusion. No pneumothorax. Cardiomediastinal silhouette is normal. Nodular density over the left base unchanged likely the left nipple. Remainder the exam is unchanged. IMPRESSION: No acute findings. Electronically Signed   By: Marin Olp M.D.   On: 02/23/2019 12:22    EKG: Independently reviewed.  Sinus rhythm at 74 bpm with  Assessment/Plan New onset seizure: Acute.  Patient witnessed having a 1 minute generalized tonic-clonic seizure at home by husband with loss of bowels.  CT scan of the brain negative for any acute findings.  Patient was loaded with 1 g of Keppra in the ED. -Admit to a telemetry bed -Seizure precaution -Monitor for seizure activity -Held Aricept and Remeron, follow-up with neurology see if these medications can be continued -Appreciate neurology consultative service -Keppra 250 mg twice daily x1 week then increase -Follow-up EEG -PT/OT to evaluate and treat  Essential hypertension: Stable -Continue lisinopril- hydrochlorothiazide  History of DVT/PE on chronic anticoagulation -Continue Xarelto  Normocytic anemia: Hemoglobin 11.2 which appears near patient's baseline. -Continue to monitor  Dementia  -Held Aricept  -Set bed alarm  Chronic kidney disease stage II : Creatinine appears near her baseline. -Continue to monitor  DVT prophylaxis: Xarelto Code Status: Full Family Communication: Discussed plan of care with the patient's husband over the phone Disposition Plan: Possible discharge home in 2 to 3 days Consults called: Neurology  Admission status: observation  Norval [name] MD [facility] Pager [phone]   If 7PM-7AM, please contact night-coverage [email or web address] Password [email or web address]  02/23/2019, 3:10 PM

## 2019-02-23 NOTE — ED Triage Notes (Addendum)
Pt here from home where family witnessed one minute of full body seizure activity this morning. No hx of seizures. Pt urinated on herself during seizure, remains post-ictal on arrival to ED. Pt has been out of Xarelto x 1 week. Hx dementia, only mind confusion per family. Pt did not fall, no injury-family present who assisted to ground.

## 2019-02-23 NOTE — ED Notes (Signed)
PureWick placed.

## 2019-02-23 NOTE — ED Notes (Signed)
Husband is available for updates and questions. [phone]

## 2019-02-23 NOTE — ED Notes (Signed)
Ordered diet tray 

## 2019-02-23 NOTE — ED Notes (Signed)
Admitting at bedside. EEG at bedside.

## 2019-02-23 NOTE — Procedures (Signed)
Patient Name: Tammy [name]  MRN: [id number]  Epilepsy Attending: Lora Havens  Referring Physician/Provider: Etta Quill, PA Date: 02/23/2019 Duration: 25.24 mins  Patient history: 74 year old female with history of dementia presented with witnessed generalized tonic-clonic seizure-like episode.  EEG to evaluate for seizures.  Level of alertness: awake  AEDs during EEG study: Keppra  Technical aspects: This EEG study was done with scalp electrodes positioned according to the 10-20 International system of electrode placement. Electrical activity was acquired at a sampling rate of 500Hz  and reviewed with a high frequency filter of 70Hz  and a low frequency filter of 1Hz . EEG data were recorded continuously and digitally stored.   Description: EEG showed continuous generalized mixed 6-9Hz  alpha-theta frequencies.  Hyperventilation and photic stimulation were not performed.  EEG was technically difficult with significant myogenic artifact.   Abnormality - Continuous slow, generalized   IMPRESSION: This technically difficult study is suggestive of mild to moderate diffuse encephalopathy, non specific to etiology. No seizures or epileptiform discharges were seen throughout the recording. If concern for interictal activity remains, can consider repeat study.   Cadan Maggart Barbra Sarks

## 2019-02-23 NOTE — Progress Notes (Signed)
Difficult hookup due to lack of cooperation. Technically difficult study results are pending

## 2019-02-24 ENCOUNTER — Encounter (HOSPITAL_COMMUNITY): Payer: Self-pay | Admitting: Internal Medicine

## 2019-02-24 DIAGNOSIS — G300 Alzheimer's disease with early onset: Secondary | ICD-10-CM | POA: Diagnosis not present

## 2019-02-24 DIAGNOSIS — R569 Unspecified convulsions: Secondary | ICD-10-CM | POA: Diagnosis not present

## 2019-02-24 DIAGNOSIS — N189 Chronic kidney disease, unspecified: Secondary | ICD-10-CM | POA: Diagnosis present

## 2019-02-24 LAB — CBC
HCT: 28.4 % — ABNORMAL LOW (ref 36.0–46.0)
Hemoglobin: 9.5 g/dL — ABNORMAL LOW (ref 12.0–15.0)
MCH: 29.4 pg (ref 26.0–34.0)
MCHC: 33.5 g/dL (ref 30.0–36.0)
MCV: 87.9 fL (ref 80.0–100.0)
Platelets: 196 10*3/uL (ref 150–400)
RBC: 3.23 MIL/uL — ABNORMAL LOW (ref 3.87–5.11)
RDW: 14.2 % (ref 11.5–15.5)
WBC: 8.9 10*3/uL (ref 4.0–10.5)
nRBC: 0 % (ref 0.0–0.2)

## 2019-02-24 LAB — BASIC METABOLIC PANEL
Anion gap: 9 (ref 5–15)
BUN: 15 mg/dL (ref 8–23)
CO2: 24 mmol/L (ref 22–32)
Calcium: 9 mg/dL (ref 8.9–10.3)
Chloride: 110 mmol/L (ref 98–111)
Creatinine, Ser: 0.89 mg/dL (ref 0.44–1.00)
GFR calc Af Amer: >60 mL/min (ref >60)
GFR calc non Af Amer: >60 mL/min (ref >60)
Glucose, Bld: 100 mg/dL — ABNORMAL HIGH (ref 70–99)
Potassium: 3.6 mmol/L (ref 3.5–5.1)
Sodium: 143 mmol/L (ref 135–145)

## 2019-02-24 MED ORDER — SODIUM CHLORIDE 0.9 % IV BOLUS
500.0000 mL | Freq: Once | INTRAVENOUS | Status: AC
  Administered 2019-02-24: 17:00:00 500 mL via INTRAVENOUS

## 2019-02-24 MED ORDER — LEVETIRACETAM 250 MG PO TABS
250.0000 mg | ORAL_TABLET | Freq: Two times a day (BID) | ORAL | Status: DC
  Administered 2019-02-24 – 2019-02-25 (×2): 250 mg via ORAL
  Filled 2019-02-24 (×2): qty 1

## 2019-02-24 MED ORDER — SODIUM CHLORIDE 0.9 % IV BOLUS
500.0000 mL | Freq: Once | INTRAVENOUS | Status: AC
  Administered 2019-02-24: 22:00:00 500 mL via INTRAVENOUS

## 2019-02-24 NOTE — Plan of Care (Signed)

## 2019-02-24 NOTE — Evaluation (Signed)
Occupational Therapy Evaluation Patient Details Name: Tammy [name] MRN: [id number] DOB: [birth year] Today's Date: 02/24/2019    History of Present Illness Tammy [name] is an 74 y.o. female with past medical history that includes advanced Alzheimer's baseline nonverbal, history of DVT/PE on Xarelto, chronic kidney disease stage II presents emergency department on February 10 with chief complaint of seizure.  CT of the head with generalized atrophy minimal small vessel chronic ischemic changes no acute intracranial abnormalities. EEG results technically difficult study suggestive of mild to moderate diffuse encephalopathy nonspecific to etiology. No seizures seen throughout the recording.   Clinical Impression   PTA, pt was living with her husband and daughter and required assistance for ADLs. Pt currently requiring Max-Total A for ADLs due to cognition and Min A +2 for functional mobility. Pt presenting with decreased arousal, cognition, balance, strength, and safety. Pt with good family support and per phone call with PT, family wants to dc to home. Pt would benefit from further acute OT to facilitate safe dc. Recommend dc to home with HHOT for further OT to optimize safety, independence with ADLs, and return to PLOF.      Follow Up Recommendations  Home health OT;Supervision/Assistance - 24 hour    Equipment Recommendations  3 in 1 bedside commode    Recommendations for Other Services PT consult     Precautions / Restrictions Precautions Precautions: Fall Restrictions Weight Bearing Restrictions: No      Mobility Bed Mobility Overal bed mobility: Needs Assistance Bed Mobility: Supine to Sit;Sit to Supine     Supine to sit: Max assist Sit to supine: Max assist   General bed mobility comments: MaxA for facilitation; decreased initiation by patient  Transfers Overall transfer level: Needs assistance Equipment used: 2 person hand held assist Transfers: Sit to/from Stand Sit to  Stand: Min assist;+2 safety/equipment         General transfer comment: MinA to rise from edge of bed x 2    Balance Overall balance assessment: Needs assistance Sitting-balance support: Feet supported Sitting balance-Leahy Scale: Fair Sitting balance - Comments: Able to progress to supervision   Standing balance support: Single extremity supported;During functional activity Standing balance-Leahy Scale: Poor Standing balance comment: reliant on single UE support                           ADL either performed or assessed with clinical judgement   ADL Overall ADL's : Needs assistance/impaired                                       General ADL Comments: Requiring Total A for ADLs due to cognitive deficits. Once sitting at EOB, pt with increased arousal and maintaining both eyes open. Not following commands and not participating in ADLs.      Vision         Perception     Praxis      Pertinent Vitals/Pain Pain Assessment: Faces Faces Pain Scale: Hurts little more Pain Location: grimacing with movement Pain Descriptors / Indicators: Grimacing;Moaning Pain Intervention(s): Monitored during session;Limited activity within patient's tolerance;Repositioned     Hand Dominance     Extremity/Trunk Assessment Upper Extremity Assessment Upper Extremity Assessment: Generalized weakness   Lower Extremity Assessment Lower Extremity Assessment: Defer to PT evaluation       Communication Communication Communication: Receptive difficulties;Expressive difficulties   Cognition  Arousal/Alertness: Awake/alert;Lethargic Behavior During Therapy: Flat affect Overall Cognitive Status: History of cognitive impairments - at baseline                                 General Comments: Pt with history of advanced dementia; lethargic and grinding teeth upon entry. Became more alert/aroused once sitting EOB. Did not follow simple commands, performing  automatic tasks i.e. standing and walking with cues; attempts at speech unintelligible.    General Comments       Exercises     Shoulder Instructions      Home Living Family/patient expects to be discharged to:: Private residence Living Arrangements: Children;Spouse/significant other(daughter, grandchildren) Available Help at Discharge: Family Type of Home: House Home Access: Level entry     Home Layout: One level     Bathroom Shower/Tub: Aeronautical engineer: None          Prior Functioning/Environment Level of Independence: Needs assistance  Gait / Transfers Assistance Needed: independent with household ambulation ADL's / Homemaking Assistance Needed: dependent ADL's            OT Problem List: Decreased strength;Decreased range of motion;Decreased activity tolerance;Impaired balance (sitting and/or standing);Decreased knowledge of precautions;Decreased knowledge of use of DME or AE      OT Treatment/Interventions: Self-care/ADL training;Therapeutic exercise;Energy conservation;DME and/or AE instruction;Therapeutic activities;Patient/family education    OT Goals(Current goals can be found in the care plan section) Acute Rehab OT Goals Patient Stated Goal: unable OT Goal Formulation: Patient unable to participate in goal setting Time For Goal Achievement: 03/10/19 Potential to Achieve Goals: Good  OT Frequency: Min 2X/week   Barriers to D/C:            Co-evaluation PT/OT/SLP Co-Evaluation/Treatment: Yes Reason for Co-Treatment: Necessary to address cognition/behavior during functional activity;For patient/therapist safety;To address functional/ADL transfers PT goals addressed during session: Mobility/safety with mobility OT goals addressed during session: ADL's and self-care      AM-PAC OT "6 Clicks" Daily Activity     Outcome Measure Help from another person eating meals?: Total Help from another person taking care of personal  grooming?: Total Help from another person toileting, which includes using toliet, bedpan, or urinal?: Total Help from another person bathing (including washing, rinsing, drying)?: Total Help from another person to put on and taking off regular upper body clothing?: Total Help from another person to put on and taking off regular lower body clothing?: Total 6 Click Score: 6   End of Session Nurse Communication: Mobility status  Activity Tolerance: Patient tolerated treatment well Patient left: in bed;with call bell/phone within reach;with bed alarm set  OT Visit Diagnosis: Unsteadiness on feet (R26.81);Other abnormalities of gait and mobility (R26.89);Muscle weakness (generalized) (M62.81)                Time: [id number] OT Time Calculation (min): 18 min Charges:  OT General Charges $OT Visit: 1 Visit OT Evaluation $OT Eval Moderate Complexity: [employer], OTR/L Acute Rehab Pager: [phone] Office: [place] 02/24/2019, 2:03 PM

## 2019-02-24 NOTE — Care Management Obs Status (Signed)
[place] NOTIFICATION   Patient Details  Name: DELLER LIPPENS MRN: [id number] Date of Birth: 07-27-1945   Medicare Observation Status Notification Given:  Yes    Pollie Friar, RN 02/24/2019, 3:06 PM

## 2019-02-24 NOTE — Evaluation (Signed)
Physical Therapy Evaluation Patient Details Name: Tammy [name] MRN: [id number] DOB: 05/14/1945 Today's Date: 02/24/2019   History of Present Illness  Tammy [name] is an 74 y.o. female with past medical history that includes advanced Alzheimer's baseline nonverbal, history of DVT/PE on Xarelto, chronic kidney disease stage II presents emergency department on February 10 with chief complaint of seizure.  CT of the head with generalized atrophy minimal small vessel chronic ischemic changes no acute intracranial abnormalities. EEG results technically difficult study suggestive of mild to moderate diffuse encephalopathy nonspecific to etiology. No seizures seen throughout the recording.  Clinical Impression  Prior to admission, pt lives with her husband and daughter, is independent with household ambulation, requires assist for ADL's. Pt not following simple commands, but able to perform some automatic tasks. Requiring min assist for transfers and limited room ambulation via hand held guidance. Discussed with pt daughter via phone who stated they would ultimately like to take pt home. I think this would be appropriate for pt to be in familiar environment as long as she has 24/7 supervision and assist for all mobility.     Follow Up Recommendations Home health PT;Supervision/Assistance - 24 hour    Equipment Recommendations  3in1 (PT)    Recommendations for Other Services       Precautions / Restrictions Precautions Precautions: Fall Restrictions Weight Bearing Restrictions: No      Mobility  Bed Mobility Overal bed mobility: Needs Assistance Bed Mobility: Supine to Sit;Sit to Supine     Supine to sit: Max assist Sit to supine: Max assist   General bed mobility comments: MaxA for facilitation; decreased initiation by patient  Transfers Overall transfer level: Needs assistance Equipment used: 2 person hand held assist Transfers: Sit to/from Stand Sit to Stand: Min assist;+2  safety/equipment         General transfer comment: MinA to rise from edge of bed x 2  Ambulation/Gait Ambulation/Gait assistance: Min assist;+2 safety/equipment Gait Distance (Feet): 10 Feet Assistive device: 1 person hand held assist Gait Pattern/deviations: Step-through pattern;Decreased stride length;Narrow base of support Gait velocity: decreased   General Gait Details: MinA for stability, cues for stepping initiation and direction  Stairs            Wheelchair Mobility    Modified Rankin (Stroke Patients Only)       Balance Overall balance assessment: Needs assistance Sitting-balance support: Feet supported Sitting balance-Leahy Scale: Fair Sitting balance - Comments: Able to progress to supervision   Standing balance support: Single extremity supported;During functional activity Standing balance-Leahy Scale: Poor Standing balance comment: reliant on single UE support                             Pertinent Vitals/Pain Pain Assessment: Faces Faces Pain Scale: Hurts little more Pain Location: grimacing with movement Pain Descriptors / Indicators: Grimacing;Moaning Pain Intervention(s): Limited activity within patient's tolerance;Monitored during session;Repositioned    Home Living Family/patient expects to be discharged to:: Private residence Living Arrangements: Children;Spouse/significant other(daughter, grandchildren) Available Help at Discharge: Family Type of Home: House Home Access: Level entry     Home Layout: One level Home Equipment: None      Prior Function Level of Independence: Needs assistance   Gait / Transfers Assistance Needed: independent with household ambulation  ADL's / Homemaking Assistance Needed: dependent ADL's        Hand Dominance        Extremity/Trunk Assessment   Upper Extremity  Assessment Upper Extremity Assessment: Defer to OT evaluation    Lower Extremity Assessment Lower Extremity  Assessment: Generalized weakness       Communication   Communication: Receptive difficulties;Expressive difficulties  Cognition Arousal/Alertness: Awake/alert;Lethargic Behavior During Therapy: Flat affect Overall Cognitive Status: History of cognitive impairments - at baseline                                 General Comments: Pt with history of advanced dementia; lethargic and grinding teeth upon entry. Became more alert/aroused once sitting EOB. Did not follow simple commands, performing automatic tasks i.e. standing and walking with cues; attempts at speech unintelligible.       General Comments      Exercises     Assessment/Plan    PT Assessment Patient needs continued PT services  PT Problem List Decreased strength;Decreased activity tolerance;Decreased balance;Decreased mobility;Decreased cognition;Decreased safety awareness       PT Treatment Interventions DME instruction;Gait training;Functional mobility training;Therapeutic activities;Therapeutic exercise;Balance training;Patient/family education    PT Goals (Current goals can be found in the Care Plan section)  Acute Rehab PT Goals Patient Stated Goal: unable PT Goal Formulation: Patient unable to participate in goal setting Time For Goal Achievement: 03/10/19 Potential to Achieve Goals: Fair    Frequency Min 3X/week   Barriers to discharge        Co-evaluation PT/OT/SLP Co-Evaluation/Treatment: Yes Reason for Co-Treatment: Necessary to address cognition/behavior during functional activity;For patient/therapist safety;To address functional/ADL transfers PT goals addressed during session: Mobility/safety with mobility         AM-PAC PT "6 Clicks" Mobility  Outcome Measure Help needed turning from your back to your side while in a flat bed without using bedrails?: A Little Help needed moving from lying on your back to sitting on the side of a flat bed without using bedrails?: Total Help  needed moving to and from a bed to a chair (including a wheelchair)?: A Little Help needed standing up from a chair using your arms (e.g., wheelchair or bedside chair)?: A Little Help needed to walk in hospital room?: A Little Help needed climbing 3-5 steps with a railing? : A Lot 6 Click Score: 15    End of Session Equipment Utilized During Treatment: Gait belt Activity Tolerance: Patient limited by fatigue Patient left: in bed;with call bell/phone within reach;with bed alarm set Nurse Communication: Mobility status PT Visit Diagnosis: Unsteadiness on feet (R26.81);Difficulty in walking, not elsewhere classified (R26.2)    Time: [id number] PT Time Calculation (min) (ACUTE ONLY): 18 min   Charges:   PT Evaluation $PT Eval Moderate Complexity: 1 Mod            Wyona Almas, PT, DPT Acute Rehabilitation Services Pager [phone] Office [phone]   Deno Etienne 02/24/2019, 12:46 PM

## 2019-02-24 NOTE — Progress Notes (Signed)
NEUROLOGY PROGRESS NOTE   Subjective: Patient currently in bed sleeping.  Awakens to light stimuli.  States "ouch "with any light stimuli.  Exam: Vitals:   02/24/19 0500 02/24/19 0916  BP: 97/73 100/70  Pulse: 87 94  Resp: 19 18  Temp: 98.6 F (37 C) 99.2 F (37.3 C)  SpO2: 100% 100%   Physical Exam  Constitutional: Appears well-developed and well-nourished.  Eyes: No scleral injection HENT: No OP obstrucion Head: Normocephalic.  Cardiovascular: Normal rate and regular rhythm.  Respiratory: Effort normal, non-labored breathing GI: Soft.  No distension. There is no tenderness.  Skin: WDI   Neuro:  Mental Status: Currently sleeping, as stated above awakens with light stimuli.  Will not follow commands, does not like being touched. Cranial Nerves: II: Unable to examine as patient fights practitioner to leave her alone III,IV, VI: Unable to examine as patient fights practitioner to leave her alone V,VII: Face symmetric,  VIII: hearing normal bilaterally  Motor: Patient is curled in fetal position however does move all extremities antigravity with good strength Sensory: Withdraws from noxious stimuli   Medications:  Scheduled: . hydrochlorothiazide  12.5 mg Oral Daily  . lisinopril  10 mg Oral Daily  . rivaroxaban  20 mg Oral Q supper    Pertinent Labs/Diagnostics:   EEG  Result Date: 02/23/2019  IMPRESSION: This technically difficult study is suggestive of mild to moderate diffuse encephalopathy, non specific to etiology. No seizures or epileptiform discharges were seen throughout the recording. If concern for interictal activity remains, can consider repeat study. Lora Havens   CT Head Wo Contrast  Result Date: 02/23/2019 IMPRESSION: Generalized atrophy with minimal small vessel chronic ischemic changes of deep cerebral white matter. No acute intracranial abnormalities. Electronically Signed   By: Lavonia Dana M.D.   On: 02/23/2019 09:45    Etta Quill  PA-C [facility] [id number]  Assessment:  74 year old female with past medical history of Alzheimer's dementia with need for full ADLs at home.  Patient presented to the hospital secondary to full tonic-clonic seizure that lasted for about 3 to 4 minutes yesterday morning.  EEG obtained which showed diffuse encephalopathy with no epileptiform activity.  Patient doing well with maintenance dose of 250 mg twice daily to be changed to 500 mg twice daily in approximately 1 week.  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 Jannifer Franklin at [facility]  At this time neurology will sign off please call with any questions   02/24/2019, 9:20 AM  NEUROHOSPITALIST ADDENDUM Performed a face to face diagnostic evaluation.   I have reviewed the contents of history and physical exam as documented by PA/ARNP/Resident and agree with above documentation.  I have discussed and formulated the above plan as documented. Edits to the note have been made as needed.   Patient somnolent, likely from being postictal.  No further clinical seizures.  Reviewed EEG showed no epileptiform discharges.  Continue Keppra low-dose of 250 mg twice daily with plan to increase after 1 week as well as outpatient neurology follow-up with her neurologist    Karena Addison Damen Windsor MD [facility] [id number]   If 7pm to 7am, please call on call as listed on AMION.

## 2019-02-24 NOTE — Progress Notes (Signed)
Progress Note    Tammy [name]  [id number] DOB: 08/05/1945  DOA: 02/23/2019 PCP: Rutherford Guys, MD    Brief Narrative:   Chief complaint: seizure  Medical records reviewed and are as summarized below:  Tammy [name] is an 74 y.o. female with past medical history that includes advanced Alzheimer's baseline nonverbal, history of DVT/PE on Xarelto, chronic kidney disease stage II presents emergency department on February 10 with chief complaint of seizure.  Evaluated by neurology who opined giving her neurodegenerative decline she is at risk for seizures and recommends antiepileptic medications.  Assessment/Plan:   Principal Problem:   Seizure ([place]) Active Problems:   Alzheimer's disease ([place])   Normocytic anemia   CKD (chronic kidney disease)   Essential hypertension   History of deep vein thrombosis (DVT) of lower extremity   History of pulmonary embolism   #1.  Seizure.  Appears to be in post ictal stage.  No history of same.  CT of the head with generalized atrophy minimal small vessel chronic ischemic changes no acute intracranial abnormalities.  EEG results technically difficult study suggestive of mild to moderate diffuse encephalopathy nonspecific to etiology.  No seizures or epileptiform discharges were seen throughout the recording, no metabolic derangements no sign symptoms of infectious process.  Likely related to declining neurodegenerative disease.  -Keppra per neurology recommendations -Evaluation by physical therapy/Occupational Therapy -Seizure precautions -supportive therapy  #2.  Hypertension.  Home medications include lisinopril and hydrochlorothiazide -Continue home meds -Monitor  #3.  History of DVT/PE on chronic anticoagulation.  Home medications include Xarelto. -Continue home meds -Mobilize as able  #4.  Normocytic anemia.  Hemoglobin 9.5 down from 11.2 yesterday.  May be delusional.  No sign symptoms of active bleeding.  Likely related to  chronic disease -Anemia panel -Monitor  #5.  Chronic kidney disease stage II.  Creatinine 1.01 on admission.  Chart review indicates creatinine has been 1.5 in the past.  Today creatinine 0.89. -Continue gentle IV fluids until fully awake -Hold nephrotoxins as able -Monitor urine output -Check in the morning   Family Communication/Anticipated D/C date and plan/Code Status   DVT prophylaxis: Lovenox ordered. Code Status: Full Code.  Family Communication: daughter on phone Disposition Plan: would likely benefit from placement   Medical Consultants:    Leonel Ramsay. neuro   Anti-Infectives:    None  Subjective:   Lying in bed eyes closed. Skin warm and dry, grinding teeth. Resting comfortably  Objective:    Vitals:   02/23/19 2319 02/23/19 2355 02/24/19 0500 02/24/19 0916  BP: (!) 156/141 100/87 97/73 100/70  Pulse: 73  87 94  Resp: 19  19 18   Temp: 97.7 F (36.5 C)  98.6 F (37 C) 99.2 F (37.3 C)  TempSrc: Oral  Axillary Oral  SpO2: 100%  100% 100%    Intake/Output Summary (Last 24 hours) at 02/24/2019 1121 Last data filed at 02/23/2019 2359 Gross per 24 hour  Intake 200 ml  Output --  Net 200 ml   There were no vitals filed for this visit.  Exam: General: Thin frail chronically ill-appearing lying in bed eyes closed grinding her teeth no acute distress CV: Regular rate and rhythm no murmur gallop or rub no lower extremity edema Respiratory: No increased work of breathing breath sounds clear to auscultation bilaterally I hear no wheeze no rhonchi Abdomen flat soft positive bowel sounds throughout no guarding or rebounding Neuro does not open eyes to slight tactile stimulation.  Does not respond to  verbal stimulation.  She is grinding her teeth and moving lower extremities spontaneously  Data Reviewed:   I have personally reviewed following labs and imaging studies:  Labs: Labs show the following:   Basic Metabolic Panel: Recent Labs  Lab  02/23/19 0835 02/24/19 0232  NA 144 143  K 3.9 3.6  CL 108 110  CO2 26 24  GLUCOSE 133* 100*  BUN 21 15  CREATININE 1.01* 0.89  CALCIUM 9.8 9.0   GFR CrCl cannot be calculated (Unknown ideal weight.). Liver Function Tests: Recent Labs  Lab 02/23/19 0835  AST 19  ALT 22  ALKPHOS 53  BILITOT 0.7  PROT 7.1  ALBUMIN 3.7   No results for input(s): LIPASE, AMYLASE in the last 168 hours. No results for input(s): AMMONIA in the last 168 hours. Coagulation profile No results for input(s): INR, PROTIME in the last 168 hours.  CBC: Recent Labs  Lab 02/23/19 0835 02/24/19 0232  WBC 4.8 8.9  NEUTROABS 2.5  --   HGB 11.2* 9.5*  HCT 34.9* 28.4*  MCV 91.1 87.9  PLT 228 196   Cardiac Enzymes: No results for input(s): CKTOTAL, CKMB, CKMBINDEX, TROPONINI in the last 168 hours. BNP (last 3 results) No results for input(s): PROBNP in the last 8760 hours. CBG: No results for input(s): GLUCAP in the last 168 hours. D-Dimer: No results for input(s): DDIMER in the last 72 hours. Hgb A1c: No results for input(s): HGBA1C in the last 72 hours. Lipid Profile: No results for input(s): CHOL, HDL, LDLCALC, TRIG, CHOLHDL, LDLDIRECT in the last 72 hours. Thyroid function studies: No results for input(s): TSH, T4TOTAL, T3FREE, THYROIDAB in the last 72 hours.  Invalid input(s): FREET3 Anemia work up: No results for input(s): VITAMINB12, FOLATE, FERRITIN, TIBC, IRON, RETICCTPCT in the last 72 hours. Sepsis Labs: Recent Labs  Lab 02/23/19 0835 02/24/19 0232  WBC 4.8 8.9    Microbiology Recent Results (from the past 240 hour(s))  SARS CORONAVIRUS 2 (TAT 6-24 HRS) Nasopharyngeal Nasopharyngeal Swab     Status: None   Collection Time: 02/23/19  3:54 PM   Specimen: Nasopharyngeal Swab  Result Value Ref Range Status   SARS Coronavirus 2 NEGATIVE NEGATIVE Final    Comment: (NOTE) SARS-CoV-2 target nucleic acids are NOT DETECTED. The SARS-CoV-2 RNA is generally detectable in upper and  lower respiratory specimens during the acute phase of infection. Negative results do not preclude SARS-CoV-2 infection, do not rule out co-infections with other pathogens, and should not be used as the sole basis for treatment or other patient management decisions. Negative results must be combined with clinical observations, patient history, and epidemiological information. The expected result is Negative. Fact Sheet for Patients: SugarRoll.be Fact Sheet for Healthcare Providers: https://www.woods-mathews.com/ This test is not yet approved or cleared by the [place] FDA and  has been authorized for detection and/or diagnosis of SARS-CoV-2 by FDA under an Emergency Use Authorization (EUA). This EUA will remain  in effect (meaning this test can be used) for the duration of the COVID-19 declaration under Section 56 4(b)(1) of the Act, 21 U.S.C. section 360bbb-3(b)(1), unless the authorization is terminated or revoked sooner. Performed at [facility], [place] [address], [place], [place] [address]     Procedures and diagnostic studies:  EEG  Result Date: 02/23/2019 Lora Havens, MD     02/23/2019  5:28 PM Patient Name: SAYANI SULKOWSKI MRN: [id number] Epilepsy Attending: Lora Havens Referring Physician/Provider: Etta Quill, PA Date: 02/23/2019 Duration: 25.24 mins Patient  history: 74 year old female with history of dementia presented with witnessed generalized tonic-clonic seizure-like episode.  EEG to evaluate for seizures. Level of alertness: awake AEDs during EEG study: Keppra Technical aspects: This EEG study was done with scalp electrodes positioned according to the 10-20 International system of electrode placement. Electrical activity was acquired at a sampling rate of 500Hz  and reviewed with a high frequency filter of 70Hz  and a low frequency filter of 1Hz . EEG data were recorded continuously and digitally stored. Description: EEG  showed continuous generalized mixed 6-9Hz  alpha-theta frequencies.  Hyperventilation and photic stimulation were not performed. EEG was technically difficult with significant myogenic artifact. Abnormality - Continuous slow, generalized IMPRESSION: This technically difficult study is suggestive of mild to moderate diffuse encephalopathy, non specific to etiology. No seizures or epileptiform discharges were seen throughout the recording. If concern for interictal activity remains, can consider repeat study. Lora Havens   CT Head Wo Contrast  Result Date: 02/23/2019 CLINICAL DATA:  Nontraumatic seizure EXAM: CT HEAD WITHOUT CONTRAST TECHNIQUE: Contiguous axial images were obtained from the base of the skull through the vertex without intravenous contrast. Sagittal and coronal MPR images reconstructed from axial data set. Patient uncooperative for exam. COMPARISON:  None FINDINGS: Brain: Generalized atrophy. Normal ventricular morphology. No midline shift or mass effect. Minimal small vessel chronic ischemic changes of deep cerebral white matter. Foot on appearing basal ganglia calcification bilaterally. No intracranial hemorrhage, mass lesion or evidence of acute infarction. No extra-axial fluid collections. Vascular: No hyperdense vessels. Skull: Intact Sinuses/Orbits: Clear Other: N/A IMPRESSION: Generalized atrophy with minimal small vessel chronic ischemic changes of deep cerebral white matter. No acute intracranial abnormalities. Electronically Signed   By: Lavonia Dana M.D.   On: 02/23/2019 09:45   DG Chest Portable 1 View  Result Date: 02/23/2019 CLINICAL DATA:  Seizure. EXAM: PORTABLE CHEST 1 VIEW COMPARISON:  02/23/2016 FINDINGS: Lungs are adequately inflated with mild elevation of the right hemidiaphragm. There is no focal airspace consolidation or effusion. No pneumothorax. Cardiomediastinal silhouette is normal. Nodular density over the left base unchanged likely the left nipple. Remainder the  exam is unchanged. IMPRESSION: No acute findings. Electronically Signed   By: Marin Olp M.D.   On: 02/23/2019 12:22    Medications:    hydrochlorothiazide  12.5 mg Oral Daily   lisinopril  10 mg Oral Daily   rivaroxaban  20 mg Oral Q supper   Continuous Infusions:  levETIRAcetam 250 mg (02/24/19 0452)     LOS: 0 days   Radene Gunning NP  [facility]   How to contact the [facility] Attending or Consulting provider [place] or covering provider during after hours [place], for this patient?  1. Check the care team in [facility] and look for a) attending/consulting [facility] provider listed and b) the [facility] team listed 2. Log into [email or web address] and use [place] universal password to access. If you do not have the password, please contact the hospital operator. 3. Locate the [facility] provider you are looking for under [facility] and page to a number that you can be directly reached. 4. If you still have difficulty reaching the provider, please page the [facility] (Director on Call) for the Hospitalists listed on amion for assistance.  02/24/2019, 11:21 AM

## 2019-02-24 NOTE — Progress Notes (Signed)
MD notified of pt BP which was verified with manual BP of 88/62. NS bolus order received and adm. Pt VSS after bolus below. Pt in bed with call light within reach. Will report off to oncoming RN. Delia Heady RN   02/24/19 1838  Vitals  Temp 99 F (37.2 C)  Temp Source Oral  BP (!) 116/93  MAP (mmHg) 102  BP Location Left Arm  BP Method Automatic  Patient Position (if appropriate) Sitting  Pulse Rate 97  Pulse Rate Source Monitor  Resp 20  Level of Consciousness  Level of Consciousness Alert  Oxygen Therapy  SpO2 99 %  O2 Device Room Air

## 2019-02-25 DIAGNOSIS — G300 Alzheimer's disease with early onset: Secondary | ICD-10-CM | POA: Diagnosis not present

## 2019-02-25 MED ORDER — LEVETIRACETAM 500 MG PO TABS: 500.0000 mg | ORAL_TABLET | Freq: Two times a day (BID) | ORAL | 1 refills | Status: DC

## 2019-02-25 MED ORDER — LEVETIRACETAM 250 MG PO TABS: 250.0000 mg | ORAL_TABLET | Freq: Two times a day (BID) | ORAL | 0 refills | Status: DC

## 2019-02-25 NOTE — Progress Notes (Signed)
   02/25/19 1054  Vitals  Temp 97.7 F (36.5 C)  BP (!) 83/62  MAP (mmHg) 70  BP Location Left Arm  BP Method Automatic  Patient Position (if appropriate) Sitting  Pulse Rate 79  Resp 18  Oxygen Therapy  SpO2 98 %  O2 Device Room Air  MEWS Score  MEWS Temp 0  MEWS Systolic 1  MEWS Pulse 0  MEWS RR 0  MEWS LOC 0  MEWS Score 1  MEWS Score Color Green   Pt MD and NP notified of pt BP and manual recheck of 86/58. Pt asymptomatic, sitting up in bed with spouse at bedside. No new orders received yet. Will continue to closely monitor pt. Delia Heady RN

## 2019-02-25 NOTE — Progress Notes (Signed)
Pt discharge education and instructions completed with spouse and pt daughter at bedside both voices understanding and denies any questions. Pt IV and telemetry removed; pt family to pick up electronically sent prescription from preferred pharmacy on file. Pt showered with daughter assistance and to be transported off unit via wheelchair with belongings to the side. Delia Heady RN

## 2019-02-25 NOTE — Discharge Instructions (Signed)
Per [place] DMV statutes, patients with seizures are not allowed to drive until they have been seizure-free for six months.    Use caution when using heavy equipment or power tools. Avoid working on ladders or at heights. Take showers instead of baths. Ensure the water temperature is not too high on the home water heater. Do not go swimming alone. Do not lock yourself in a room alone (i.e. bathroom). When caring for infants or small children, sit down when holding, feeding, or changing them to minimize risk of injury to the child in the event you have a seizure. Maintain good sleep hygiene. Avoid alcohol.    If patient has another seizure, call 911 and bring them back to the ED if: A.  The seizure lasts longer than 5 minutes.      B.  The patient doesn't wake shortly after the seizure or has new problems such as difficulty seeing, speaking or moving following the seizure C.  The patient was injured during the seizure D.  The patient has a temperature over 102 F (39C) E.  The patient vomited during the seizure and now is having trouble breathing  

## 2019-02-25 NOTE — Discharge Summary (Signed)
Physician Discharge Summary  Tammy [name] [id number] DOB: [birth year] DOA: 02/23/2019  PCP: Rutherford Guys, MD  Admit date: 02/23/2019 Discharge date: 02/25/2019  Admitted From: home Discharge disposition: home with home health   Recommendations for Outpatient Follow-Up:   1. Follow-up with neurology 1 to 2 weeks 2. Home health PT 3. Follow-up with primary care provider 1 to 2 weeks for evaluation of blood pressure medications have changed   Discharge Diagnosis:   Principal Problem:   Seizure ([place]) Active Problems:   Alzheimer's disease ([place])   Normocytic anemia   CKD (chronic kidney disease)   Essential hypertension   History of deep vein thrombosis (DVT) of lower extremity   History of pulmonary embolism    Discharge Condition: Improved.  Diet recommendation: Low sodium, heart healthy.    Wound care: None.  Code status: Full.   History of Present Illness:   Tammy [name] is a 74 y.o. female with medical history significant of dementia, history of DVT/PE on Xarelto, and CKD stage II.  She presented 2/10 after having a witnessed seizure by her husband at home.  No prior history of seizures reported.  At baseline the patient is mostly nonverbal and history is obtained from her husband.  She did speak a few words while  interviewing her, but unclear what she was saying.  Her husband reported that he normally cannot understand what she saying.  Patient sometimes requires assistance to stand up, but sometimes can get up without assistance.  Their daughter was helping her in the bathroom that morning when she called out to him around 7:30-8 AM.  He reported seeing the patient with her legs stiffened out, eyes rolled back in head, and foaming at the mouth.  He reported that she had generalized shaking all over that lasted anywhere from 4 to 5 minutes, and during that time the patient defecated on herself.  No previous history of seizures to his knowledge.  After she  would not respond to him appropriately.     Hospital Course by Problem:   #1.  Seizure. Awake alert this am. Had bried post ictal stage.  No history of same.  CT of the head with generalized atrophy minimal small vessel chronic ischemic changes no acute intracranial abnormalities.  EEG results technically difficult study suggestive of mild to moderate diffuse encephalopathy nonspecific to etiology.  No seizures or epileptiform discharges were seen throughout the recording, no metabolic derangements no sign symptoms of infectious process.  Evaluated by neurology who opined likely related to declining neurodegenerative disease and recommended Keppra.  She was given a loading dose of Keppra and prescribed Keppra 250 mg twice daily for 7 days and then Keppra 500 mg twice daily.  She will follow up with Dr. Jannifer Franklin.  No further seizure activity  #2.  Hypertension.  Home medications include lisinopril and hydrochlorothiazide.  Blood pressure has been on the low end of normal and intermittently soft.  Chart review indicates that her blood pressure range [id number] systolically.  She was given fluid boluses x2.  Home medications have been held.  He is asymptomatic with these blood pressures.  We will discontinue her antihypertensive meds at discharge.  Recommend close outpatient follow-up with primary care provider for evaluation of blood pressure control.  #3.  History of DVT/PE on chronic anticoagulation.  Home medications include Xarelto.Continue home meds.  Evaluated by physical therapy who recommend home health PT and 24 supervision.  Family able to do that  #  4.  Normocytic anemia.  Hemoglobin 9.5 down from 11.2 yesterday.  May be dilutional.  No sign symptoms of active bleeding.  Likely related to chronic disease.  Outpatient follow-up  #5.  Chronic kidney disease stage II.  Creatinine 1.01 on admission.  Chart review indicates creatinine has been 1.5 in the past.  Today creatinine 0.89.  Medical  Consultants:   [place] neurologoy   Discharge Exam:   Vitals:   02/25/19 1054 02/25/19 1100  BP: (!) 83/62 (!) 86/58  Pulse: 79   Resp: 18   Temp: 97.7 F (36.5 C)   SpO2: 98%    Vitals:   02/25/19 0500 02/25/19 0738 02/25/19 1054 02/25/19 1100  BP: 95/70 (!) 82/69 (!) 83/62 (!) 86/58  Pulse: 72 66 79   Resp:  19 18   Temp: 97.9 F (36.6 C) 98.3 F (36.8 C) 97.7 F (36.5 C)   TempSrc: Oral Oral    SpO2:  97% 98%     General exam: Appears calm and comfortable. Alert. Talking  Respiratory system: Clear to auscultation. Respiratory effort normal. Cardiovascular system: S1 & S2 heard, RRR. No JVD,  rubs, gallops or clicks. No murmurs. Gastrointestinal system: Abdomen is nondistended, soft and nontender. No organomegaly or masses felt. Normal bowel sounds heard. Central nervous system: Alert.  Oriented to self only no focal neurological deficits.  Moving all extremities.  Attempts to follow commands Extremities: No clubbing,  or cyanosis. No edema. Skin: No rashes, lesions or ulcers. Psychiatry: Calm cooperative   The results of significant diagnostics from this hospitalization (including imaging, microbiology, ancillary and laboratory) are listed below for reference.     Procedures and Diagnostic Studies:   EEG  Result Date: 02/23/2019 Lora Havens, MD     02/23/2019  5:28 PM Patient Name: Tammy [name] MRN: [id number] Epilepsy Attending: Lora Havens Referring Physician/Provider: Etta Quill, PA Date: 02/23/2019 Duration: 25.24 mins Patient history: 74 year old female with history of dementia presented with witnessed generalized tonic-clonic seizure-like episode.  EEG to evaluate for seizures. Level of alertness: awake AEDs during EEG study: Keppra Technical aspects: This EEG study was done with scalp electrodes positioned according to the 10-20 International system of electrode placement. Electrical activity was acquired at a sampling rate of 500Hz  and reviewed with a  high frequency filter of 70Hz  and a low frequency filter of 1Hz . EEG data were recorded continuously and digitally stored. Description: EEG showed continuous generalized mixed 6-9Hz  alpha-theta frequencies.  Hyperventilation and photic stimulation were not performed. EEG was technically difficult with significant myogenic artifact. Abnormality - Continuous slow, generalized IMPRESSION: This technically difficult study is suggestive of mild to moderate diffuse encephalopathy, non specific to etiology. No seizures or epileptiform discharges were seen throughout the recording. If concern for interictal activity remains, can consider repeat study. Lora Havens   CT Head Wo Contrast  Result Date: 02/23/2019 CLINICAL DATA:  Nontraumatic seizure EXAM: CT HEAD WITHOUT CONTRAST TECHNIQUE: Contiguous axial images were obtained from the base of the skull through the vertex without intravenous contrast. Sagittal and coronal MPR images reconstructed from axial data set. Patient uncooperative for exam. COMPARISON:  None FINDINGS: Brain: Generalized atrophy. Normal ventricular morphology. No midline shift or mass effect. Minimal small vessel chronic ischemic changes of deep cerebral white matter. Foot on appearing basal ganglia calcification bilaterally. No intracranial hemorrhage, mass lesion or evidence of acute infarction. No extra-axial fluid collections. Vascular: No hyperdense vessels. Skull: Intact Sinuses/Orbits: Clear Other: N/A IMPRESSION: Generalized atrophy with minimal small vessel  chronic ischemic changes of deep cerebral white matter. No acute intracranial abnormalities. Electronically Signed   By: [place]onia Dana M.D.   On: 02/23/2019 09:45   DG Chest Portable 1 View  Result Date: 02/23/2019 CLINICAL DATA:  Seizure. EXAM: PORTABLE CHEST 1 VIEW COMPARISON:  02/23/2016 FINDINGS: Lungs are adequately inflated with mild elevation of the right hemidiaphragm. There is no focal airspace consolidation or effusion.  No pneumothorax. Cardiomediastinal silhouette is normal. Nodular density over the left base unchanged likely the left nipple. Remainder the exam is unchanged. IMPRESSION: No acute findings. Electronically Signed   By: Marin Olp M.D.   On: 02/23/2019 12:22     Labs:   Basic Metabolic Panel: Recent Labs  Lab 02/23/19 0835 02/24/19 0232  NA 144 143  K 3.9 3.6  CL 108 110  CO2 26 24  GLUCOSE 133* 100*  BUN 21 15  CREATININE 1.01* 0.89  CALCIUM 9.8 9.0   GFR CrCl cannot be calculated (Unknown ideal weight.). Liver Function Tests: Recent Labs  Lab 02/23/19 0835  AST 19  ALT 22  ALKPHOS 53  BILITOT 0.7  PROT 7.1  ALBUMIN 3.7   No results for input(s): LIPASE, AMYLASE in the last 168 hours. No results for input(s): AMMONIA in the last 168 hours. Coagulation profile No results for input(s): INR, PROTIME in the last 168 hours.  CBC: Recent Labs  Lab 02/23/19 0835 02/24/19 0232  WBC 4.8 8.9  NEUTROABS 2.5  --   HGB 11.2* 9.5*  HCT 34.9* 28.4*  MCV 91.1 87.9  PLT 228 196   Cardiac Enzymes: No results for input(s): CKTOTAL, CKMB, CKMBINDEX, TROPONINI in the last 168 hours. BNP: Invalid input(s): POCBNP CBG: No results for input(s): GLUCAP in the last 168 hours. D-Dimer No results for input(s): DDIMER in the last 72 hours. Hgb A1c No results for input(s): HGBA1C in the last 72 hours. Lipid Profile No results for input(s): CHOL, HDL, LDLCALC, TRIG, CHOLHDL, LDLDIRECT in the last 72 hours. Thyroid function studies No results for input(s): TSH, T4TOTAL, T3FREE, THYROIDAB in the last 72 hours.  Invalid input(s): FREET3 Anemia work up No results for input(s): VITAMINB12, FOLATE, FERRITIN, TIBC, IRON, RETICCTPCT in the last 72 hours. Microbiology Recent Results (from the past 240 hour(s))  SARS CORONAVIRUS 2 (TAT 6-24 HRS) Nasopharyngeal Nasopharyngeal Swab     Status: None   Collection Time: 02/23/19  3:54 PM   Specimen: Nasopharyngeal Swab  Result Value Ref  Range Status   SARS Coronavirus 2 NEGATIVE NEGATIVE Final    Comment: (NOTE) SARS-CoV-2 target nucleic acids are NOT DETECTED. The SARS-CoV-2 RNA is generally detectable in upper and lower respiratory specimens during the acute phase of infection. Negative results do not preclude SARS-CoV-2 infection, do not rule out co-infections with other pathogens, and should not be used as the sole basis for treatment or other patient management decisions. Negative results must be combined with clinical observations, patient history, and epidemiological information. The expected result is Negative. Fact Sheet for Patients: SugarRoll.be Fact Sheet for Healthcare Providers: https://www.woods-mathews.com/ This test is not yet approved or cleared by the [place] FDA and  has been authorized for detection and/or diagnosis of SARS-CoV-2 by FDA under an Emergency Use Authorization (EUA). This EUA will remain  in effect (meaning this test can be used) for the duration of the COVID-19 declaration under Section 56 4(b)(1) of the Act, 21 U.S.C. section 360bbb-3(b)(1), unless the authorization is terminated or revoked sooner. Performed at [facility], [place]  [address] [place], [place] [address]      Discharge Instructions:   Discharge Instructions    Ambulatory referral to Neurology   Complete by: As directed    An appointment is requested in approximately 1-2 weeks   Call MD for:  persistant dizziness or light-headedness   Complete by: As directed    Call MD for:  severe uncontrolled pain   Complete by: As directed    Call MD for:  temperature >100.4   Complete by: As directed    Diet - low sodium heart healthy   Complete by: As directed    Discharge instructions   Complete by: As directed    Take medications as perscribed Follow up with Neurology 1-2 weeks for post hospital visit   Increase activity slowly   Complete by: As directed       Allergies as of 02/25/2019      Reactions   Sulfa Antibiotics Palpitations      Medication List    STOP taking these medications   lisinopril-hydrochlorothiazide 10-12.5 MG tablet Commonly known as: ZESTORETIC     TAKE these medications   donepezil 10 MG tablet Commonly known as: ARICEPT Take 10 mg by mouth at bedtime.   feeding supplement (ENSURE ENLIVE) Liqd Take 237 mLs by mouth 2 (two) times daily between meals.   levETIRAcetam 250 MG tablet Commonly known as: KEPPRA Take 1 tablet (250 mg total) by mouth 2 (two) times daily for 6 days.   levETIRAcetam 500 MG tablet Commonly known as: Keppra Take 1 tablet (500 mg total) by mouth 2 (two) times daily. Start taking on: March 04, 2019   mirtazapine 45 MG tablet Commonly known as: REMERON Take 1 tablet (45 mg total) by mouth at bedtime.   multivitamin with minerals Tabs tablet Take 1 tablet by mouth daily.   Xarelto 20 MG Tabs tablet Generic drug: rivaroxaban TAKE 1 TABLET(20 MG) BY MOUTH DAILY WITH SUPPER What changed: See the new instructions.            Durable Medical Equipment  (From admission, onward)         Start     Ordered   02/24/19 1456  For home use only DME 3 n 1  Once     02/24/19 1455         Follow-up Information    Rutherford Guys, MD.   Specialty: Family Medicine Contact information: [address] [place] [address] [id number]        [facility].   Contact information: [address]     [address] [place] [phone] [place].   Contact information: [place], [place] [phone]           Time coordinating discharge: 45 minutes  Signed:  Radene Gunning NP  [facility] 02/25/2019, 12:06 PM

## 2019-02-25 NOTE — TOC Transition Note (Signed)
Transition of Care [facility]) - CM/SW Discharge Note   Patient Details  Name: Tammy [name] MRN: [id number] Date of Birth: September 18, 1945  [facility] CM/SW Contact:  Pollie Friar, RN Phone Number: 02/25/2019, 12:50 PM   Clinical Narrative:    Pt discharging home with family and HH services through [facility]. Drew with [facility] accepted the referral and is aware of d/c home today.  3 in 1 for home delivered to the bedside per [employer].  Family to provide transport home.    Final next level of care: Home w Home Health Services Barriers to Discharge: No Barriers Identified   Patient Goals and CMS Choice   CMS Medicare.gov Compare Post Acute Care list provided to:: Patient Represenative (must comment) Choice offered to / list presented to : Adult Children  Discharge Placement                       Discharge Plan and Services                DME Arranged: 3-N-1 DME Agency: [employer] Date DME Agency Contacted: 02/24/19   Representative spoke with at DME Agency: [place]: PT, OT [place] Agency: [place] Date [place]: 02/25/19   Representative spoke with at [place]: [place] ([place]) Interventions     Readmission Risk Interventions No flowsheet data found.

## 2019-03-04 ENCOUNTER — Other Ambulatory Visit: Payer: Self-pay | Admitting: Family Medicine

## 2019-03-04 NOTE — Telephone Encounter (Signed)
Requested medications are due for refill today?  Yes  Requested medications are on active medication list?  Yes  Last Refill:  08/2018 # 90 tabs with no refills.    Future visit scheduled?  NO  Notes to Clinic:    Patient was recently hospitalized from 02/23/2019 - 02/25/2019 and instructed to follow-up with Dr. Pamella Pert on 02/25/2019 or within one week.  Patient has not followed up.  Patient's Hgb 9.5 and 28.4 dropped while in hospital down.  9 days prior the Hgb was 11.2 and and HCT 34.9.  Please review.

## 2019-03-04 NOTE — Telephone Encounter (Signed)
pts husband called stating that the pt is completely out of this medication. Please advise.

## 2019-03-09 NOTE — Telephone Encounter (Signed)
Please advise med refill. It dose look like the medication was ordered 6 months ago and according to the last ov the visit didn't talk about the Xarelto. Please advise if medication refill is appropriated.

## 2019-03-10 NOTE — Telephone Encounter (Signed)
Please schedule patient for office visit after having been in hospital. Med refilled. thanks

## 2019-03-11 ENCOUNTER — Telehealth: Payer: Self-pay | Admitting: Family Medicine

## 2019-03-11 NOTE — Telephone Encounter (Signed)
Put patient on [place]'s schedule for HFU for 03/15/19

## 2019-03-11 NOTE — Telephone Encounter (Signed)
FYI scheduled HFU for 03/15/2019

## 2019-03-11 NOTE — Telephone Encounter (Signed)
Attempted to call pt. No answer. LMTCB.   Please try to give pt a call one more time to schedule a Hos F/U visit with Dr. Pamella Pert.  She was discharged from hospital on 02/23/19.

## 2019-03-15 ENCOUNTER — Other Ambulatory Visit: Payer: Self-pay

## 2019-03-15 ENCOUNTER — Encounter: Payer: Self-pay | Admitting: Family Medicine

## 2019-03-15 ENCOUNTER — Ambulatory Visit (INDEPENDENT_AMBULATORY_CARE_PROVIDER_SITE_OTHER): Payer: Medicare Other | Admitting: Family Medicine

## 2019-03-15 VITALS — BP 138/88 | HR 66 | Temp 97.2°F | Resp 18 | Ht 66.0 in | Wt 119.0 lb

## 2019-03-15 DIAGNOSIS — I10 Essential (primary) hypertension: Secondary | ICD-10-CM

## 2019-03-15 DIAGNOSIS — F0280 Dementia in other diseases classified elsewhere without behavioral disturbance: Secondary | ICD-10-CM | POA: Diagnosis not present

## 2019-03-15 DIAGNOSIS — R569 Unspecified convulsions: Secondary | ICD-10-CM

## 2019-03-15 DIAGNOSIS — G309 Alzheimer's disease, unspecified: Secondary | ICD-10-CM

## 2019-03-15 MED ORDER — RIVAROXABAN 20 MG PO TABS: ORAL_TABLET | ORAL | 1 refills | Status: DC

## 2019-03-15 NOTE — Progress Notes (Signed)
3/2/20214:49 PM  TIEGAN KATAYAMA [birth year], 74 y.o., female [id number]  Chief Complaint  Patient presents with  . Hospitalization Follow-up    seizures    HPI:   Patient is a 74 y.o. female with past medical history significant for HTN, alzheimers, h/o VTE, CKD 2, seizures who presents today for hosp followup  Patient hosp from 2/10 - 12/2019 for new onset seizures thought to be triggered worsening alzheimer's, started on keppra 500mg  BID EEG technically difficult - diffuse encephalopathy Fu with neuro BP meds held due SBP 80-105s Discharged with home health  Patient/husband states that she has not had any more seizures She is back to her baseline Normal appetite Denies any pain or falls HH has not gone out yet They are not checking BP at home Have no acute concerns today  Lab Results  Component Value Date   CREATININE 0.89 02/24/2019   BUN 15 02/24/2019   NA 143 02/24/2019   K 3.6 02/24/2019   CL 110 02/24/2019   CO2 24 02/24/2019   CBC Latest Ref Rng & Units 02/24/2019 02/23/2019 11/20/2017  WBC 4.0 - 10.5 K/uL 8.9 4.8 7.0  Hemoglobin 12.0 - 15.0 g/dL 9.5(L) 11.2(L) 10.1(L)  Hematocrit 36.0 - 46.0 % 28.4(L) 34.9(L) 31.2(L)  Platelets 150 - 400 K/uL 196 228 357    Depression screen [facility] 2/9 08/12/2018 07/12/2018 04/19/2018  Decreased Interest 0 0 0  Down, Depressed, Hopeless 0 0 0  PHQ - 2 Score 0 0 0    Fall Risk  08/12/2018 07/12/2018 04/19/2018 12/18/2017 11/20/2017  Falls in the past year? 0 0 0 0 0  Number falls in past yr: - 0 0 - -  Injury with Fall? - 0 0 - -  Follow up Falls evaluation completed Falls evaluation completed;Education provided;Falls prevention discussed - - -     Allergies  Allergen Reactions  . Sulfa Antibiotics Palpitations    Prior to Admission medications   Medication Sig Start Date End Date Taking? Authorizing Provider  donepezil (ARICEPT) 10 MG tablet Take 10 mg by mouth at bedtime.   Yes [provider]  feeding supplement,  ENSURE ENLIVE, (ENSURE ENLIVE) LIQD Take 237 mLs by mouth 2 (two) times daily between meals. 03/08/17  Yes Hongalgi, Lenis Dickinson, MD  levETIRAcetam (KEPPRA) 250 MG tablet Take 1 tablet (250 mg total) by mouth 2 (two) times daily for 6 days. 02/25/19 03/15/19 Yes Black, Lezlie Octave, NP  levETIRAcetam (KEPPRA) 500 MG tablet Take 1 tablet (500 mg total) by mouth 2 (two) times daily. 03/04/19  Yes Black, Lezlie Octave, NP  mirtazapine (REMERON) 45 MG tablet Take 1 tablet (45 mg total) by mouth at bedtime. 04/30/17  Yes [name], MD  Multiple Vitamin (MULTIVITAMIN WITH MINERALS) TABS tablet Take 1 tablet by mouth daily.   Yes [provider]  XARELTO 20 MG TABS tablet TAKE 1 TABLET(20 MG) BY MOUTH DAILY WITH SUPPER 03/10/19  Yes Rutherford Guys, MD    Past Medical History:  Diagnosis Date  . Alzheimer's disease ([place]) 12/28/2014  . CKD (chronic kidney disease)   . Dementia ([place])   . Headache disorder 12/28/2014  . Memory disturbance 12/28/2014  . Pulmonary embolus ([place]) 02/23/2016  . Right femoral vein DVT ([place]) 02/24/2016    No past surgical history on file.  Social History   Tobacco Use  . Smoking status: Never Smoker  . Smokeless tobacco: Never Used  Substance Use Topics  . Alcohol use: No    Family  History  Problem Relation Age of Onset  . Heart disease Brother   . Heart attack Mother   . Dementia Mother     Review of Systems  Constitutional: Negative for chills and fever.  Respiratory: Negative for cough and shortness of breath.   Cardiovascular: Negative for chest pain, palpitations and leg swelling.  Gastrointestinal: Negative for abdominal pain, nausea and vomiting.     OBJECTIVE:  Today's Vitals   03/15/19 1607 03/15/19 1704  BP: (!) 144/81 138/88  Pulse: 66   Resp: 18   Temp: (!) 97.2 F (36.2 C)   TempSrc: Temporal   SpO2: 99%   Weight: 119 lb (54 kg)   Height: 5\' 6"  (1.676 m)    Body mass index is 19.21 kg/m.   Physical Exam Vitals and nursing note  reviewed.  Constitutional:      Appearance: She is well-developed.  HENT:     Head: Normocephalic and atraumatic.     Mouth/Throat:     Pharynx: No oropharyngeal exudate.  Eyes:     General: No scleral icterus.    Conjunctiva/sclera: Conjunctivae normal.     Pupils: Pupils are equal, round, and reactive to light.  Cardiovascular:     Rate and Rhythm: Normal rate and regular rhythm.     Heart sounds: Normal heart sounds. No murmur. No friction rub. No gallop.   Pulmonary:     Effort: Pulmonary effort is normal.     Breath sounds: Normal breath sounds. No wheezing or rales.  Musculoskeletal:     Cervical back: Neck supple.  Lymphadenopathy:     Cervical: No cervical adenopathy.  Skin:    General: Skin is warm and dry.  Neurological:     Mental Status: She is alert. Mental status is at baseline.     No results found for this or any previous visit (from the past 24 hour(s)).  No results found.   ASSESSMENT and PLAN  1. Seizures ([place]) On keppra, thought to be 2/2 alzheimers None since hosp  2. Essential hypertension < 140/90 off meds. Discussed home BP monitoring. Re-eval at next OV  3. Alzheimer's dementia without behavioral disturbance, unspecified timing of dementia onset ([place]) Stable. Cont on aricept  Other orders - rivaroxaban (XARELTO) 20 MG TABS tablet; TAKE 1 TABLET(20 MG) BY MOUTH DAILY WITH SUPPER  Return in about 2 weeks (around 03/29/2019).    Rutherford Guys, MD Primary Care at [place], [place] [address] Ph.  [phone] Fax [phone]

## 2019-03-15 NOTE — Patient Instructions (Addendum)
  Check BP daily, next visit telemedicine in 2 weeks   If you have lab work done today you will be contacted with your lab results within the next 2 weeks.  If you have not heard from [place] then please contact us. The fastest way to get your results is to register for My Chart.   IF you received an x-ray today, you will receive an invoice from [facility]. Please contact [facility] at [phone] with questions or concerns regarding your invoice.   IF you received labwork today, you will receive an invoice from [facility]. Please contact [facility] at [phone] with questions or concerns regarding your invoice.   Our billing staff will not be able to assist you with questions regarding bills from these companies.  You will be contacted with the lab results as soon as they are available. The fastest way to get your results is to activate your My Chart account. Instructions are located on the last page of this paperwork. If you have not heard from [place] regarding the results in 2 weeks, please contact this office.

## 2019-03-22 ENCOUNTER — Inpatient Hospital Stay: Payer: Medicare Other | Admitting: Family Medicine

## 2019-03-29 ENCOUNTER — Other Ambulatory Visit: Payer: Self-pay

## 2019-03-29 ENCOUNTER — Encounter: Payer: Medicare Other | Admitting: Family Medicine

## 2019-03-29 NOTE — Progress Notes (Signed)
Called cell phone twice and home phone once, no answer Asked patient's husband to reschedule so that we could recheck BP now that she is off BP meds  This encounter was created in error - please disregard.

## 2019-06-21 ENCOUNTER — Other Ambulatory Visit: Payer: Self-pay | Admitting: Family Medicine

## 2019-06-21 NOTE — Telephone Encounter (Signed)
Requested Prescriptions  Pending Prescriptions Disp Refills  . XARELTO 20 MG TABS tablet [Pharmacy Med Name: Alveda Reasons 20MG  TABLETS] 90 tablet 1    Sig: TAKE 1 TABLET(20 MG) BY MOUTH DAILY WITH SUPPER     Hematology: Anticoagulants - rivaroxaban Failed - 06/21/2019  9:14 AM      Failed - HCT in normal range and within 360 days    HCT  Date Value Ref Range Status  02/24/2019 28.4 (L) 36.0 - 46.0 % Final   Hematocrit  Date Value Ref Range Status  11/20/2017 31.2 (L) 34.0 - 46.6 % Final         Failed - HGB in normal range and within 360 days    Hemoglobin  Date Value Ref Range Status  02/24/2019 9.5 (L) 12.0 - 15.0 g/dL Final  11/20/2017 10.1 (L) 11.1 - 15.9 g/dL Final         Passed - ALT in normal range and within 180 days    ALT  Date Value Ref Range Status  02/23/2019 22 0 - 44 U/L Final         Passed - AST in normal range and within 180 days    AST  Date Value Ref Range Status  02/23/2019 19 15 - 41 U/L Final         Passed - Cr in normal range and within 360 days    Creat  Date Value Ref Range Status  12/19/2014 0.63 0.50 - 0.99 mg/dL Final   Creatinine, Ser  Date Value Ref Range Status  02/24/2019 0.89 0.44 - 1.00 mg/dL Final         Passed - PLT in normal range and within 360 days    Platelets  Date Value Ref Range Status  02/24/2019 196 150 - 400 K/uL Final  11/20/2017 357 150 - 450 x10E3/uL Final   Platelet Count, POC  Date Value Ref Range Status  03/16/2017 296 142 - 424 K/uL Final         Passed - Valid encounter within last 12 months    Recent Outpatient Visits          3 months ago Seizures [facility])   [facility], Lilia Argue, MD   10 months ago Transient hypotension   Primary Care at [facility], Ines Bloomer, MD   11 months ago Medicare annual wellness visit, subsequent   Primary Care at [facility], Arlie Solomons, MD   1 year ago Essential hypertension, benign   [facility], Lilia Argue, MD   1 year ago  Essential hypertension, benign   [facility], Lilia Argue, MD

## 2019-08-17 ENCOUNTER — Telehealth: Payer: Self-pay | Admitting: Family Medicine

## 2019-08-17 NOTE — Telephone Encounter (Signed)
Pt needs refill on pt states wife had a seizure today   What is the name of the medication? levETIRAcetam (KEPPRA) 500 MG tablet    Have you contacted your pharmacy to request a refill? Y  Which pharmacy would you like this sent to?  [facility] [place], [address]  [place], [place] [address]  Phone:  [phone] Fax:  [phone]    Patient notified that their request is being sent to the clinical staff for review and that they should receive a call once it is complete. If they do not receive a call within 72 hours they can check with their pharmacy or our office.

## 2019-08-17 NOTE — Telephone Encounter (Signed)
Pt was advised to have a 2 week F/U visit with you from March 20201.  Is it ok to refill medication.   Please Advise

## 2019-08-18 MED ORDER — LEVETIRACETAM 500 MG PO TABS: 500.0000 mg | ORAL_TABLET | Freq: Two times a day (BID) | ORAL | 1 refills | Status: AC

## 2020-01-26 ENCOUNTER — Telehealth: Payer: Self-pay

## 2020-01-26 NOTE — Telephone Encounter (Signed)
Volunteer phone call made with message received that patient is doing ok but requested a nurse to call. Phone call placed to patient. VM left with purpose of call and call back information

## 2020-04-24 ENCOUNTER — Emergency Department (HOSPITAL_COMMUNITY): Payer: Medicare Other

## 2020-04-24 ENCOUNTER — Other Ambulatory Visit: Payer: Self-pay

## 2020-04-24 ENCOUNTER — Inpatient Hospital Stay (HOSPITAL_COMMUNITY)
Admission: EM | Admit: 2020-04-24 | Discharge: 2020-04-27 | DRG: 593 | Disposition: A | Discharge status: Hospice | Payer: Medicare Other | Attending: Family Medicine | Admitting: Family Medicine

## 2020-04-24 ENCOUNTER — Encounter (HOSPITAL_COMMUNITY): Payer: Self-pay

## 2020-04-24 DIAGNOSIS — Z79899 Other long term (current) drug therapy: Secondary | ICD-10-CM

## 2020-04-24 DIAGNOSIS — G40909 Epilepsy, unspecified, not intractable, without status epilepticus: Secondary | ICD-10-CM | POA: Diagnosis present

## 2020-04-24 DIAGNOSIS — R5381 Other malaise: Secondary | ICD-10-CM | POA: Diagnosis not present

## 2020-04-24 DIAGNOSIS — M24561 Contracture, right knee: Secondary | ICD-10-CM | POA: Diagnosis not present

## 2020-04-24 DIAGNOSIS — Z86718 Personal history of other venous thrombosis and embolism: Secondary | ICD-10-CM

## 2020-04-24 DIAGNOSIS — R569 Unspecified convulsions: Secondary | ICD-10-CM

## 2020-04-24 DIAGNOSIS — M24562 Contracture, left knee: Secondary | ICD-10-CM | POA: Diagnosis present

## 2020-04-24 DIAGNOSIS — L089 Local infection of the skin and subcutaneous tissue, unspecified: Secondary | ICD-10-CM

## 2020-04-24 DIAGNOSIS — I10 Essential (primary) hypertension: Secondary | ICD-10-CM

## 2020-04-24 DIAGNOSIS — Z86711 Personal history of pulmonary embolism: Secondary | ICD-10-CM

## 2020-04-24 DIAGNOSIS — N182 Chronic kidney disease, stage 2 (mild): Secondary | ICD-10-CM | POA: Diagnosis present

## 2020-04-24 DIAGNOSIS — Z66 Do not resuscitate: Secondary | ICD-10-CM | POA: Diagnosis not present

## 2020-04-24 DIAGNOSIS — L89220 Pressure ulcer of left hip, unstageable: Secondary | ICD-10-CM | POA: Diagnosis not present

## 2020-04-24 DIAGNOSIS — R531 Weakness: Secondary | ICD-10-CM | POA: Diagnosis not present

## 2020-04-24 DIAGNOSIS — L8990 Pressure ulcer of unspecified site, unspecified stage: Secondary | ICD-10-CM | POA: Diagnosis present

## 2020-04-24 DIAGNOSIS — G309 Alzheimer's disease, unspecified: Secondary | ICD-10-CM | POA: Diagnosis present

## 2020-04-24 DIAGNOSIS — M868X5 Other osteomyelitis, thigh: Secondary | ICD-10-CM | POA: Diagnosis not present

## 2020-04-24 DIAGNOSIS — Z743 Need for continuous supervision: Secondary | ICD-10-CM | POA: Diagnosis not present

## 2020-04-24 DIAGNOSIS — N189 Chronic kidney disease, unspecified: Secondary | ICD-10-CM

## 2020-04-24 DIAGNOSIS — N179 Acute kidney failure, unspecified: Secondary | ICD-10-CM | POA: Diagnosis not present

## 2020-04-24 DIAGNOSIS — L89216 Pressure-induced deep tissue damage of right hip: Secondary | ICD-10-CM

## 2020-04-24 DIAGNOSIS — Z515 Encounter for palliative care: Secondary | ICD-10-CM | POA: Diagnosis not present

## 2020-04-24 DIAGNOSIS — M25551 Pain in right hip: Secondary | ICD-10-CM | POA: Diagnosis not present

## 2020-04-24 DIAGNOSIS — L89204 Pressure ulcer of unspecified hip, stage 4: Secondary | ICD-10-CM | POA: Diagnosis not present

## 2020-04-24 DIAGNOSIS — I129 Hypertensive chronic kidney disease with stage 1 through stage 4 chronic kidney disease, or unspecified chronic kidney disease: Secondary | ICD-10-CM | POA: Diagnosis not present

## 2020-04-24 DIAGNOSIS — Z8249 Family history of ischemic heart disease and other diseases of the circulatory system: Secondary | ICD-10-CM | POA: Diagnosis not present

## 2020-04-24 DIAGNOSIS — Z7189 Other specified counseling: Secondary | ICD-10-CM | POA: Diagnosis not present

## 2020-04-24 DIAGNOSIS — L89219 Pressure ulcer of right hip, unspecified stage: Secondary | ICD-10-CM | POA: Diagnosis not present

## 2020-04-24 DIAGNOSIS — L89896 Pressure-induced deep tissue damage of other site: Secondary | ICD-10-CM | POA: Diagnosis present

## 2020-04-24 DIAGNOSIS — Z7401 Bed confinement status: Secondary | ICD-10-CM | POA: Diagnosis not present

## 2020-04-24 DIAGNOSIS — Z20822 Contact with and (suspected) exposure to covid-19: Secondary | ICD-10-CM | POA: Diagnosis not present

## 2020-04-24 DIAGNOSIS — R279 Unspecified lack of coordination: Secondary | ICD-10-CM | POA: Diagnosis not present

## 2020-04-24 DIAGNOSIS — L89210 Pressure ulcer of right hip, unstageable: Principal | ICD-10-CM | POA: Diagnosis present

## 2020-04-24 DIAGNOSIS — F0280 Dementia in other diseases classified elsewhere without behavioral disturbance: Secondary | ICD-10-CM | POA: Diagnosis present

## 2020-04-24 DIAGNOSIS — L89513 Pressure ulcer of right ankle, stage 3: Secondary | ICD-10-CM | POA: Diagnosis not present

## 2020-04-24 DIAGNOSIS — L98499 Non-pressure chronic ulcer of skin of other sites with unspecified severity: Secondary | ICD-10-CM | POA: Diagnosis not present

## 2020-04-24 DIAGNOSIS — L89226 Pressure-induced deep tissue damage of left hip: Secondary | ICD-10-CM

## 2020-04-24 DIAGNOSIS — L89229 Pressure ulcer of left hip, unspecified stage: Secondary | ICD-10-CM | POA: Diagnosis not present

## 2020-04-24 LAB — CBC WITH DIFFERENTIAL/PLATELET
Abs Immature Granulocytes: 0.1 10*3/uL — ABNORMAL HIGH (ref 0.00–0.07)
Basophils Absolute: 0.1 10*3/uL (ref 0.0–0.1)
Basophils Relative: 1 %
Eosinophils Absolute: 0.1 10*3/uL (ref 0.0–0.5)
Eosinophils Relative: 1 %
HCT: 35.6 % — ABNORMAL LOW (ref 36.0–46.0)
Hemoglobin: 10.8 g/dL — ABNORMAL LOW (ref 12.0–15.0)
Immature Granulocytes: 1 %
Lymphocytes Relative: 12 %
Lymphs Abs: 1.4 10*3/uL (ref 0.7–4.0)
MCH: 29.3 pg (ref 26.0–34.0)
MCHC: 30.3 g/dL (ref 30.0–36.0)
MCV: 96.7 fL (ref 80.0–100.0)
Monocytes Absolute: 0.6 10*3/uL (ref 0.1–1.0)
Monocytes Relative: 5 %
Neutro Abs: 9.7 10*3/uL — ABNORMAL HIGH (ref 1.7–7.7)
Neutrophils Relative %: 80 %
Platelets: 435 10*3/uL — ABNORMAL HIGH (ref 150–400)
RBC: 3.68 MIL/uL — ABNORMAL LOW (ref 3.87–5.11)
RDW: 15.8 % — ABNORMAL HIGH (ref 11.5–15.5)
WBC: 12 10*3/uL — ABNORMAL HIGH (ref 4.0–10.5)
nRBC: 0 % (ref 0.0–0.2)

## 2020-04-24 LAB — COMPREHENSIVE METABOLIC PANEL
ALT: 21 U/L (ref 0–44)
AST: 27 U/L (ref 15–41)
Albumin: 2.9 g/dL — ABNORMAL LOW (ref 3.5–5.0)
Alkaline Phosphatase: 73 U/L (ref 38–126)
Anion gap: 9 (ref 5–15)
BUN: 33 mg/dL — ABNORMAL HIGH (ref 8–23)
CO2: 23 mmol/L (ref 22–32)
Calcium: 9.2 mg/dL (ref 8.9–10.3)
Chloride: 119 mmol/L — ABNORMAL HIGH (ref 98–111)
Creatinine, Ser: 1.1 mg/dL — ABNORMAL HIGH (ref 0.44–1.00)
GFR, Estimated: 53 mL/min — ABNORMAL LOW (ref >60)
Glucose, Bld: 107 mg/dL — ABNORMAL HIGH (ref 70–99)
Potassium: 4.2 mmol/L (ref 3.5–5.1)
Sodium: 151 mmol/L — ABNORMAL HIGH (ref 135–145)
Total Bilirubin: 0.4 mg/dL (ref 0.3–1.2)
Total Protein: 7.6 g/dL (ref 6.5–8.1)

## 2020-04-24 LAB — C-REACTIVE PROTEIN: CRP: 11.9 mg/dL — ABNORMAL HIGH (ref <1.0)

## 2020-04-24 LAB — SEDIMENTATION RATE: Sed Rate: 119 mm/hr — ABNORMAL HIGH (ref 0–22)

## 2020-04-24 LAB — LACTIC ACID, PLASMA
Lactic Acid, Venous: 2.1 mmol/L (ref 0.5–1.9)
Lactic Acid, Venous: 1.3 mmol/L (ref 0.5–1.9)

## 2020-04-24 MED ORDER — VANCOMYCIN HCL IN DEXTROSE 1-5 GM/200ML-% IV SOLN
1000.0000 mg | Freq: Once | INTRAVENOUS | Status: AC
  Administered 2020-04-24: 1000 mg via INTRAVENOUS
  Filled 2020-04-24: qty 200

## 2020-04-24 MED ORDER — VANCOMYCIN HCL IN DEXTROSE 1-5 GM/200ML-% IV SOLN: 1000.0000 mg | INTRAVENOUS | Status: DC

## 2020-04-24 MED ORDER — SODIUM CHLORIDE 0.9 % IV BOLUS
500.0000 mL | Freq: Once | INTRAVENOUS | Status: AC
  Administered 2020-04-24: 500 mL via INTRAVENOUS

## 2020-04-24 MED ORDER — SODIUM CHLORIDE 0.9 % IV BOLUS
30.0000 mL/kg | Freq: Once | INTRAVENOUS | Status: AC
  Administered 2020-04-24: 1605 mL via INTRAVENOUS

## 2020-04-24 MED ORDER — ADULT MULTIVITAMIN W/MINERALS CH
1.0000 | ORAL_TABLET | Freq: Every day | ORAL | Status: DC
  Administered 2020-04-25 – 2020-04-27 (×4): 1 via ORAL
  Filled 2020-04-24 (×4): qty 1

## 2020-04-24 MED ORDER — LEVETIRACETAM 500 MG PO TABS
500.0000 mg | ORAL_TABLET | Freq: Two times a day (BID) | ORAL | Status: DC
  Administered 2020-04-25 – 2020-04-27 (×6): 500 mg via ORAL
  Filled 2020-04-24 (×7): qty 1

## 2020-04-24 MED ORDER — ONDANSETRON HCL 4 MG/2ML IJ SOLN
4.0000 mg | Freq: Once | INTRAMUSCULAR | Status: AC
  Administered 2020-04-24: 4 mg via INTRAVENOUS
  Filled 2020-04-24: qty 2

## 2020-04-24 MED ORDER — FENTANYL CITRATE (PF) 100 MCG/2ML IJ SOLN
50.0000 ug | Freq: Once | INTRAMUSCULAR | Status: AC
  Administered 2020-04-24: 50 ug via INTRAVENOUS
  Filled 2020-04-24: qty 2

## 2020-04-24 NOTE — Progress Notes (Signed)
Pharmacy Antibiotic Note  Tammy [name] is a 75 y.o. female admitted on 04/24/2020 with bilateral trochanteric stage 3/4 decubitus ulcers  Pharmacy has been consulted for vancomycin dosing. X ray of the left hip shows questionable early osteomyelitis MRI of the hip pending likely will need ortho consult in the morning wound care consult   Plan: Vancomycin 1000 mg IV q36 for est AUC 476.4 using SCr 1.1 & VD 0.72 F/u renal function, WBC, temp, culture report Vancomycin levels as needed  Height: 5\' 2"  (157.5 cm) Weight: 53.5 kg (118 lb) IBW/kg (Calculated) : 50.1  Temp (24hrs), Avg:97.3 F (36.3 C), Min:96.6 F (35.9 C), Max:98 F (36.7 C)  Recent Labs  Lab 04/24/20 1518 04/24/20 1642 04/24/20 1752  WBC 12.0*  --   --   CREATININE 1.10*  --   --   LATICACIDVEN  --  2.1* 1.3    Estimated Creatinine Clearance: 36 mL/min (A) (by C-G formula based on SCr of 1.1 mg/dL (H)).    Allergies  Allergen Reactions  . Sulfa Antibiotics Palpitations    Antimicrobials this admission: 4/12 vancomycin>> Dose adjustments this admission:  Microbiology results: 4/12 BCx2: 4/12 covid:   Thank you for allowing pharmacy to be a part of this patient's care.  Eudelia Bunch, Pharm.D 04/24/2020 8:31 PM

## 2020-04-24 NOTE — ED Triage Notes (Signed)
Emergency Medicine Provider Triage Evaluation Note  PAVIELLE BIGGAR , a 75 y.o. female  was evaluated in triage.  Pt complains of right ulceration to the right hip. Husband providing history that patient reports pain with movement, pt is non ambulatory at baseline. Hx of dementia. No trauma, no fever.   Review of Systems  Positive: Right hip pain Negative: Fever, trauma, urinary symptoms  Physical Exam  BP (!) 118/102   Pulse (!) 108   Temp 98 F (36.7 C) (Oral)   Resp 15   Ht 5\' 2"  (1.575 m)   Wt 53.5 kg   SpO2 100%   BMI 21.58 kg/m  Gen:   Awake, at baseline per husband HEENT:  Atraumatic  Resp:  Normal effort  Cardiac:  Normal rate  Abd:   Nondistended, nontender  MSK:   In wheel chair, pain with palpation of right hip Neuro:  Limited verbal  Medical Decision Making  Medically screening exam initiated at 2:19 PM.  Appropriate orders placed.  Saphia Vanderford Goodpasture was informed that the remainder of the evaluation will be completed by another provider, this initial triage assessment does not replace that evaluation, and the importance of remaining in the ED until their evaluation is complete.  Clinical Impression  Patient here with husband who provides mus history for right hip ulceration which has been there for ~ 3 weeks. No trauma, no fever, no abdominal or urinary symptoms.    Janeece Fitting, PA-C 04/24/20 1422

## 2020-04-24 NOTE — ED Notes (Signed)
Patient has wounds on bilateral hips. Patient also has wounds on bilateral feet. Patiens gowns and linens were changed. Patient wounds bilateral hip wounds were dressed with a nonadherent dressing. Patient is on a purewick. Pillows and gowns were used for comfort.  Patient was seen by the hospitals.

## 2020-04-24 NOTE — ED Notes (Signed)
Pt has low BP at baseline (~80-90 sys.)

## 2020-04-24 NOTE — ED Notes (Signed)
Patient is unable to tolerate MRI due to contracted condition.

## 2020-04-24 NOTE — ED Notes (Signed)
Patient was repositioned with pillows and blankets.

## 2020-04-24 NOTE — ED Provider Notes (Signed)
[facility] DEPT Provider Note   CSN: [id number] Arrival date & time: 04/24/20  1344     History Chief Complaint  Patient presents with  . Skin Ulcer  . Hip Pain    Tammy [name] is a 75 y.o. female.  The history is provided by the spouse. History limited by: dementia.  Rash Location: both hips. Quality: painful   Pain details:    Quality:  Unable to specify   Severity:  Severe   Onset quality:  Gradual   Timing:  Constant   Progression:  Worsening Severity:  Severe Onset quality:  Gradual Timing:  Constant Progression:  Worsening Chronicity:  New Context comment:  No trauma, but the patient is basically bed bound Relieved by:  Nothing Worsened by:  Nothing Ineffective treatments:  Antibiotic cream Associated symptoms: no abdominal pain, no fever, no joint pain, no shortness of breath, no sore throat and not vomiting        Past Medical History:  Diagnosis Date  . Alzheimer's disease ([place]) 12/28/2014  . CKD (chronic kidney disease)   . Dementia ([place])   . Headache disorder 12/28/2014  . Memory disturbance 12/28/2014  . Pulmonary embolus ([place]) 02/23/2016  . Right femoral vein DVT ([place]) 02/24/2016    Patient Active Problem List   Diagnosis Date Noted  . CKD (chronic kidney disease)   . Seizure ([place]) 02/23/2019  . 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 Unintentional weight loss of more than 10 pounds in 90 days 10/19/2017  . Loss of appetite 07/08/2017  . Advanced care planning/counseling discussion 07/08/2017  . Acute pulmonary embolism ([place]) 03/07/2017  . Acute DVT (deep venous thrombosis) ([place]) 03/07/2017  . Normocytic anemia 03/07/2017  . History of deep vein thrombosis (DVT) of lower extremity 03/06/2017  . History of pulmonary embolism 03/06/2017  . Unintentional weight loss of more than 5% body weight within 1 month 02/23/2017  . Essential hypertension 12/31/2016  . Pelvic mass in female 03/11/2016  .  Ovarian mass 02/24/2016  . Vitamin D deficiency 01/02/2015  . Prediabetes 01/02/2015  . Memory disturbance 12/28/2014  . Alzheimer's disease ([place]) 12/28/2014    History reviewed. No pertinent surgical history.   OB History    Gravida  3   Para      Term      Preterm      AB  0   Living  3     SAB      IAB      Ectopic  0   Multiple      Live Births              Family History  Problem Relation Age of Onset  . Heart disease Brother   . Heart attack Mother   . Dementia Mother     Social History   Tobacco Use  . Smoking status: Never Smoker  . Smokeless tobacco: Never Used  Vaping Use  . Vaping Use: Never used  Substance Use Topics  . Alcohol use: No  . Drug use: No    Home Medications Prior to Admission medications   Medication Sig Start Date End Date Taking? Authorizing Provider  donepezil (ARICEPT) 10 MG tablet Take 10 mg by mouth at bedtime.    [provider]  feeding supplement, ENSURE ENLIVE, (ENSURE ENLIVE) LIQD Take 237 mLs by mouth 2 (two) times daily between meals. 03/08/17   Modena Jansky, MD  levETIRAcetam (KEPPRA) 500 MG tablet Take  1 tablet (500 mg total) by mouth 2 (two) times daily. 08/18/19   Daleen Squibb, MD  mirtazapine (REMERON) 45 MG tablet Take 1 tablet (45 mg total) by mouth at bedtime. 04/30/17   [name], MD  Multiple Vitamin (MULTIVITAMIN WITH MINERALS) TABS tablet Take 1 tablet by mouth daily.    [provider]  XARELTO 20 MG TABS tablet TAKE 1 TABLET(20 MG) BY MOUTH DAILY WITH SUPPER 06/21/19   Jacelyn Pi, Lilia Argue, MD    Allergies    Sulfa antibiotics  Review of Systems   Review of Systems  Constitutional: Negative for chills and fever.  HENT: Negative for ear pain and sore throat.   Eyes: Negative for pain and visual disturbance.  Respiratory: Negative for cough and shortness of breath.   Cardiovascular: Negative for chest pain and palpitations.  Gastrointestinal: Negative for  abdominal pain and vomiting.  Genitourinary: Negative for dysuria and hematuria.  Musculoskeletal: Negative for arthralgias and back pain.  Skin: Positive for wound. Negative for color change and rash.  Neurological: Negative for seizures and syncope.  All other systems reviewed and are negative.   Physical Exam Updated Vital Signs BP (!) 118/102   Pulse (!) 108   Temp 98 F (36.7 C) (Oral)   Resp 15   Ht 5\' 2"  (1.575 m)   Wt 53.5 kg   SpO2 100%   BMI 21.58 kg/m   Physical Exam Vitals and nursing note reviewed.  Constitutional:      Comments: Alert, appears to be in pain  HENT:     Head: Normocephalic and atraumatic.  Eyes:     General: No scleral icterus. Pulmonary:     Effort: Pulmonary effort is normal. No respiratory distress.  Musculoskeletal:        General: No deformity or signs of injury.     Cervical back: Normal range of motion.  Skin:    General: Skin is warm and dry.     Comments: The patient has large ulcers at both greater trochanters.  The right is slightly worse than the left and appears to be of greater depth.  It is at least 6 cm x 5 cm.  The left side is also large, 3 x 4 cm.  No exposed bone over either.  Patient has very little subcutaneous fat deposits.  Limbs are warm and well perfused.  Neurological:     General: No focal deficit present.     Mental Status: She is alert.         ED Results / Procedures / Treatments   Labs (all labs ordered are listed, but only abnormal results are displayed) Labs Reviewed  CBC WITH DIFFERENTIAL/PLATELET - Abnormal; Notable for the following components:      Result Value   WBC 12.0 (*)    RBC 3.68 (*)    Hemoglobin 10.8 (*)    HCT 35.6 (*)    RDW 15.8 (*)    Platelets 435 (*)    Neutro Abs 9.7 (*)    Abs Immature Granulocytes 0.10 (*)    All other components within normal limits  COMPREHENSIVE METABOLIC PANEL - Abnormal; Notable for the following components:   Sodium 151 (*)    Chloride 119 (*)     Glucose, Bld 107 (*)    BUN 33 (*)    Creatinine, Ser 1.10 (*)    Albumin 2.9 (*)    GFR, Estimated 53 (*)    All other components within normal  limits  LACTIC ACID, PLASMA - Abnormal; Notable for the following components:   Lactic Acid, Venous 2.1 (*)    All other components within normal limits  SEDIMENTATION RATE - Abnormal; Notable for the following components:   Sed Rate 119 (*)    All other components within normal limits  C-REACTIVE PROTEIN - Abnormal; Notable for the following components:   CRP 11.9 (*)    All other components within normal limits  CULTURE, BLOOD (ROUTINE X 2)  CULTURE, BLOOD (ROUTINE X 2)  SARS CORONAVIRUS 2 (TAT 6-24 HRS)  LACTIC ACID, PLASMA    EKG None  Radiology DG Hip [place] or [place] Pelvis 1 View Right  Result Date: 04/24/2020 CLINICAL DATA:  Right hip pain and cutaneous sore. EXAM: DG HIP (WITH OR WITHOUT PELVIS) 1V PORT RIGHT COMPARISON:  Abdomen and pelvis CT dated 11/09/2016. FINDINGS: Normal appearing right hip. No visible soft tissue abnormality. Large amount of stool in the rectum. Moderate stool in the remainder of the included portions of the colon. Mild lower lumbar spine degenerative changes. IMPRESSION: 1. Normal appearing right hip. 2. Large amount of stool in the rectum. Electronically Signed   By: Claudie Revering M.D.   On: 04/24/2020 15:04    Procedures Procedures   Medications Ordered in ED Medications  sodium chloride 0.9 % bolus 500 mL (has no administration in time range)  fentaNYL (SUBLIMAZE) injection 50 mcg (has no administration in time range)  ondansetron (ZOFRAN) injection 4 mg (has no administration in time range)    ED Course  I have reviewed the triage vital signs and the nursing notes.  Pertinent labs & imaging results that were available during my care of the patient were reviewed by me and considered in my medical decision making (see chart for details).  Clinical Course as of 04/24/20 1910  Tue Apr 24, 2020  1908 I spoke with [facility] in regards to the patient being admitted. [AW]    Clinical Course User Index [AW] Arnaldo Natal, MD   MDM Rules/Calculators/A&P                          Jerelene Redden presents with her husband.  He has noticed wounds over both hips that have been present for weeks.  They have been getting progressively more painful, and it has become noticeable that the patient is in pain when she has been changed.  The wounds look deep and have had an adequate wound care.  The patient was noted to be tachycardic, and she has an elevated lactic acid as well as elevated white count and sed rate.  Plain x-rays were negative, and MRI is pending.  She was given vancomycin and IV fluids here in the ED.  She will be admitted to the hospital for further management. Final Clinical Impression(s) / ED Diagnoses Final diagnoses:  Pressure injury of deep tissue of right hip  Pressure injury of deep tissue of left hip  Wound infection    Rx / DC Orders ED Discharge Orders    None       Arnaldo Natal, MD 04/24/20 1910

## 2020-04-24 NOTE — ED Triage Notes (Signed)
Patient lives at home with her family. Patient has a decubitus to the right hip.. patient has a band-aid to he area. Patient's husband states she is constantly scratching the area. Patient has a history of dementia.

## 2020-04-24 NOTE — H&P (Signed)
History and Physical    Tammy [name] TDV:[id number] DOB: April 07, 1945 DOA: 04/24/2020  PCP: Jacelyn Pi, Lilia Argue, MD  Patient coming from: Home  I have personally briefly reviewed patient's old medical records in [place]  Chief Complaint:   HPI: Tammy [name] is a 75 y.o. female with medical history significant for Alzheimer's nonverbal at baseline, PE/DVT on Xarelto, hypertension, seizure on Keppra and CKD stage II who presents for concerns of right hip ulceration.  Patient is nonverbal baseline.  No family at bedside.  Per chart review, she was initially brought here by her husband for concerns of right hip ulceration that has been there for 3 weeks.  Denies any fever.  Any family members have been putting Neosporin and Band-Aid on it.  In the ED, afebrile initially tachycardic up to 108 and normotensive on room air.  CBC shows leukocytosis of 12.  Lactate of 2.1.  Hemoglobin of 10.8 which is her baseline.  Mild elevated platelet of 435.  Sodium of 151.  Creatinine of 1.01 from prior of 0.89.  Review of Systems: Unable to obtain ROS due to patient's dementia and nonverbal at baseline  Past Medical History:  Diagnosis Date  . Alzheimer's disease ([place]) 12/28/2014  . CKD (chronic kidney disease)   . Dementia ([place])   . Headache disorder 12/28/2014  . Memory disturbance 12/28/2014  . Pulmonary embolus ([place]) 02/23/2016  . Right femoral vein DVT ([place]) 02/24/2016    History reviewed. No pertinent surgical history.   reports that she has never smoked. She has never used smokeless tobacco. She reports that she does not drink alcohol and does not use drugs. Social History  Allergies  Allergen Reactions  . Sulfa Antibiotics Palpitations    Family History  Problem Relation Age of Onset  . Heart disease Brother   . Heart attack Mother   . Dementia Mother      Prior to Admission medications   Medication Sig Start Date End Date Taking? Authorizing Provider  levETIRAcetam  (KEPPRA) 500 MG tablet Take 1 tablet (500 mg total) by mouth 2 (two) times daily. 08/18/19  Yes Jacelyn Pi, Lilia Argue, MD  Multiple Vitamin (MULTIVITAMIN WITH MINERALS) TABS tablet Take 1 tablet by mouth daily.   Yes [provider]  XARELTO 20 MG TABS tablet TAKE 1 TABLET(20 MG) BY MOUTH DAILY WITH SUPPER 06/21/19  Yes Jacelyn Pi, Irma M, MD  feeding supplement, ENSURE ENLIVE, (ENSURE ENLIVE) LIQD Take 237 mLs by mouth 2 (two) times daily between meals. Patient not taking: Reported on 04/24/2020 03/08/17   Modena Jansky, MD  mirtazapine (REMERON) 45 MG tablet Take 1 tablet (45 mg total) by mouth at bedtime. Patient not taking: Reported on 04/24/2020 04/30/17   [name], MD    Physical Exam: Vitals:   04/24/20 1404 04/24/20 1557 04/24/20 1746 04/24/20 1900  BP:  (!) 83/69 (!) 131/115 132/68  Pulse:  74 73 72  Resp:  12 10 12   Temp:  (!) 96.6 F (35.9 C)    TempSrc:  Axillary    SpO2:  100% 100% 100%  Weight: 53.5 kg     Height: 5\' 2"  (1.575 m)       Constitutional: NAD, calm, elderly demented female nonverbal baseline laying on left side in bed Vitals:   04/24/20 1404 04/24/20 1557 04/24/20 1746 04/24/20 1900  BP:  (!) 83/69 (!) 131/115 132/68  Pulse:  74 73 72  Resp:  12 10 12   Temp:  Marland Kitchen)  96.6 F (35.9 C)    TempSrc:  Axillary    SpO2:  100% 100% 100%  Weight: 53.5 kg     Height: 5\' 2"  (1.575 m)      Eyes: PERRL, lids and conjunctivae normal ENMT: Mucous membranes are moist.  Neck: normal, supple Respiratory: clear to auscultation bilaterally, no wheezing, no crackles. Normal respiratory effort. No accessory muscle use.  Cardiovascular: Regular rate and rhythm, no murmurs / rubs / gallops. No extremity edema.   Abdomen: no tenderness, no masses palpated.Bowel sounds positive.  GU: Has on adult diapers  Musculoskeletal: no clubbing / cyanosis.UE and LE muscle wasting with bilateral knee flexed and contracted with inability to be extended with passive range of  motion.  Large bilateral trochanteric decubitus ulcer down to subcutaneous fatty tissue. necrotic tissue seen on left trochanteric ulcer. Pt screamed in pain with movement to evaluate her ulcers.       Skin: ulcer as described above Neurologic: Alert but nonverbal at baseline. Can track with her eyes. Unable to follow commands Psychiatric: Demented and nonverbal     Labs on Admission: I have personally reviewed following labs and imaging studies  CBC: Recent Labs  Lab 04/24/20 1518  WBC 12.0*  NEUTROABS 9.7*  HGB 10.8*  HCT 35.6*  MCV 96.7  PLT 160*   Basic Metabolic Panel: Recent Labs  Lab 04/24/20 1518  NA 151*  K 4.2  CL 119*  CO2 23  GLUCOSE 107*  BUN 33*  CREATININE 1.10*  CALCIUM 9.2   GFR: Estimated Creatinine Clearance: 36 mL/min (A) (by C-G formula based on SCr of 1.1 mg/dL (H)). Liver Function Tests: Recent Labs  Lab 04/24/20 1518  AST 27  ALT 21  ALKPHOS 73  BILITOT 0.4  PROT 7.6  ALBUMIN 2.9*   No results for input(s): LIPASE, AMYLASE in the last 168 hours. No results for input(s): AMMONIA in the last 168 hours. Coagulation Profile: No results for input(s): INR, PROTIME in the last 168 hours. Cardiac Enzymes: No results for input(s): CKTOTAL, CKMB, CKMBINDEX, TROPONINI in the last 168 hours. BNP (last 3 results) No results for input(s): PROBNP in the last 8760 hours. HbA1C: No results for input(s): HGBA1C in the last 72 hours. CBG: No results for input(s): GLUCAP in the last 168 hours. Lipid Profile: No results for input(s): CHOL, HDL, LDLCALC, TRIG, CHOLHDL, LDLDIRECT in the last 72 hours. Thyroid Function Tests: No results for input(s): TSH, T4TOTAL, FREET4, T3FREE, THYROIDAB in the last 72 hours. Anemia Panel: No results for input(s): VITAMINB12, FOLATE, FERRITIN, TIBC, IRON, RETICCTPCT in the last 72 hours. Urine analysis:    Component Value Date/Time   COLORURINE YELLOW 02/23/2019 1035   APPEARANCEUR HAZY (A) 02/23/2019 1035    LABSPEC 1.023 02/23/2019 1035   PHURINE 5.0 02/23/2019 1035   GLUCOSEU NEGATIVE 02/23/2019 1035   HGBUR NEGATIVE 02/23/2019 [place] 02/23/2019 1035   BILIRUBINUR negative 04/30/2017 [place] 02/23/2019 1035   PROTEINUR NEGATIVE 02/23/2019 1035   UROBILINOGEN 0.2 04/30/2017 1211   NITRITE NEGATIVE 02/23/2019 1035   LEUKOCYTESUR NEGATIVE 02/23/2019 1035    Radiological Exams on Admission: DG Hip Port Unilat W or Wo Pelvis 1 View Right  Result Date: 04/24/2020 CLINICAL DATA:  Right hip pain and cutaneous sore. EXAM: DG HIP (WITH OR WITHOUT PELVIS) 1V PORT RIGHT COMPARISON:  Abdomen and pelvis CT dated 11/09/2016. FINDINGS: Normal appearing right hip. No visible soft tissue abnormality. Large amount of stool in the rectum. Moderate stool  in the remainder of the included portions of the colon. Mild lower lumbar spine degenerative changes. IMPRESSION: 1. Normal appearing right hip. 2. Large amount of stool in the rectum. Electronically Signed   By: Claudie Revering M.D.   On: 04/24/2020 15:04   DG Hip Unilat W or Wo Pelvis 2-3 Views Left  Result Date: 04/24/2020 CLINICAL DATA:  Skin ulcer over greater trochanter EXAM: DG HIP (WITH OR WITHOUT PELVIS) 2-3V LEFT COMPARISON:  09/02/2007 FINDINGS: No fracture or malalignment. Slightly indistinct appearance of the cortex at the trochanter. No frank bony destructive change. No soft tissue emphysema. IMPRESSION: Slightly indistinct appearance of the cortex at the trochanter questionable for early osteomyelitis change. Electronically Signed   By: Donavan Foil M.D.   On: 04/24/2020 17:30      Assessment/Plan  Bilateral trochanteric stage 3/4 decubitus ulcer Continue IV Vancomycin  X ray of the left hip shows questionable early osteomyelitis MRI of the hip pending likely will need ortho consult in the morning wound care consult   AKI on CKD 2 Has received about 2L NS in ED  Follow with repeat lab in  morning  Hx of DVT/PE Hold Xarelto as she likely will need at minimum will  need debridement of her bilateral decubitus ulcers  HTN  Not on antihypertensives.  Had one recording of hypotension-unsure if that was in error but she did receive IV fluids with normal BP readings afterwards   Hx of Seizures  continue Keppra   Dementia pt non verbal at baseline. She is bedbound with debilitation LE contractures and present with decubitus ulcer Consider palliative care for goals of care discussion with family  Speech evaluate for proper diet  DVT prophylaxis:.SCDs Code Status: Full Family Communication: No family at bedside  disposition Plan: Home with at least 2 midnight stays  Consults called:  Admission status: inpatient  Level of care: Telemetry  Status is: Inpatient  Remains inpatient appropriate because:Inpatient level of care appropriate due to severity of illness   Dispo: The patient is from: Home              Anticipated d/c is to: Home              Patient currently is not medically stable to d/c.   Difficult to place patient No         Orene Desanctis DO [facility]   If 7PM-7AM, please contact night-coverage [email or web address]   04/24/2020, 7:51 PM

## 2020-04-25 ENCOUNTER — Inpatient Hospital Stay (HOSPITAL_COMMUNITY): Payer: Medicare Other

## 2020-04-25 LAB — BASIC METABOLIC PANEL
Anion gap: 8 (ref 5–15)
BUN: 26 mg/dL — ABNORMAL HIGH (ref 8–23)
CO2: 24 mmol/L (ref 22–32)
Calcium: 9 mg/dL (ref 8.9–10.3)
Chloride: 119 mmol/L — ABNORMAL HIGH (ref 98–111)
Creatinine, Ser: 0.82 mg/dL (ref 0.44–1.00)
GFR, Estimated: >60 mL/min (ref >60)
Glucose, Bld: 108 mg/dL — ABNORMAL HIGH (ref 70–99)
Potassium: 3.7 mmol/L (ref 3.5–5.1)
Sodium: 151 mmol/L — ABNORMAL HIGH (ref 135–145)

## 2020-04-25 LAB — CBC
HCT: 30.4 % — ABNORMAL LOW (ref 36.0–46.0)
Hemoglobin: 9.6 g/dL — ABNORMAL LOW (ref 12.0–15.0)
MCH: 28.8 pg (ref 26.0–34.0)
MCHC: 31.6 g/dL (ref 30.0–36.0)
MCV: 91.3 fL (ref 80.0–100.0)
Platelets: 431 10*3/uL — ABNORMAL HIGH (ref 150–400)
RBC: 3.33 MIL/uL — ABNORMAL LOW (ref 3.87–5.11)
RDW: 15.6 % — ABNORMAL HIGH (ref 11.5–15.5)
WBC: 10.4 10*3/uL (ref 4.0–10.5)
nRBC: 0 % (ref 0.0–0.2)

## 2020-04-25 LAB — SARS CORONAVIRUS 2 (TAT 6-24 HRS): SARS Coronavirus 2: NEGATIVE

## 2020-04-25 MED ORDER — FENTANYL CITRATE (PF) 100 MCG/2ML IJ SOLN
50.0000 ug | Freq: Once | INTRAMUSCULAR | Status: AC
Start: 2020-04-25 — End: 2020-04-25
  Administered 2020-04-25: 50 ug via INTRAVENOUS
  Filled 2020-04-25: qty 2

## 2020-04-25 MED ORDER — JUVEN PO PACK
1.0000 | PACK | Freq: Two times a day (BID) | ORAL | Status: DC
  Administered 2020-04-25 – 2020-04-27 (×4): 1 via ORAL
  Filled 2020-04-25 (×6): qty 1

## 2020-04-25 MED ORDER — HYDROCODONE-ACETAMINOPHEN 5-325 MG PO TABS: 1.0000 | ORAL_TABLET | Freq: Three times a day (TID) | ORAL | Status: DC | PRN

## 2020-04-25 MED ORDER — COLLAGENASE 250 UNIT/GM EX OINT
TOPICAL_OINTMENT | Freq: Every day | CUTANEOUS | Status: DC
  Filled 2020-04-25: qty 30

## 2020-04-25 MED ORDER — ENSURE ENLIVE PO LIQD
237.0000 mL | Freq: Three times a day (TID) | ORAL | Status: DC
  Administered 2020-04-25 – 2020-04-26 (×2): 237 mL via ORAL

## 2020-04-25 MED ORDER — HYDROCODONE-ACETAMINOPHEN 5-325 MG PO TABS: 1.0000 | ORAL_TABLET | Freq: Four times a day (QID) | ORAL | Status: DC | PRN

## 2020-04-25 MED ORDER — VANCOMYCIN HCL 750 MG/150ML IV SOLN
750.0000 mg | INTRAVENOUS | Status: DC
  Filled 2020-04-25: qty 150

## 2020-04-25 MED ORDER — SODIUM CHLORIDE 0.9 % IV SOLN: INTRAVENOUS | Status: DC | PRN

## 2020-04-25 NOTE — ED Notes (Signed)
Patient is non

## 2020-04-25 NOTE — ED Notes (Signed)
Request for pain medication sent to hospitalist.

## 2020-04-25 NOTE — Evaluation (Signed)
Clinical/Bedside Swallow Evaluation Patient Details  Name: Tammy [name] MRN: [id number] Date of Birth: 06-19-1945  Today's Date: 04/25/2020 Time: SLP Start Time (ACUTE ONLY): 1410 SLP Stop Time (ACUTE ONLY): 1443 SLP Time Calculation (min) (ACUTE ONLY): 33 min  Past Medical History:  Past Medical History:  Diagnosis Date  . Alzheimer's disease ([place]) 12/28/2014  . CKD (chronic kidney disease)   . Dementia ([place])   . Headache disorder 12/28/2014  . Memory disturbance 12/28/2014  . Pulmonary embolus ([place]) 02/23/2016  . 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 No pertinent surgical history. HPI:  Pt is a 75 yo female contracted with h/o dementia admitted [facility] with wound on her right hip - concern for osteomyelitis.  Swallow evaluation ordered.  Per RN notes, "Patient was given medication with applesauce and water. Patient tolerated well, and was able to swallow with out difficulty. No coughing or water dribbling after swallowing."  Per notes, MRI of hip was non diagnostic but pt had large ball of stool in her rectum.  Per prior imaging notes, pt had unintentional weight loss in 2019.   Assessment / Plan / Recommendation Clinical Impression  Pt presents wtih surprisingly functional oropharyngeal swallow based on clinical swallow evaluation. She does not participate in CN exam but subjective observations did not reveal focal CN deficits.  Pt readily accepted po intake of soda and graham crackers with adequate mastication and oral transitiing evidenced by no retention.  Acceptance of smaller amounts of applesauce noted.  SLP had pt help hold her graham cracker and she was able to reach mouth with moderate tactile cues.  Spouse present and reports pt with a great appetitie.  Informed him to gustatory and swallow ability chnages with progressive dementia and compensations including maximizing liquid nutrition and having pt self feed as best able.  No SLP follow  up indicated as pt currently with a functional swallow. SLP Visit Diagnosis: Dysphagia, oral phase (R13.11)    Aspiration Risk       Diet Recommendation Regular;Thin liquid   Liquid Administration via: Cup;Straw Medication Administration: Whole meds with puree Supervision: Staff to assist with self feeding Compensations: Small sips/bites;Slow rate Postural Changes: Seated upright at 90 degrees;Remain upright for at least 30 minutes after po intake    Other  Recommendations Oral Care Recommendations: Oral care BID   Follow up Recommendations        Frequency and Duration     n/a       Prognosis    Good for swallowing ability    Swallow Study   General HPI: Pt is a 75 yo female contracted with h/o dementia admitted to [facility] with wound on her right hip - concern for osteomyelitis.  Swallow evaluation ordered.  Per RN notes, "Patient was given medication with applesauce and water. Patient tolerated well, and was able to swallow with out difficulty. No coughing or water dribbling after swallowing."  Per notes, MRI of hip was non diagnostic but pt had large ball of stool in her rectum.  Per prior imaging notes, pt had unintentional weight loss in 2019. Type of Study: Bedside Swallow Evaluation Previous Swallow Assessment: none Diet Prior to this Study: Regular;Thin liquids Temperature Spikes Noted: No Respiratory Status: Room air History of Recent Intubation: No Behavior/Cognition: Alert;Doesn't follow directions;Other (Comment) (pt has dementia - does not follow directions but on occasion she speaks to her spouse - voice is clear) Oral Cavity Assessment: Other (comment) (pt has a birthmark  on the front of her tongue - also appears with potentially geographical tongue, she did not open adequately to assess palatal elevation) Oral Care Completed by SLP: No Oral Cavity - Dentition: Adequate natural dentition Vision: Impaired for self-feeding Self-Feeding Abilities: Total assist;Other  (Comment) (pt able to hold cup, finger foods at home per family but does not put the cup back down in it's place after swallowing per spouse) Patient Positioning: Upright in bed Baseline Vocal Quality: Normal Volitional Cough: Cognitively unable to elicit Volitional Swallow: Unable to elicit    Oral/Motor/Sensory Function Overall Oral Motor/Sensory Function: Other (comment) (from cursory observation, pt without focal CN deficits)   Ice Chips Ice chips: Not tested   Thin Liquid Thin Liquid: Within functional limits Presentation: Straw    Nectar Thick Nectar Thick Liquid: Not tested   Honey Thick Honey Thick Liquid: Not tested   Puree Puree: Within functional limits Presentation: Spoon Other Comments: pt took very small boluses but no indication of aspiration with po   Solid     Solid: Impaired Presentation: Self Fed Other Comments: pt held her own graham cracker and brought to oral cavity, overall functional mastication and oral clearance evidenced by clear oral cavity post=swallow      Macario Golds 04/25/2020,3:16 PM   Kathleen Lime, MS [facility] SLP [place] Office [phone] Pager [phone]

## 2020-04-25 NOTE — Progress Notes (Signed)
Initial Nutrition Assessment  INTERVENTION:   -Ensure Enlive po TID, each supplement provides 350 kcal and 20 grams of protein  -Juven Fruit Punch BID, each serving provides 95kcal and 2.5g of protein (amino acids glutamine and arginine)  NUTRITION DIAGNOSIS:   Increased nutrient needs related to wound healing as evidenced by estimated needs.  GOAL:   Patient will meet greater than or equal to 90% of their needs  MONITOR:   PO intake,Supplement acceptance,Labs,Weight trends,I & O's,Skin  REASON FOR ASSESSMENT:   Malnutrition Screening Tool    ASSESSMENT:   75 y.o. female with medical history significant for Alzheimer's nonverbal at baseline, PE/DVT on Xarelto, hypertension, seizure on Keppra and CKD stage II who presents for concerns of right hip ulceration.  Patient nonverbal with history of dementia. Pt wheelchair bound with contractures. Per chart review, pt has multiple wounds with new bilateral hip pressure injuries.  No PO documented. Will order supplements to aid in wound healing.   Per SLP evaluation, pt recommended regular diet.  Per weight records, pt has had no recent weight changes.  Medications: Multivitamin with minerals daily  Labs reviewed:  Elevated Na  NUTRITION - FOCUSED PHYSICAL EXAM:  Unable to complete  Diet Order:   Diet Order            Diet regular Room service appropriate? Yes; Fluid consistency: Thin  Diet effective now                 EDUCATION NEEDS:   No education needs have been identified at this time  Skin:  Skin Assessment: Skin Integrity Issues: Skin Integrity Issues:: Unstageable,Stage III,DTI DTI: right foot Stage III: right malleolus Unstageable: bilateral hip  Last BM:  PTA  Height:   Ht Readings from Last 1 Encounters:  04/24/20 5\' 2"  (1.575 m)    Weight:   Wt Readings from Last 1 Encounters:  04/24/20 53.5 kg   BMI:  Body mass index is 21.58 kg/m.  Estimated Nutritional Needs:   Kcal:   1650-1850  Protein:  80-95g  Fluid:  1.9L/day  Clayton Bibles, MS, RD, LDN Inpatient Clinical Dietitian Contact information available via Amion

## 2020-04-25 NOTE — Plan of Care (Signed)

## 2020-04-25 NOTE — Progress Notes (Addendum)
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 With her husband and her daughter.  She has a second daughter who lives nearby and comes every day to help take care of her.  She is not mobile, unable to do anything for herself, has no meaningful communication and needs to be positioned.  She either lays in bed or sits up in a chair. She has not seen a primary care physician in almost a year.  She was previously seeing Dr. Lurline [name] at [name] however this practice close down and the patient's husband has not found a new physician for her.  She is given her Keppra and her Xarelto regularly but takes no other medications.  In the ED the patient had a heart rate in the low 100s, BP was around 111/86. Was 33, creatinine 1.10, sodium 151, lactic acid 2.1, CRP 11.9 and WBC count 12.0. She was noted to have ulcers on bilateral hip/trochanteric areas with the right one being worse than the left.  X-rays performed of bilateral hips  Left hip x-ray >slightly indistinct appearance of the cortex at the trochanter questionable for early osteomyelitis change. Right hip x-ray> normal hip, large amount of stool in rectum  Subjective: She does not answer questions, she does mumble some words but these are incomprehensible.  Her husband is at bedside and gives the history.    Assessment & Plan:   Principal Problem:   Acute-on-chronic kidney injury -Elevated BUN/creatinine and lactic acid all pointing towards dehydration -her husband insists she has been eating and drinking well at home but it seems other wise from labs  Active Problems:   Decubitus ulcers - b/l on hip- see  pictures below - She was not cooperative for the MRI  - I have spoken with her husband and we feel that, due to the severity of her Alzheimer's disease, immobility and contractures, her quality of life will not be much affected if we treat her with antibiotics  Poor prognosis -  He agrees with stopping antibiotics and pursuing comfort care- He would like her to be a DNR.    Alzheimer's disease  - severe, as mentioned above    Essential hypertension - mild, not on meds at home    History of deep vein thrombosis (DVT) of lower extremity   History of pulmonary embolism - Xarelto on hold    Seizure disorder - would continue Keppra   Time spent in minutes: 45 DVT prophylaxis: none Code Status: DNR- changed to DNR after discussion with her husband today Family Communication: husband, [name] Level of Care: Level of care: Telemetry Disposition Plan:  Status is: Inpatient  Remains inpatient appropriate because:Unsafe d/c plan and Inpatient level of care appropriate due to severity of illness Needs disimpaction of 9 cm stool ball & palliative consult  Dispo: The patient is from: Home              Anticipated d/c is to: Home              Patient currently is not medically stable to d/c.   Difficult to place patient No Consultants:  palliative Procedures:   none Antimicrobials:  Anti-infectives (From admission, onward)   Start     Dose/Rate Route Frequency Ordered Stop   04/26/20 0600  vancomycin (VANCOCIN) IVPB 1000 mg/200 mL premix  Status:  Discontinued        1,000 mg 200 mL/hr over 60 Minutes Intravenous Every 36 hours 04/24/20 2028 04/25/20 1016   04/25/20 1800  vancomycin (VANCOREADY) IVPB 750 mg/150 mL        750 mg 150 mL/hr over 60 Minutes Intravenous Every 24 hours 04/25/20 1016     04/24/20 1715  vancomycin (VANCOCIN) IVPB 1000 mg/200 mL premix        1,000 mg 200 mL/hr over 60 Minutes Intravenous  Once 04/24/20 1713 04/24/20 1928        Objective: Vitals:   04/25/20 0400 04/25/20 0500 04/25/20 0645 04/25/20 1356  BP: (!) 146/94 124/84 140/78 134/79  Pulse: 74 66 64 81  Resp: 17 13 14 16   Temp:   97.8 F (36.6 C) (!) 97.4 F (36.3 C)  TempSrc:   Axillary Oral  SpO2: 100% 99% 100% 100%  Weight:      Height:        Intake/Output Summary (Last 24 hours) at 04/25/2020 1454 Last data filed at 04/25/2020 0600 Gross per 24 hour  Intake 1805 ml  Output --  Net 1805 ml   Filed Weights   04/24/20 1404  Weight: 53.5 kg    Examination: General exam: Appears comfortable  HEENT:   no sclera icterus or thrush Respiratory system: Clear to auscultation. Respiratory effort normal. Cardiovascular system: S1 & S2 heard, RRR.   Gastrointestinal system: Abdomen soft, non-tender, nondistended. Normal bowel sounds. Central nervous system: asleep- not following commands, extremities contracted. Extremities: No cyanosis, clubbing or edema Skin:  s see pic of hips- also has a small decubitus on her  left metatarsal head  Right hip l     Psychiatry:  Cannot assess    Data Reviewed: I have personally reviewed following labs and imaging studies  CBC: Recent Labs  Lab 04/24/20 1518 04/25/20 0444  WBC 12.0* 10.4  NEUTROABS 9.7*  --   HGB 10.8* 9.6*  HCT 35.6* 30.4*  MCV 96.7 91.3  PLT 435* 599*   Basic Metabolic Panel: Recent Labs  Lab 04/24/20 1518 04/25/20 0444  NA 151* 151*  K 4.2 3.7  CL 119* 119*  CO2 23 24  GLUCOSE 107* 108*  BUN 33* 26*  CREATININE 1.10* 0.82  CALCIUM 9.2 9.0   GFR: Estimated Creatinine Clearance: 48.3 mL/min (by C-G formula based on SCr of 0.82 mg/dL). Liver Function Tests: Recent Labs  Lab 04/24/20 1518  AST 27  ALT 21  ALKPHOS 73  BILITOT 0.4  PROT 7.6  ALBUMIN 2.9*   No results for input(s): LIPASE, AMYLASE in the last 168 hours. No results for input(s): AMMONIA in the last 168 hours. Coagulation Profile: No results for input(s): INR, PROTIME in the last 168  hours. Cardiac Enzymes: No results for input(s): CKTOTAL, CKMB, CKMBINDEX, TROPONINI in the last 168 hours. BNP (last 3 results) No results for input(s): PROBNP in the last 8760 hours. HbA1C: No results for input(s): HGBA1C in the last 72 hours. CBG: No results for input(s): GLUCAP in the last 168 hours. Lipid Profile: No results for input(s): CHOL, HDL, LDLCALC, TRIG, CHOLHDL, LDLDIRECT in the last 72 hours. Thyroid Function Tests: No results for input(s): TSH, T4TOTAL, FREET4, T3FREE, THYROIDAB in the last 72 hours. Anemia Panel: No results for  input(s): VITAMINB12, FOLATE, FERRITIN, TIBC, IRON, RETICCTPCT in the last 72 hours. Urine analysis:    Component Value Date/Time   COLORURINE YELLOW 02/23/2019 1035   APPEARANCEUR HAZY (A) 02/23/2019 1035   LABSPEC 1.023 02/23/2019 1035   PHURINE 5.0 02/23/2019 1035   GLUCOSEU NEGATIVE 02/23/2019 1035   HGBUR NEGATIVE 02/23/2019 1035   [place] 02/23/2019 1035   BILIRUBINUR negative 04/30/2017 1211   KETONESUR NEGATIVE 02/23/2019 1035   PROTEINUR NEGATIVE 02/23/2019 1035   UROBILINOGEN 0.2 04/30/2017 1211   NITRITE NEGATIVE 02/23/2019 1035   LEUKOCYTESUR NEGATIVE 02/23/2019 1035   Sepsis Labs: @LABRCNTIP (procalcitonin:4,lacticidven:4) ) Recent Results (from the past 240 hour(s))  Culture, blood (routine x 2)     Status: None (Preliminary result)   Collection Time: 04/24/20  4:42 PM   Specimen: BLOOD LEFT FOREARM  Result Value Ref Range Status   Specimen Description   Final    BLOOD LEFT FOREARM Performed at [facility], [place] [address], [place], [place] [address]    Special Requests   Final    BOTTLES DRAWN AEROBIC AND ANAEROBIC Blood Culture adequate volume Performed at [facility] [address], [place], [place] [address]    Culture   Final    NO GROWTH < 12 HOURS Performed at [facility] [address], [place], [place] [address]    Report Status PENDING   Incomplete  Culture, blood (routine x 2)     Status: None (Preliminary result)   Collection Time: 04/24/20  4:42 PM   Specimen: BLOOD  Result Value Ref Range Status   Specimen Description   Final    BLOOD RIGHT ANTECUBITAL Performed at [name] [address] [place], [place] [address]    Special Requests   Final    BOTTLES DRAWN AEROBIC AND ANAEROBIC Blood Culture results may not be optimal due to an excessive volume of blood received in culture bottles Performed at [facility] [address], [place], [place] [address]    Culture   Final    NO GROWTH < 12 HOURS Performed at [facility] [address] [place], [place] [address]    Report Status PENDING  Incomplete  SARS CORONAVIRUS 2 (TAT 6-24 HRS) Nasopharyngeal Nasopharyngeal Swab     Status: None   Collection Time: 04/24/20  7:53 PM   Specimen: Nasopharyngeal Swab  Result Value Ref Range Status   SARS Coronavirus 2 NEGATIVE NEGATIVE Final    Comment: (NOTE) SARS-CoV-2 target nucleic acids are NOT DETECTED.  The SARS-CoV-2 RNA is generally detectable in upper and lower respiratory specimens during the acute phase of infection. Negative results do not preclude SARS-CoV-2 infection, do not rule out co-infections with other pathogens, and should not be used as the sole basis for treatment or other patient management decisions. Negative results must be combined with clinical observations, patient history, and epidemiological information. The expected result is Negative.  Fact Sheet for Patients: SugarRoll.be  Fact Sheet for Healthcare Providers: https://www.woods-mathews.com/  This test is not yet approved or cleared by the [place] FDA and  has been authorized for detection and/or diagnosis of SARS-CoV-2 by FDA under an Emergency Use Authorization (EUA). This EUA will remain  in effect (meaning this test can be used) for the  duration of the COVID-19 declaration under Se ction 564(b)(1) of the Act, 21 U.S.C. section 360bbb-3(b)(1), unless the authorization is terminated or revoked sooner.  Performed at [facility], [address] [place], [place] [address]          Radiology Studies: MR PELVIS WO CONTRAST  Result Date: 04/25/2020 CLINICAL DATA:  Bilateral hip ulcers EXAM: MRI PELVIS WITHOUT CONTRAST TECHNIQUE: Multiplanar multisequence MR imaging of the pelvis was performed. No intravenous contrast was administered. COMPARISON:  X-ray 04/24/2020 FINDINGS: Nondiagnostic exam. Patient terminated the examination during initial sequence acquisition. A single motion degraded coronal series through the pelvis demonstrates no obvious acute findings. There is a very large rectal stool ball measuring 9 cm in diameter. IMPRESSION: 1. Nondiagnostic exam. Only a single motion degraded coronal series through the pelvis was able to be obtained which demonstrates no obvious acute findings. Consider CT further evaluation or possibly repeat MRI when patient is able to better tolerate exam. 2. Very large rectal stool ball measuring 9 cm in diameter. Electronically Signed   By: Davina Poke D.O.   On: 04/25/2020 12:01   DG Hip [place] W or [place] Pelvis 1 View Right  Result Date: 04/24/2020 CLINICAL DATA:  Right hip pain and cutaneous sore. EXAM: DG HIP (WITH OR WITHOUT PELVIS) 1V PORT RIGHT COMPARISON:  Abdomen and pelvis CT dated 11/09/2016. FINDINGS: Normal appearing right hip. No visible soft tissue abnormality. Large amount of stool in the rectum. Moderate stool in the remainder of the included portions of the colon. Mild lower lumbar spine degenerative changes. IMPRESSION: 1. Normal appearing right hip. 2. Large amount of stool in the rectum. Electronically Signed   By: Claudie Revering M.D.   On: 04/24/2020 15:04   DG Hip Unilat W or Wo Pelvis 2-3 Views Left  Result Date: 04/24/2020 CLINICAL DATA:  Skin ulcer over  greater trochanter EXAM: DG HIP (WITH OR WITHOUT PELVIS) 2-3V LEFT COMPARISON:  09/02/2007 FINDINGS: No fracture or malalignment. Slightly indistinct appearance of the cortex at the trochanter. No frank bony destructive change. No soft tissue emphysema. IMPRESSION: Slightly indistinct appearance of the cortex at the trochanter questionable for early osteomyelitis change. Electronically Signed   By: Donavan Foil M.D.   On: 04/24/2020 17:30      Scheduled Meds: . collagenase   Topical Daily  . levETIRAcetam  500 mg Oral BID  . multivitamin with minerals  1 tablet Oral Daily   Continuous Infusions: . sodium chloride    . vancomycin       LOS: 1 day      Debbe Odea, MD [facility] Pager: [email or web address] 04/25/2020, 2:54 PM

## 2020-04-25 NOTE — Consult Note (Signed)
[facility] Nurse Consult Note: Reason for Consult: bilateral Unstageable hip pressure injuries. Also with pressure injuries to right lateral malleolus and foot.  Sacrum, heels Patient with contractures, is non-verbal. Her husband is in the room at the time of my assessment and offers supplemental information. Wound type:Pressure Pressure Injury POA: Yes Measurement: Left hip:  Unstageable pressure injury, full thickness.  8cm x 6cm with firmly adherent black eschar.  No fluctuance, no surrounding warmth, no drainage Right hip: 6cm x 6cm Unstageable pressure injury with nonviable soft, movable, yellow, necrotic slough obscuring wound bed.Drainage is serous to light yellow in a moderate amount and consistent with autolytically debriding necrotic tissue. Right lateral malleolus:  Stage 3 pressure injury measuring 1.5cm x 2cm x 0.2cm with hyperpigmented periwound skin and no drainage Right lateral foot: DTPI measuring 2cm round.  No drainage, no warmth. Wound bed:As described abvoe Drainage (amount, consistency, odor) As described above Periwound: Dry, intact. Dressing procedure/placement/frequency: I will provide patient with a mattress replacement with low air loss feature, also bilateral pressure redistribution heel boots. A sacral foam dressing will be applied for pressure injury prevention and silicone foam dressing will be applied to the right lateral malleolus and right lateral foot.   Wound care to the bilateral hip Unstageable wounds will be with collagenase to the left hip and hydrotherapy to the right hip, followed by collagenase.  PT can perform hydrotherapy 6 times per week while in house, but this should not delay discharge or placement.  When medically able to discharge, please discontinue hydrotherapy and continue with mattress replacement and daily wound care with collagenase (Santyl).  Upon discharge, HHRN is recommended if patient is not to be placed in a SNF.  [facility] nursing team will not  follow, but will remain available to this patient, the nursing and medical teams.  Please re-consult if needed. Thanks, Maudie Flakes, MSN, RN, [place], [facility]  Pager# [phone]

## 2020-04-25 NOTE — ED Notes (Signed)
Patient was incontinent of urine and linens were changed. Patient bandage on the right hip was also changed due to being soiled. Patient was turned to the left side. Pillows and blankets used for comfort.

## 2020-04-25 NOTE — ED Notes (Signed)
Patient is non verbal, but appears to less restless and settled in bed. BP is also decreased.

## 2020-04-25 NOTE — Progress Notes (Signed)
Patient's daughter on contact sheet was called but no answer to help obtain admission questions. Patient nonverbal and unable to answer history, etc.

## 2020-04-25 NOTE — ED Notes (Signed)
MRI not completed as patient is contracted and unable to lay flat.

## 2020-04-25 NOTE — ED Notes (Signed)
Patient diaper was changed. Patient was turned to the right side, pillows and rolled blankets used for comfort. Patient was given medication with applesauce and water. Patient tolerated well, and was able to swallow with out difficulty. No coughing or water dribbling after swallowing. Dressing on the left hip was also changed due to it being soiled.

## 2020-04-25 NOTE — Progress Notes (Signed)
Pharmacy Antibiotic Note  [name] is a 75 y.o. female with hx Alzheimer's presented to the ED on 04/24/2020 with right hip ulceration. Hip X-ray on 4/12 showed findings with concern for early osteomyelitis. She's currently on vancomycin for infection.    Today, 04/25/2020: - day #1 abx - afeb, wbc wnl - scr 0.82  Plan: - adjust vancomycin dose to 750 mg IV q24h for est AUC 432  _____________________________________________  Height: 5\' 2"  (157.5 cm) Weight: 53.5 kg (118 lb) IBW/kg (Calculated) : 50.1  Temp (24hrs), Avg:97.5 F (36.4 C), Min:96.6 F (35.9 C), Max:98 F (36.7 C)  Recent Labs  Lab 04/24/20 1518 04/24/20 1642 04/24/20 1752 04/25/20 0444  WBC 12.0*  --   --  10.4  CREATININE 1.10*  --   --  0.82  LATICACIDVEN  --  2.1* 1.3  --     Estimated Creatinine Clearance: 48.3 mL/min (by C-G formula based on SCr of 0.82 mg/dL).    Allergies  Allergen Reactions  . Sulfa Antibiotics Palpitations     Thank you for allowing pharmacy to be a part of this patient's care.  Lynelle Doctor 04/25/2020 10:09 AM

## 2020-04-26 DIAGNOSIS — R531 Weakness: Secondary | ICD-10-CM

## 2020-04-26 DIAGNOSIS — Z515 Encounter for palliative care: Secondary | ICD-10-CM

## 2020-04-26 DIAGNOSIS — Z7189 Other specified counseling: Secondary | ICD-10-CM

## 2020-04-26 NOTE — Progress Notes (Signed)
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  In the ED she was found to have heart rate in low 100s, blood pressure 111 x 86, lactic acid 2.1, CRP 11.9.  Also found to have ulcers in bilateral hip/trochanteric areas with right 1 being worse than left.  X-ray of the hips showed slight indistinct appearance of the cortex of trochanter, questionable early osteomyelitis change.  Right hip x-ray showed large amount of stool in the rectum.  MRI of the hips was recommended however patient did not get MRI as she was not cooperative.  Dr. Wynelle Cleveland discussed with patient's husband regarding poor prognosis, palliative care was consulted.  Patient has been made full comfort care only.  Plan to go home with hospice.    Subjective   Patient seen and examined, denies any complaints.     Assessment/Plan:     1. Decubitus ulcer-MRI of the pelvis was ordered however could not be performed as patient was not cooperative.  Hydrotherapy was ordered.  Dr. Wynelle Cleveland discussed with patient's husband regarding poor prognosis given severity of Alzheimer's disease, immobility and contractures.  Also poor quality of life was discussed.  Palliative care was consulted.  Patient has been made full comfort care.  Plan to go home with hospice. 2. Seizure disorder-continue Keppra 3. Alzheimer's disease-patient has severe dementia as mentioned above.  Poor quality of life and poor prognosis. 4. History of DVT/pulmonary embolism-Xarelto on hold. 5. Plan to go home with hospice.   Scheduled medications:   . collagenase   Topical Daily  . feeding supplement  237 mL Oral TID BM  . levETIRAcetam  500 mg Oral BID  . multivitamin with minerals  1  tablet Oral Daily  . nutrition supplement (JUVEN)  1 packet Oral BID WC         Data Reviewed:   CBG:  No results for input(s): GLUCAP in the last 168 hours.  SpO2: 100 %    Vitals:   04/25/20 0645 04/25/20 1356 04/25/20 2042 04/26/20 0547  BP: 140/78 134/79 115/70 126/85  Pulse: 64 81 71 72  Resp: 14 16 14 15   Temp: 97.8 F (36.6 C) (!) 97.4 F (36.3 C)    TempSrc: Axillary Oral    SpO2: 100% 100% 100% 100%  Weight:    50.3 kg  Height:         Intake/Output Summary (Last 24 hours) at 04/26/2020 1846 Last data filed at 04/26/2020 0500 Gross per 24 hour  Intake --  Output 350 ml  Net -350 ml    04/12 1901 - 04/14 0700 In: 2045 [P.O.:240] Out: 350 [Urine:350]  Filed Weights   04/24/20 1404 04/26/20 0547  Weight: 53.5 kg 50.3 kg    CBC:  Recent Labs  Lab 04/24/20 1518 04/25/20 0444  WBC 12.0* 10.4  HGB 10.8* 9.6*  HCT 35.6* 30.4*  PLT 435* 431*  MCV 96.7 91.3  MCH 29.3 28.8  MCHC 30.3 31.6  RDW 15.8* 15.6*  LYMPHSABS 1.4  --   MONOABS 0.6  --   EOSABS 0.1  --   BASOSABS 0.1  --     Complete metabolic panel:  Recent Labs  Lab 04/24/20 1518 04/24/20 1642 04/24/20 1752 04/25/20 0444  NA 151*  --   --  151*  K 4.2  --   --  3.7  CL 119*  --   --  119*  CO2 23  --   --  24  GLUCOSE 107*  --   --  108*  BUN 33*  --   --  26*  CREATININE 1.10*  --   --  0.82  CALCIUM 9.2  --   --  9.0  AST 27  --   --   --   ALT 21  --   --   --   ALKPHOS 73  --   --   --   BILITOT 0.4  --   --   --   ALBUMIN 2.9*  --   --   --   CRP  --  11.9*  --   --   LATICACIDVEN  --  2.1* 1.3  --     No results for input(s): LIPASE, AMYLASE in the last 168 hours.  Recent Labs  Lab 04/24/20 1642 04/24/20 1953  CRP 11.9*  --   SARSCOV2NAA  --  NEGATIVE    ------------------------------------------------------------------------------------------------------------------ No results for input(s): CHOL, HDL, LDLCALC, TRIG, CHOLHDL, LDLDIRECT in the last 72  hours.  Lab Results  Component Value Date   HGBA1C 6.0 (H) 08/12/2017   ------------------------------------------------------------------------------------------------------------------ No results for input(s): TSH, T4TOTAL, T3FREE, THYROIDAB in the last 72 hours.  Invalid input(s): FREET3 ------------------------------------------------------------------------------------------------------------------ No results for input(s): VITAMINB12, FOLATE, FERRITIN, TIBC, IRON, RETICCTPCT in the last 72 hours.  Coagulation profile  No results for input(s): INR, PROTIME in the last 168 hours.  No results for input(s): DDIMER in the last 72 hours.  Cardiac Enzymes  No results for input(s): CKMB, TROPONINI, MYOGLOBIN in the last 168 hours.  Invalid input(s): CK ------------------------------------------------------------------------------------------------------------------ No results found for: BNP   Antibiotics: Anti-infectives (From admission, onward)   Start     Dose/Rate Route Frequency Ordered Stop   04/26/20 0600  vancomycin (VANCOCIN) IVPB 1000 mg/200 mL premix  Status:  Discontinued        1,000 mg 200 mL/hr over 60 Minutes Intravenous Every 36 hours 04/24/20 2028 04/25/20 1016   04/25/20 1800  vancomycin (VANCOREADY) IVPB 750 mg/150 mL  Status:  Discontinued        750 mg 150 mL/hr over 60 Minutes Intravenous Every 24 hours 04/25/20 1016 04/25/20 1502   04/24/20 1715  vancomycin (VANCOCIN) IVPB 1000 mg/200 mL premix        1,000 mg 200 mL/hr over 60 Minutes Intravenous  Once 04/24/20 1713 04/24/20 1928       Radiology Reports  MR PELVIS WO CONTRAST  Result Date: 04/25/2020 CLINICAL DATA:  Bilateral hip ulcers EXAM: MRI PELVIS WITHOUT CONTRAST TECHNIQUE: Multiplanar multisequence MR imaging of the pelvis was performed. No intravenous contrast was administered. COMPARISON:  X-ray 04/24/2020 FINDINGS: Nondiagnostic exam. Patient terminated the examination during initial  sequence acquisition. A single motion degraded coronal series through the pelvis demonstrates no obvious acute findings. There is a very large rectal stool ball measuring 9 cm in diameter. IMPRESSION: 1. Nondiagnostic exam. Only a single motion degraded coronal series through the pelvis was able to be obtained which demonstrates no obvious acute findings. Consider CT further evaluation or possibly repeat MRI when patient is able to better tolerate exam. 2. Very large rectal stool ball measuring 9 cm in diameter. Electronically Signed   By: Davina Poke D.O.   On: 04/25/2020 12:01  DVT prophylaxis: Xarelto on hold  Code Status: Full code  Family Communication: No family at bedside   Consultants:  Palliative care  Procedures:      Objective    Physical Examination:    General-appears in no acute distress  Heart-S1-S2, regular, no murmur auscultated  Lungs-clear to auscultation bilaterally, no wheezing or crackles auscultated  Abdomen-soft, nontender, no organomegaly  Extremities-no edema in the lower extremities  Neuro-alert, nonverbal at baseline, not following commands   Status is: Inpatient  Dispo: The patient is from: Home              Anticipated d/c is to: Home              Anticipated d/c date is: 04/27/2020              Patient currently awaiting arrangement for home hospice  Barrier to discharge-awaiting arrangement for home hospice  COVID-19 Labs  Recent Labs    04/24/20 1642  CRP 11.9*    Lab Results  Component Value Date   [place] NEGATIVE 04/24/2020   [place] NEGATIVE 02/23/2019    Microbiology  Recent Results (from the past 240 hour(s))  Culture, blood (routine x 2)     Status: None (Preliminary result)   Collection Time: 04/24/20  4:42 PM   Specimen: BLOOD LEFT FOREARM  Result Value Ref Range Status   Specimen Description   Final    BLOOD LEFT FOREARM Performed at [facility], [place] [address], [place], [place] [address]    Special Requests   Final    BOTTLES DRAWN AEROBIC AND ANAEROBIC Blood Culture adequate volume Performed at [facility] [address], [place], [place] [address]    Culture   Final    NO GROWTH 2 DAYS Performed at [facility] [address], [place], [place] [address]    Report Status PENDING  Incomplete  Culture, blood (routine x 2)     Status: None (Preliminary result)   Collection Time: 04/24/20  4:42 PM   Specimen: BLOOD  Result Value Ref Range Status   Specimen Description   Final    BLOOD RIGHT ANTECUBITAL Performed at [facility] [address], [place], [place] [address]    Special Requests   Final    BOTTLES DRAWN AEROBIC AND ANAEROBIC Blood Culture results may not be optimal due to an excessive volume of blood received in culture bottles Performed at [facility] [address], [place], [place] [address]    Culture   Final    NO GROWTH 2 DAYS Performed at [facility] [address], [place], [place] [address]    Report Status PENDING  Incomplete  SARS CORONAVIRUS 2 (TAT 6-24 HRS) Nasopharyngeal Nasopharyngeal Swab     Status: None   Collection Time: 04/24/20  7:53 PM   Specimen: Nasopharyngeal Swab  Result Value Ref Range Status   SARS Coronavirus 2 NEGATIVE NEGATIVE Final    Comment: (NOTE) SARS-CoV-2 target nucleic acids are NOT DETECTED.  The SARS-CoV-2 RNA is generally detectable in upper and lower respiratory specimens during the acute phase of infection. Negative results do not preclude SARS-CoV-2 infection, do not rule out co-infections with other pathogens, and should not be used as the sole basis for treatment or other patient management decisions. Negative results must be combined with clinical observations, patient history, and epidemiological information. The expected result is Negative.  Fact Sheet for  Patients: SugarRoll.be  Fact Sheet for Healthcare Providers: https://www.woods-mathews.com/  This test is not yet approved or cleared by the [place] FDA and  has been authorized for detection and/or diagnosis of SARS-CoV-2 by FDA under an Emergency Use Authorization (EUA). This EUA will remain  in effect (meaning this test can be used) for the duration of the COVID-19 declaration under Se ction 564(b)(1) of the Act, 21 U.S.C. section 360bbb-3(b)(1), unless the authorization is terminated or revoked sooner.  Performed at [facility], [place] [address] [place], [place] [address]     Pressure Injury 04/25/20 Ankle Anterior;Right Dry (Active)  04/25/20 0645  Location: Ankle  Location Orientation: Anterior;Right  Staging:   Wound Description (Comments): Dry  Present on Admission: Yes     Pressure Injury 04/25/20 Foot Anterior;Right Dry (Active)  04/25/20 0645  Location: Foot  Location Orientation: Anterior;Right  Staging:   Wound Description (Comments): Dry  Present on Admission: Yes     Pressure Injury Ankle Anterior;Left Dry (Active)     Location: Ankle  Location Orientation: Anterior;Left  Staging:   Wound Description (Comments): Dry  Present on Admission: Yes     Pressure Injury 04/25/20 Thigh Anterior;Proximal;Right (Active)  04/25/20 0645  Location: Thigh  Location Orientation: Anterior;Proximal;Right  Staging:   Wound Description (Comments):   Present on Admission: Yes     Pressure Injury 04/25/20 Thigh Anterior;Left;Proximal (Active)  04/25/20 0645  Location: Thigh  Location Orientation: Anterior;Left;Proximal  Staging:   Wound Description (Comments):   Present on Admission: Yes          [place]   [facility] If 7PM-7AM, please contact night-coverage at [email or web address], Office  [phone]   04/26/2020, 6:46 PM  LOS: 2 days

## 2020-04-26 NOTE — Progress Notes (Addendum)
[facility])   Addendum: Pt has been deemed eligible for hospice by [facility] MD.  Received request from [facility] for hospice services at home after discharge.  Chart and pt information under review by [facility] physician.  Hospice eligibility pending at this time.  Hospital liaison spoke with pt's spouse and daughter, Nat Math, to initiate education related to hospice philosophy and services and to answer any questions at this time.  Both verbalized understanding of information given.  Per discussion pt will discharge home tomorrow via [facility] once DME is in place.  [place] send signed and completed DNR home with pt/family.  Please provide prescriptions at discharge as needed to ensure ongoing symptom management until pt can be admitted onto hospice services.    DME needs discussed.  Hospital bed, OBT, alternating air pressure mattress topper, and Hoyer lift ordered.  Address has been verified and is correct in the chart.  ACC information and contact numbers given to Mr. Labreck and daughter Nat Math. e information shared with Jorene Guest Manager.  Please call with any questions or concerns.  Thank you for the opportunity to participate in this pt's care.  Domenic Moras, BSN, RN [facility] [phone] (24h on call)

## 2020-04-26 NOTE — Consult Note (Signed)
Consultation Note Date: 04/26/2020   Patient Name: Tammy [name]  DOB: 03/21/1945  MRN: [id number]  Age / Sex: 75 y.o., female  PCP: Jacelyn Pi, Lilia Argue, MD Referring Physician: Oswald Hillock, MD  Reason for Consultation: Establishing goals of care  HPI/Patient Profile: 75 y.o. female  admitted on 04/24/2020   Clinical Assessment and Goals of Care: 75 year old lady who goes by the name Tammy [name], lives at home with her husband here in [place], [place].  Patient has life limiting illness of Alzheimer's dementia.  Her baseline is such that she is either in a wheelchair or in bed and does not verbalize.  She has history of recurrent venous thromboembolism and seizures hypertension.  She was brought into the hospital because of ulcer on her right hip.  Patient lives at home with husband, patient has a daughter who comes in on a daily basis and helps provide assistance with activities of daily living.  Patient admitted to hospital medicine service found to have questionable early osteomyelitis changes left hip, initial goals of care discussions undertaken by hospital medicine, CODE STATUS changed to DO NOT RESUSCITATE, elected to discontinue antibiotics and pursue comfort measures and a palliative consult has been requested.  Patient is awake alert resting in bed.  She does not verbalize.  She appears to be comfortable.  Husband present at bedside.  I introduced myself and palliative care as follows:  Palliative medicine is specialized medical care for people living with serious illness. It focuses on providing relief from the symptoms and stress of a serious illness. The goal is to improve quality of life for both the patient and the family.  Goals of care: Broad aims of medical therapy in relation to the patient's values and preferences. Our aim is to provide medical care aimed at enabling patients to achieve the  goals that matter most to them, given the circumstances of their particular medical situation and their constraints.   Goals wishes and values important to the patient and family as a unit attempted to be explored.  Patient's husband is a [place] veteran and he was thanked for his service to the country.  Patient's husband states that he has been married to the patient for 52 years.  He wishes for ongoing comfort measures and provision for quality of life inside the patient's own familiar surroundings at home.  He does not want to send the patient to rehab facility.  Patient does not have any aspiration or swallowing difficulties, ate 100% of her breakfast.  Husband is primary caregiver.  We discussed about hospice philosophy of care.  We discussed about patient's current stage of dementia and appropriateness of hospice services to continue with dignity and comfort directed care and avoidance of suffering.  He is in agreement.  See below.  Will consult TOC.  NEXT OF KIN    SUMMARY OF RECOMMENDATIONS   Agree with DO NOT RESUSCITATE Recommend home with hospice support Continue current mode of care Continue regular diet Thank you for the consult. Code  Status/Advance Care Planning:  DNR    Symptom Management:      Palliative Prophylaxis:   Delirium Protocol  Additional Recommendations (Limitations, Scope, Preferences):  Full Comfort Care  Psycho-social/Spiritual:   Desire for further Chaplaincy support:yes  Additional Recommendations: Education on Hospice  Prognosis:   < 6 months  Discharge Planning: Home with Hospice      Primary Diagnoses: Present on Admission: . Decubitus ulcer . Alzheimer's disease ([place]) . Essential hypertension . History of pulmonary embolism   I have reviewed the medical record, interviewed the patient and family, and examined the patient. The following aspects are pertinent.  Past Medical History:  Diagnosis Date  . Alzheimer's disease  ([place]) 12/28/2014  . CKD (chronic kidney disease)   . Dementia ([place])   . Headache disorder 12/28/2014  . Memory disturbance 12/28/2014  . Pulmonary embolus ([place]) 02/23/2016  . Right femoral vein DVT ([place]) 02/24/2016   Social History   Socioeconomic History  . Marital status: Married    Spouse name: Tammy [name]  . Number of children: 3  . Years of education: 5  . Highest education level: Not on file  Occupational History  . Occupation: retired  Tobacco Use  . Smoking status: Never Smoker  . Smokeless tobacco: Never Used  Vaping Use  . Vaping Use: Never used  Substance and Sexual Activity  . Alcohol use: No  . Drug use: No  . Sexual activity: Yes  Other Topics Concern  . Not on file  Social History Narrative   Married with 3 children   Right handed   8th grade   1 cup coffee daily   Social Determinants of Health   Financial Resource Strain: Not on file  Food Insecurity: Not on file  Transportation Needs: Not on file  Physical Activity: Not on file  Stress: Not on file  Social Connections: Not on file   Family History  Problem Relation Age of Onset  . Heart disease Brother   . Heart attack Mother   . Dementia Mother    Scheduled Meds: . collagenase   Topical Daily  . feeding supplement  237 mL Oral TID BM  . levETIRAcetam  500 mg Oral BID  . multivitamin with minerals  1 tablet Oral Daily  . nutrition supplement (JUVEN)  1 packet Oral BID WC   Continuous Infusions: . sodium chloride     PRN Meds:.sodium chloride, HYDROcodone-acetaminophen Medications Prior to Admission:  Prior to Admission medications   Medication Sig Start Date End Date Taking? Authorizing Provider  levETIRAcetam (KEPPRA) 500 MG tablet Take 1 tablet (500 mg total) by mouth 2 (two) times daily. 08/18/19  Yes Jacelyn Pi, Lilia Argue, MD  Multiple Vitamin (MULTIVITAMIN WITH MINERALS) TABS tablet Take 1 tablet by mouth daily.   Yes [provider]  XARELTO 20 MG TABS tablet TAKE 1 TABLET(20  MG) BY MOUTH DAILY WITH SUPPER 06/21/19  Yes Jacelyn Pi, Lilia Argue, MD   Allergies  Allergen Reactions  . Sulfa Antibiotics Palpitations   Review of Systems Does not verbalize Physical Exam Awake alert sitting up in bed Able to hold her head up, no neck muscle weakness Not able to smile Tracks me in the room, resting in bed comfortably no distress Does not verbalize Regular work of breathing Withdraws/scratches her on extremities when detailed examination is attempted  Vital Signs: BP 126/85   Pulse 72   Temp (!) 97.4 F (36.3 C) (Oral)   Resp 15   Ht 5\' 2"  (1.575 m)  Wt 50.3 kg   SpO2 100%   BMI 20.28 kg/m  Pain Scale: PAINAD   Pain Score: 0-No pain   SpO2: SpO2: 100 % O2 Device:SpO2: 100 % O2 Flow Rate: .   IO: Intake/output summary:   Intake/Output Summary (Last 24 hours) at 04/26/2020 0953 Last data filed at 04/26/2020 0500 Gross per 24 hour  Intake 240 ml  Output 350 ml  Net -110 ml    LBM: Last BM Date: 04/25/20 Baseline Weight: Weight: 53.5 kg Most recent weight: Weight: 50.3 kg     Palliative Assessment/Data:   PPS 40%  Time In:  8.30 Time Out:  9.30 Time Total:  60  Greater than 50%  of this time was spent counseling and coordinating care related to the above assessment and plan.  Signed by: Loistine Chance, MD   Please contact Palliative Medicine Team phone at [phone] for questions and concerns.  For individual provider: See Shea Evans

## 2020-04-26 NOTE — TOC Initial Note (Signed)
Transition of Care [facility]) - Initial/Assessment Note    Patient Details  Name: Tammy [name] MRN: [id number] Date of Birth: 09/27/1945  Transition of Care [facility]) CM/SW Contact:    Lynnell Catalan, RN Phone Number: 04/26/2020, 1:43 PM  Clinical Narrative:                 [facility] consult for home with hospice. Met with pt husband and daughter at bedside to offer choice for home hospice. [facility] chosen. Referral called in to [facility] liaison. Husband requesting hospital bed and over bed table. TOC will continue to follow and assist with DC.  Expected Discharge Plan: Home w Hospice Care Barriers to Discharge: Continued Medical Work up   Patient Goals and CMS Choice Patient states their goals for this hospitalization and ongoing recovery are:: Unable to respond, end stage dementia CMS Medicare.gov Compare Post Acute Care list provided to:: Patient Represenative (must comment) (Husband) Choice offered to / list presented to : Spouse  Expected Discharge Plan and Services Expected Discharge Plan: Home w Hospice Care   Discharge Planning Services: CM Consult Post Acute Care Choice: Hospice Living arrangements for the past 2 months: Single Family Home                           HH Arranged: Disease Management [place] Agency: [facility] [place] Date [facility] Agency Contacted: 04/26/20 Time HH Agency Contacted: 4 Representative spoke with at [place]: [place] Arrangements/Services Living arrangements for the past 2 months: [place] with:: Spouse          Need for Family Participation in Patient Care: Yes (Comment) Care giver support system in place?: Yes (comment)   Criminal Activity/Legal Involvement Pertinent to Current Situation/Hospitalization: No - Comment as needed  Activities of Daily Living Home Assistive Devices/Equipment: Other (Comment) (pt unable to answer) ADL Screening (condition at time of admission) Patient's  cognitive ability adequate to safely complete daily activities?: No Is the patient deaf or have difficulty hearing?: No Does the patient have difficulty seeing, even when wearing glasses/contacts?: No Does the patient have difficulty concentrating, remembering, or making decisions?: Yes Patient able to express need for assistance with ADLs?: No Does the patient have difficulty dressing or bathing?: Yes Independently performs ADLs?: No Communication: Independent Dressing (OT): Needs assistance Is this a change from baseline?: Pre-admission baseline Grooming: Needs assistance Is this a change from baseline?: Pre-admission baseline Feeding: Needs assistance Is this a change from baseline?: Pre-admission baseline Bathing: Needs assistance Is this a change from baseline?: Pre-admission baseline Toileting: Needs assistance Is this a change from baseline?: Pre-admission baseline In/Out Bed: Needs assistance Is this a change from baseline?: Pre-admission baseline Walks in Home: Dependent Is this a change from baseline?: Pre-admission baseline Does the patient have difficulty walking or climbing stairs?: Yes Weakness of Legs: Both Weakness of Arms/Hands: Both  Permission Sought/Granted                  Emotional Assessment Appearance:: Appears older than stated age Attitude/Demeanor/Rapport: Lethargic Affect (typically observed): Stoic   Alcohol / Substance Use: Not Applicable    Admission diagnosis:  Decubitus ulcer [L89.90] Wound infection [T14.8XXA, L08.9] Pressure injury of deep tissue of right hip [L89.216] Pressure injury of deep tissue of left hip [L89.226] Patient Active Problem List   Diagnosis Date Noted  . Decubitus ulcer 04/24/2020  . Acute-on-chronic kidney injury ([place]) 04/24/2020  . CKD (chronic kidney  disease)   . Seizure ([place]) 02/23/2019  . 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 Unintentional weight loss of more than 10 pounds in 90 days  10/19/2017  . Loss of appetite 07/08/2017  . Advanced care planning/counseling discussion 07/08/2017  . Acute pulmonary embolism ([place]) 03/07/2017  . Acute DVT (deep venous thrombosis) ([place]) 03/07/2017  . Normocytic anemia 03/07/2017  . History of deep vein thrombosis (DVT) of lower extremity 03/06/2017  . History of pulmonary embolism 03/06/2017  . Unintentional weight loss of more than 5% body weight within 1 month 02/23/2017  . Essential hypertension 12/31/2016  . Pelvic mass in female 03/11/2016  . Ovarian mass 02/24/2016  . Vitamin D deficiency 01/02/2015  . Prediabetes 01/02/2015  . Memory disturbance 12/28/2014  . Alzheimer's disease ([place]) 12/28/2014   PCP:  Jacelyn Pi, Lilia Argue, MD Pharmacy:   [facility] [place], [address] [place] [address] Phone: [phone] Fax: [phone]     Social Determinants of Health (SDOH) Interventions    Readmission Risk Interventions No flowsheet data found.

## 2020-04-27 MED ORDER — HYDROCODONE-ACETAMINOPHEN 5-325 MG PO TABS: 1.0000 | ORAL_TABLET | Freq: Three times a day (TID) | ORAL | 0 refills | Status: AC | PRN

## 2020-04-27 MED ORDER — COLLAGENASE 250 UNIT/GM EX OINT: TOPICAL_OINTMENT | Freq: Every day | CUTANEOUS | 0 refills | Status: AC

## 2020-04-27 NOTE — TOC Transition Note (Signed)
Transition of Care [facility]) - CM/SW Discharge Note   Patient Details  Name: NATAYLA CADENHEAD MRN: [id number] Date of Birth: [birth year]  Transition of Care [facility]) CM/SW Contact:  Lynnell Catalan, RN Phone Number: 04/27/2020, 12:09 PM   Clinical Narrative:    Pt to dc home with hospice today. Husband is at the home and is ready for her to arrive. [facility] contacted for transport. Yellow DNR on chart for transport.   Final next level of care: Home w Hospice Care Barriers to Discharge: Continued Medical Work up   Patient Goals and CMS Choice Patient states their goals for this hospitalization and ongoing recovery are:: Unable to respond, end stage dementia CMS Medicare.gov Compare Post Acute Care list provided to:: Patient Represenative (must comment) (Husband) Choice offered to / list presented to : Spouse    Discharge Plan and Services   Discharge Planning Services: CM Consult Post Acute Care Choice: Hospice                    HH Arranged: Disease Management [place] Agency: [facility] [place] Date [place]: 04/26/20 Time HH Agency Contacted: 1300 Representative spoke with at [place]: [place] ([place]) Interventions     Readmission Risk Interventions No flowsheet data found.

## 2020-04-27 NOTE — Progress Notes (Addendum)
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 X-ray of the hips showed slight indistinct appearance of the cortex of trochanter, questionable early osteomyelitis change, unable to get MRI to confirm. 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 X-ray of the hips showed slight indistinct appearance of the cortex of trochanter, questionable early osteomyelitis change, unable to get MRI to confirm.  Patient and Family Stated Goals pt unable to state  Date of Onset  (unknown)  Prior Treatments unknown  Evaluation and Treatment  Evaluation and Treatment Procedures Explained to Patient/Family Yes  Evaluation and Treatment Procedures Other (comment);agreed to (pt noded and stating "ok" however pt with significant hx of Alzheimer's dimentia)  Pressure Injury 04/26/20 Hip Right;Lateral Unstageable - Full thickness tissue loss in which the base of the injury is covered by slough (yellow, tan, gray, green or brown) and/or eschar (tan, brown or black) in the wound bed. PT HYDROTHERAPY ONLY (Rt gr  Date First Assessed/Time First Assessed: 04/26/20 1400   Location: Hip  Location Orientation: Right;Lateral  Staging: Unstageable - Full thickness tissue loss in which the base of the injury is covered by slough (yellow, tan, gray, green or  brown) and...  Dressing Type Gauze (Comment);ABD;Santyl;Tape dressing  Dressing Changed  Dressing Change Frequency PRN  State of Healing Non-healing  Site / Wound Assessment Brown;Yellow;Pink  % Wound base Red or Granulating 0%  % Wound base Yellow/Fibrinous Exudate 95%  % Wound base Other/Granulation Tissue (Comment) 5% (pale pink tissue)  Peri-wound Assessment Intact  Wound Length (cm) 6.9 cm  Wound Width (cm) 5.4 cm  Wound Depth (cm) 1 cm  Wound Surface Area (cm^2) 37.26 cm^2  Wound Volume (cm^3) 37.26 cm^3  Tunneling (cm) unknown  Undermining (cm) unknown  Margins Unattached edges (unapproximated)  Drainage Amount Minimal  Drainage Description Serosanguineous  Treatment Cleansed;Debridement (Selective);Hydrotherapy (Pulse lavage);Tape changed  Hydrotherapy  Pulsed Lavage with Suction (psi) 10 psi  Pulsed Lavage with Suction - Normal Saline Used 1000 mL  Pulsed Lavage Tip Tip with splash shield  Pulsed lavage therapy - wound location Rt greater trochanter wound bed  Selective Debridement  Selective Debridement - Location Rt great troch wound bed  Selective Debridement - Tools Used Forceps;Scissors  Selective Debridement - Tissue Removed slough, devitalized necrotic tissue  Wound Therapy - Assess/Plan/Recommendations  Wound Therapy - Clinical Statement Patient is 75 y.o. female presented to hospital from home for Rt hip ulcer. Discussed care of pt wtih MD (Dr. Rowe Pavy & Dr. Darrick Meigs) who feel it would be beneficial to attempt hydrotherapy evaluation and treatment to improve environment of Rt hip ulcer. Pt's wound is painful and has moderately adheared yellow/pale slough in 100% of wound bed. Plused lavage completed with pt toelrating fairly well, this softened slough and allowed for some sharps debridement to wound bed. Large portion of superficial slough removed however wound bed remained primarily 95% slough with 5%  pale pink tissue. Wound dressed with santyl and saline moist gauze and  ABD pad applied to provide absorptive laying for drainage. Patient is likely to go home with hospice services and will not recieve wound care at home. Will follow in acute setting if pt can tolerate treatments to cleanse and debridge as much necrotic tissue as able to reduce risk of worseing infection or progression of wound.  Wound Therapy - Functional Problem List severe Alzheimer's disease, immobility and contractures. poor nutritional intake  Factors Delaying/Impairing Wound Healing Incontinence;Multiple medical problems;Immobility  Hydrotherapy Plan Debridement;Dressing change;Patient/family education;Pulsatile lavage with suction  Wound Therapy - Frequency 6X / week (up to 6x/week or as tolerated by pt)  Wound Therapy - Current Recommendations Other (comment) (pt going home on hospice, could use home wound care)  Wound Therapy - Follow Up Recommendations f/u pulsed lavage with suction;f/u selective debridement;dressing changes by RN  Wound Therapy Goals - Improve the function of patient's integumentary system by progressing the wound(s) through the phases of wound healing by:  Decrease Necrotic Tissue to 50% or less of wound bed  Decrease Necrotic Tissue - Progress Goal set today  Decrease Length/Width/Depth by (cm) woudn depth to decrease to 0.8 cm or less.  Decrease Length/Width/Depth - Progress Goal set today  Time For Goal Achievement 2 weeks  Wound Therapy - Potential for Goals Fair    Hydrotherapy Pulsed lavage therapy - wound location: Rt greater trochanter wound bed Pulsed Lavage with Suction (psi): 10 psi Pulsed Lavage with Suction - Normal Saline Used: 1000 mL Pulsed Lavage Tip: Tip with splash shield Selective Debridement Selective Debridement - Location: Rt great troch wound bed Selective Debridement - Tools Used: Forceps,Scissors Selective Debridement - Tissue Removed: slough, devitalized necrotic tissue    Wound Assessment and Plan  Wound Therapy -  Assess/Plan/Recommendations Wound Therapy - Clinical Statement: (P) Patient is 75 y.o. female presented to hospital from home for Rt hip ulcer. Discussed care of pt wtih MD (Dr. Rowe Pavy & Dr. Darrick Meigs) who feel it would be beneficial to attempt hydrotherapy evaluation and treatment to improve environment of Rt hip ulcer. Pt's wound is painful and has moderately adheared yellow/pale slough in 100% of wound bed. Plused lavage completed with pt toelrating fairly well, this softened slough and allowed for some sharps debridement to wound bed. Large portion of superficial slough removed however wound bed remained primarily 95% slough with 5% pale pink tissue. Wound dressed with santyl and saline moist gauze and ABD pad applied to provide absorptive laying for drainage. Patient is likely to go home with hospice services and will not recieve wound care at home. Will follow in acute setting if pt can tolerate treatments to cleanse and debridge as much necrotic tissue as able to reduce risk of worseing infection or progression of wound. Wound Therapy - Functional Problem List: (P) severe Alzheimer's disease, immobility and contractures. poor nutritional intake Factors Delaying/Impairing Wound Healing: Incontinence,Multiple medical problems,Immobility Hydrotherapy Plan: Debridement,Dressing change,Patient/family education,Pulsatile lavage with suction Wound Therapy - Frequency: 6X / week (up to 6x/week or as tolerated by pt) Wound Therapy - Current Recommendations: Other (comment) (pt going home on hospice, could use home wound care) Wound Therapy - Follow Up Recommendations: f/u pulsed lavage with suction,f/u selective debridement,dressing changes by RN  Wound Therapy Goals- Improve the function of patient's integumentary system by progressing the wound(s) through the phases of wound healing (inflammation - proliferation - remodeling) by: Wound Therapy Goals - Improve the function of patient's integumentary system by  progressing the  wound(s) through the phases of wound healing by: Decrease Necrotic Tissue to: 50% or less of wound bed Decrease Necrotic Tissue - Progress: Goal set today Decrease Length/Width/Depth by (cm): woudn depth to decrease to 0.8 cm or less. Decrease Length/Width/Depth - Progress: Goal set today Time For Goal Achievement: 2 weeks Wound Therapy - Potential for Goals: Fair  Goals will be updated until maximal potential achieved or discharge criteria met.  Discharge criteria: when goals achieved, discharge from hospital, MD decision/surgical intervention, no progress towards goals, refusal/missing three consecutive treatments without notification or medical reason.  GP     Charges   04/26/20 1900  PT Wound Care Charges  $Wound Debridement up to 20 cm < or equal to 20 cm  $ Wound Debridement each add'l 20 sqcm 1  $PT Hydrotherapy Dressing 1 dressing  $PT PLS Gun and Tip 1 Supply  $PT Hydrotherapy Visit 1 Visit    Verner Mould, DPT Acute Rehabilitation Services Office [phone] Pager [phone]

## 2020-04-27 NOTE — Discharge Summary (Addendum)
Physician Discharge Summary  ARFA LAMARCA PPJ:[id number] DOB: 07/29/1945 DOA: 04/24/2020  PCP: Jacelyn Pi, Lilia Argue, MD  Admit date: 04/24/2020 Discharge date: 04/27/2020  Time spent: 50 minutes  Recommendations for Outpatient Follow-up:  1. 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  In the ED she was found to have heart rate in low 100s, blood pressure 111 x 86, lactic acid 2.1, CRP 11.9.  Also found to have ulcers in bilateral hip/trochanteric areas with right 1 being worse than left.  X-ray of the hips showed slight indistinct appearance of the cortex of trochanter, questionable early osteomyelitis change.  Right hip x-ray showed large amount of stool in the rectum.  MRI of the hips was recommended however patient did not get MRI as she was not cooperative.  Dr. Wynelle Cleveland discussed with patient's husband regarding poor prognosis, palliative care was consulted.  Patient has been made full comfort care only.  Plan to go home with hospice.   Hospital Course:   1. Decubitus ulcer-MRI of the pelvis was ordered however could not be performed as patient was not cooperative.  Hydrotherapy was ordered.  Dr. Wynelle Cleveland discussed with patient's husband regarding poor prognosis given severity of Alzheimer's disease, immobility and contractures.  Also  poor quality of life was discussed.  Palliative care was consulted.  Patient has been made full comfort care.  Plan to go home with hospice. 2. Seizure disorder-continue Keppra 3. Alzheimer's disease-patient has severe dementia as mentioned above.  Poor quality of life and poor prognosis. 4. History of DVT/pulmonary embolism-we will discontinue Xarelto as risk of bleeding is higher than any benefit.  Discussed with palliative care. 5. Plan to go home with hospice.   Procedures:    Consultations:    Discharge Exam: Vitals:   04/26/20 1955 04/27/20 0637  BP: (!) 128/109 (!) 124/93  Pulse: (!) 57 79  Resp: 18 18  Temp: 97.8 F (36.6 C)   SpO2: 97% 100%    General: Appears in no acute distress Cardiovascular: S1-S2, regular Respiratory: Clear to auscultation bilaterally  Discharge Instructions   Discharge Instructions    Diet - low sodium heart healthy   Complete by: As directed    Discharge wound care:   Complete by: As directed    Wound care to bilateral hip Unstageable pressure injuries: Cleanse with NS, pat dry. Apply collagenase (Santyl) to bilateral hip Unstageable pressure injuries in a 1/8 inch layer, top with saline moistened gauze dressing. Top with dry gauze, ABD to right hip and secure with Medipore tape.   Increase activity slowly   Complete by: As directed      Allergies as of 04/27/2020      Reactions   Sulfa Antibiotics Palpitations      Medication List    STOP taking these medications   Xarelto 20  MG Tabs tablet Generic drug: rivaroxaban     TAKE these medications   collagenase ointment Commonly known as: SANTYL Apply topically daily. Both hip ulcers Start taking on: April 28, 2020   HYDROcodone-acetaminophen 5-325 MG tablet Commonly known as: NORCO/VICODIN Take 1 tablet by mouth every 8 (eight) hours as needed for moderate pain.   levETIRAcetam 500 MG tablet Commonly known as: Keppra Take 1 tablet (500 mg total) by mouth 2 (two) times  daily.   multivitamin with minerals Tabs tablet Take 1 tablet by mouth daily.            Discharge Care Instructions  (From admission, onward)         Start     Ordered   04/27/20 0000  Discharge wound care:       Comments: Wound care to bilateral hip Unstageable pressure injuries: Cleanse with NS, pat dry. Apply collagenase (Santyl) to bilateral hip Unstageable pressure injuries in a 1/8 inch layer, top with saline moistened gauze dressing. Top with dry gauze, ABD to right hip and secure with Medipore tape.   04/27/20 1113         Allergies  Allergen Reactions  . Sulfa Antibiotics Palpitations      The results of significant diagnostics from this hospitalization (including imaging, microbiology, ancillary and laboratory) are listed below for reference.    Significant Diagnostic Studies: MR PELVIS WO CONTRAST  Result Date: 04/25/2020 CLINICAL DATA:  Bilateral hip ulcers EXAM: MRI PELVIS WITHOUT CONTRAST TECHNIQUE: Multiplanar multisequence MR imaging of the pelvis was performed. No intravenous contrast was administered. COMPARISON:  X-ray 04/24/2020 FINDINGS: Nondiagnostic exam. Patient terminated the examination during initial sequence acquisition. A single motion degraded coronal series through the pelvis demonstrates no obvious acute findings. There is a very large rectal stool ball measuring 9 cm in diameter. IMPRESSION: 1. Nondiagnostic exam. Only a single motion degraded coronal series through the pelvis was able to be obtained which demonstrates no obvious acute findings. Consider CT further evaluation or possibly repeat MRI when patient is able to better tolerate exam. 2. Very large rectal stool ball measuring 9 cm in diameter. Electronically Signed   By: Davina Poke D.O.   On: 04/25/2020 12:01   DG Hip [place] W or [place] Pelvis 1 View Right  Result Date: 04/24/2020 CLINICAL DATA:  Right hip pain and cutaneous sore. EXAM: DG HIP (WITH OR WITHOUT PELVIS) 1V PORT  RIGHT COMPARISON:  Abdomen and pelvis CT dated 11/09/2016. FINDINGS: Normal appearing right hip. No visible soft tissue abnormality. Large amount of stool in the rectum. Moderate stool in the remainder of the included portions of the colon. Mild lower lumbar spine degenerative changes. IMPRESSION: 1. Normal appearing right hip. 2. Large amount of stool in the rectum. Electronically Signed   By: Claudie Revering M.D.   On: 04/24/2020 15:04   DG Hip Unilat W or Wo Pelvis 2-3 Views Left  Result Date: 04/24/2020 CLINICAL DATA:  Skin ulcer over greater trochanter EXAM: DG HIP (WITH OR WITHOUT PELVIS) 2-3V LEFT COMPARISON:  09/02/2007 FINDINGS: No fracture or malalignment. Slightly indistinct appearance of the cortex at the trochanter. No frank bony destructive change. No soft tissue emphysema. IMPRESSION: Slightly indistinct appearance of the cortex at the trochanter questionable for early osteomyelitis change. Electronically Signed   By: Donavan Foil M.D.   On: 04/24/2020 17:30    Microbiology: Recent Results (from the past 240 hour(s))  Culture, blood (routine x 2)     Status: None (Preliminary result)  Collection Time: 04/24/20  4:42 PM   Specimen: BLOOD LEFT FOREARM  Result Value Ref Range Status   Specimen Description   Final    BLOOD LEFT FOREARM Performed at [facility] [address] [place], [place] [address]    Special Requests   Final    BOTTLES DRAWN AEROBIC AND ANAEROBIC Blood Culture adequate volume Performed at [facility] [address], [place], [place] [address]    Culture   Final    NO GROWTH 3 DAYS Performed at [facility], [place] [address] [place], [place] [address]    Report Status PENDING  Incomplete  Culture, blood (routine x 2)     Status: None (Preliminary result)   Collection Time: 04/24/20  4:42 PM   Specimen: BLOOD  Result Value Ref Range Status   Specimen Description   Final    BLOOD RIGHT ANTECUBITAL Performed at  [facility] [address], [place], [place] [address]    Special Requests   Final    BOTTLES DRAWN AEROBIC AND ANAEROBIC Blood Culture results may not be optimal due to an excessive volume of blood received in culture bottles Performed at [facility] [address], [place], [place] [address]    Culture   Final    NO GROWTH 3 DAYS Performed at [facility], [place] [address], [place], [place] [address]    Report Status PENDING  Incomplete  SARS CORONAVIRUS 2 (TAT 6-24 HRS) Nasopharyngeal Nasopharyngeal Swab     Status: None   Collection Time: 04/24/20  7:53 PM   Specimen: Nasopharyngeal Swab  Result Value Ref Range Status   SARS Coronavirus 2 NEGATIVE NEGATIVE Final    Comment: (NOTE) SARS-CoV-2 target nucleic acids are NOT DETECTED.  The SARS-CoV-2 RNA is generally detectable in upper and lower respiratory specimens during the acute phase of infection. Negative results do not preclude SARS-CoV-2 infection, do not rule out co-infections with other pathogens, and should not be used as the sole basis for treatment or other patient management decisions. Negative results must be combined with clinical observations, patient history, and epidemiological information. The expected result is Negative.  Fact Sheet for Patients: SugarRoll.be  Fact Sheet for Healthcare Providers: https://www.woods-mathews.com/  This test is not yet approved or cleared by the [place] FDA and  has been authorized for detection and/or diagnosis of SARS-CoV-2 by FDA under an Emergency Use Authorization (EUA). This EUA will remain  in effect (meaning this test can be used) for the duration of the COVID-19 declaration under Se ction 564(b)(1) of the Act, 21 U.S.C. section 360bbb-3(b)(1), unless the authorization is terminated or revoked sooner.  Performed at [facility], [place] [address], [place],   [address]      Labs: Basic Metabolic Panel: Recent Labs  Lab 04/24/20 1518 04/25/20 0444  NA 151* 151*  K 4.2 3.7  CL 119* 119*  CO2 23 24  GLUCOSE 107* 108*  BUN 33* 26*  CREATININE 1.10* 0.82  CALCIUM 9.2 9.0   Liver Function Tests: Recent Labs  Lab 04/24/20 1518  AST 27  ALT 21  ALKPHOS 73  BILITOT 0.4  PROT 7.6  ALBUMIN 2.9*   No results for input(s): LIPASE, AMYLASE in the last 168 hours. No results for input(s): AMMONIA in the last 168 hours. CBC: Recent Labs  Lab 04/24/20 1518 04/25/20 0444  WBC 12.0* 10.4  NEUTROABS 9.7*  --   HGB 10.8* 9.6*  HCT 35.6* 30.4*  MCV 96.7 91.3  PLT 435* 431*       Signed:  Oswald Hillock MD.  [facility] 04/27/2020, 11:14 AM

## 2020-04-29 LAB — CULTURE, BLOOD (ROUTINE X 2)
Culture: NO GROWTH
Culture: NO GROWTH
Special Requests: ADEQUATE

## 2020-06-13 DEATH — deceased
